# Patient Record
Sex: Female | Born: 1945
Health system: Southern US, Community
[De-identification: ages and names within clinical notes are randomized; demographics above are authoritative.]

## PROBLEM LIST (undated history)

## (undated) DIAGNOSIS — M1990 Unspecified osteoarthritis, unspecified site: Secondary | ICD-10-CM

## (undated) DIAGNOSIS — J45909 Unspecified asthma, uncomplicated: Secondary | ICD-10-CM

## (undated) DIAGNOSIS — K219 Gastro-esophageal reflux disease without esophagitis: Secondary | ICD-10-CM

## (undated) DIAGNOSIS — F32A Depression, unspecified: Secondary | ICD-10-CM

## (undated) DIAGNOSIS — I10 Essential (primary) hypertension: Secondary | ICD-10-CM

## (undated) DIAGNOSIS — E079 Disorder of thyroid, unspecified: Secondary | ICD-10-CM

## (undated) DIAGNOSIS — F329 Major depressive disorder, single episode, unspecified: Secondary | ICD-10-CM

## (undated) DIAGNOSIS — M797 Fibromyalgia: Secondary | ICD-10-CM

## (undated) DIAGNOSIS — E039 Hypothyroidism, unspecified: Secondary | ICD-10-CM

## (undated) DIAGNOSIS — J449 Chronic obstructive pulmonary disease, unspecified: Secondary | ICD-10-CM

## (undated) HISTORY — PX: NECK SURGERY: SHX720

## (undated) HISTORY — PX: ABDOMINAL HYSTERECTOMY: SHX81

## (undated) HISTORY — PX: CARPAL TUNNEL RELEASE: SHX101

## (undated) HISTORY — PX: KNEE ARTHROSCOPY: SUR90

## (undated) HISTORY — PX: CHOLECYSTECTOMY: SHX55

## (undated) HISTORY — PX: EYE SURGERY: SHX253

## (undated) HISTORY — PX: BACK SURGERY: SHX140

## (undated) HISTORY — PX: TUBAL LIGATION: SHX77

---

## 2000-05-20 ENCOUNTER — Encounter: Payer: Self-pay | Admitting: Neurological Surgery

## 2000-05-24 ENCOUNTER — Observation Stay (HOSPITAL_COMMUNITY): Admission: RE | Admit: 2000-05-24 | Discharge: 2000-05-25 | Payer: Self-pay | Admitting: Neurological Surgery

## 2000-05-24 ENCOUNTER — Encounter: Payer: Self-pay | Admitting: Neurological Surgery

## 2000-06-10 ENCOUNTER — Encounter: Payer: Self-pay | Admitting: Neurological Surgery

## 2000-06-10 ENCOUNTER — Encounter: Admission: RE | Admit: 2000-06-10 | Discharge: 2000-06-10 | Payer: Self-pay | Admitting: Neurological Surgery

## 2007-03-24 ENCOUNTER — Ambulatory Visit: Payer: Self-pay | Admitting: Cardiology

## 2008-11-09 ENCOUNTER — Emergency Department (HOSPITAL_COMMUNITY): Admission: EM | Admit: 2008-11-09 | Discharge: 2008-11-09 | Payer: Self-pay | Admitting: Emergency Medicine

## 2009-11-21 ENCOUNTER — Ambulatory Visit (HOSPITAL_COMMUNITY)
Admission: RE | Admit: 2009-11-21 | Discharge: 2009-11-21 | Payer: Self-pay | Source: Home / Self Care | Admitting: Neurological Surgery

## 2010-04-09 ENCOUNTER — Ambulatory Visit (HOSPITAL_COMMUNITY)
Admission: RE | Admit: 2010-04-09 | Discharge: 2010-04-09 | Payer: Self-pay | Source: Home / Self Care | Attending: Gastroenterology | Admitting: Gastroenterology

## 2010-07-29 NOTE — Assessment & Plan Note (Signed)
[facility]                          [facility] NOTE   NAME:Alexis Ortega, Alexis Ortega                       MRN:          [id number]  DATE:03/24/2007                            DOB:          August 15, 1945    HISTORY OF PRESENT ILLNESS:  The patient is Ortega pleasant 65 year old  female who came in with her mother yesterday in the clinic and was  complaining of chest pain, please see my brief dictated note regarding  his visit yesterday.  In essence, the patient states that over the last  2 weeks she has had increased shortness of breath which has been  particularly associated with coughing although it has been non-purulent  with Ortega dry hacking cough.  She stated that several weeks ago she did run  Ortega low grade fever and over the last 2 days had some chills.  She reports  left-sided chest pain associated with cough but yesterday had an episode  of substernal chest pain which was persistent for several hours, see my  note yesterday.  An EKG was obtained during this episode and there were  no electrocardiographic changes.  The patient does state that since  Christmas she has been feeling rather weak and has had decreased  exercise tolerance.  She did stop smoking around that time and is now  wearing Ortega Nicoderm patch.  She has no orthopnea or PND.  She has  otherwise no palpitations or syncope.   ALLERGIES:  NO KNOWN DRUG ALLERGIES.   MEDICATIONS:  1. Levothyroxine 100 mcg p.o. daily.  2. Citalopram 20 mg p.o. daily.  3. Exforge 5/160 mg p.o. daily.  4. Ranitidine 300 mg p.o. daily.  5. Aspirin 81 mg Ortega day.  6. Calcium.  7. Vitamin E.   PAST MEDICAL HISTORY:  1. Hypertension.  2. Arthritis.  3. Thyroid disease.  4. Anxiety.  5. Multiple surgeries including back surgery, right carpal tunnel      release,  BTL and partial hysterectomy as well as cholecystectomy.   FAMILY HISTORY:  Notable for her father died from myocardial infarction,  mother is alive  but has coronary artery disease and has diabetes  mellitus.  She has 2 brothers, both have coronary artery disease.   SOCIAL HISTORY:  The patient lives in [place].  She stopped smoking around  Christmas.  She is currently unemployed.   REVIEW OF SYSTEMS:  As per HPI.  No nausea, vomiting, no melena or  hematochezia, no dysuria or frequency.  No syncope, no neurological  symptoms.  Remainder of review of systems is positive as outlined above.   PHYSICAL EXAMINATION:  VITAL SIGNS:  Blood pressure is 103/64, heart  rate is 65 beats per minute.  NECK:  Normal carotid upstroke, no carotid bruits.  LUNGS:  Diminished breath sounds bilaterally.  HEART:  Regular rate and rhythm, normal S1-S2.  ABDOMEN:  Soft, nontender, no rebound or guarding.  Good bowel sounds.  EXTREMITIES:  No cyanosis, clubbing or edema.  NEURO:  Patient alert, oriented and grossly nonfocal.   Chest x-ray done yesterday demonstrated changes consistent with COPD but  no acute infiltrate.  Ortega 12 lead electrocardiogram done yesterday during  her pain episode showed no acute changes.   PROBLEM LIST:  1. Atypical chest pain, likely musculoskeletal  2. Chronic obstructive pulmonary disease, possible bronchitis.  3. Rule out ischemic heart disease (multiple cardiac risk factors).  4. History of hypothyroidism.  5. History of gastroesophageal reflux disease.  6. History of hypertension, stable.   PLAN:  1. The patient's chest pains is atypical.  She has 2 chest pain      syndromes.  She refers to Ortega left-sided chest pain which is clearly      worse with coughing but also had Ortega pain episode yesterday which was      persistent but nonexertional related.  Given her risk factor      profile we will proceed with an exercise stress test which can be      done in the next couple of days.  I have also given the patient      prescription for p.r.n. nitroglycerin.  2. The patient's left-sided chest pain appears to be musculoskeletal       and is likely related to Ortega recent episode of bronchitis.  The      patient does have COPD by chest x-ray and we will go ahead and      treat her with antibiotics including trimethoprim sulfamethoxazole      with an inhaler, Atrovent as well as Motrin p.r.n. for      musculoskeletal pain.  3. The patient can follow up with [place] in the next 3 months or earlier      if she has any questions or concerns.     Learta Codding, MD,FACC  Electronically Signed    GED/MedQ  DD: 03/24/2007  DT: 03/24/2007  Job #: [id number]   cc:   Doreen Beam, MD

## 2010-07-29 NOTE — Assessment & Plan Note (Signed)
[facility]                          [facility] NOTE   NAME:Alexis Ortega, Alexis Ortega                       MRN:          [id number]  DATE:03/23/2007                            DOB:          [birth year]    The patient is daughter of Mrs. [name] whom I saw in the office  today.  The patient states that, for the last several days, she has not  been feeling well.  She has been feeling weak.  Has had a cough and is  complaining of some left-sided chest pain, which appears to be worse  with deep inspiration, less so with exertion.  The patient is concerned  about this and has to tried to call Dr. Sherril Croon but could not secure an  appointment in the office.  She mentioned all this to me while I was  examining her mother.  We did obtain an electrocardiogram which was  entirely normal with a normal sinus rhythm and no EKG changes suggestive  of ischemia.  I gave the patient the option to see me in the office  tomorrow or if she has worsening problems tonight, to be seen in the  emergency room.  The patient has given preference to be seen at the  [facility] Emergency Room.  Given her symptomatology, however, we will  proceed with a chest x-ray today and then further evaluation in the  morning.  Again I have explained to the patient very carefully that she  if is uncomfortable and has worsening pain, she needs to be seen in  emergency room.     Learta Codding, MD,FACC  Electronically Signed    GED/MedQ  DD: 03/23/2007  DT: 03/23/2007  Job #: [id number]

## 2010-08-01 NOTE — Op Note (Signed)
. [facility]  Patient:    Alexis Ortega, Alexis Ortega                       MRN: [id number] Proc. Date: 05/24/00 Adm. Date:  [id number] Attending:  Jonne Ply                           Operative Report  PREOPERATIVE DIAGNOSIS:  C6-7 spondylosis and herniated nucleus pulposus with left cervical radiculopathy.  POSTOPERATIVE DIAGNOSIS:  C6-7 spondylosis and herniated nucleus pulposus with left cervical radiculopathy.  PROCEDURE:  Anterior cervical diskectomy and arthrodesis C6-7, structural allograft, Synthes fixation.  SURGEON:  Stefani Dama, M.D.  FIRST ASSISTANT:  Danae Orleans. Venetia Maxon, M.D.  ANESTHESIA:  General endotracheal.  INDICATIONS:  The patient is a 65 year old individual who has had significant cervical neck pain and left shoulder and arm pain.  She has a C7 radiculopathy clinically and has a spondylitic disk with a small subligamentous herniation at that level.  DESCRIPTION OF PROCEDURE:  The patient was brought to the operating room and placed on the table in supine position.  After a smooth induction of general endotracheal anesthesia, she was placed in five pounds of Holter traction. The neck was prepped with Duraprep and draped in a sterile fashion.  A transverse incision was made in the left side of the neck at the base and carried down through the platysma.  The plane between the sternocleidomastoid and the strap muscles was dissected bluntly until the prevertebral space was reached.  The first identifiable disk space was noted to be that of C6-7. This was localized positively on radiograph.  A diskectomy was then performed, opening the anterior longitudinal ligament, using some rongeurs to remove some ventral osteophytes.  The disk was noted to be severely desiccated, and its removal occurred easily.  A self-retaining disk spreader was placed in the wound, and then the posterior longitudinal ligament area was cleared.  On  the right side, there was noted to be a significant lateral osteophyte.  This was cleared with an Anspach drill and a 2.3 mm matchstick dissector.  The lateral recess was cleared completely on the right side, and then attention was turned to the left side, where there was a subligamentous disk herniation noted. This was resected, the ligament was opened, the lateral recess was cleared similarly, and a moderate-sized uncinate process spur was removed.  Once this was cleared, a 7 mm tricortical iliac crest graft was fashioned into the appropriate size and shape and then placed into the interspace.  The traction was removed, and the neck was placed in slight flexion.  An 18 mm standard Synthes plate was affixed with four locking 4 x 14 mm screws.  The area was irrigated copiously with antibiotic irrigating solution.  Localizing radiograph identified good position of the graft and the fixation, and then the platysma was closed with 3-0 Vicryl in interrupted fashion, and 3-0 Vicryl was used to close the subcuticular skin.  The patient tolerated the procedure well and was returned to the recovery room in stable condition. DD:  05/24/00 TD:  05/25/00 Job: [id number]

## 2011-03-18 DIAGNOSIS — M79609 Pain in unspecified limb: Secondary | ICD-10-CM | POA: Diagnosis not present

## 2011-03-18 DIAGNOSIS — IMO0002 Reserved for concepts with insufficient information to code with codable children: Secondary | ICD-10-CM | POA: Diagnosis not present

## 2011-04-27 DIAGNOSIS — S8000XA Contusion of unspecified knee, initial encounter: Secondary | ICD-10-CM | POA: Diagnosis not present

## 2011-04-27 DIAGNOSIS — M5430 Sciatica, unspecified side: Secondary | ICD-10-CM | POA: Diagnosis not present

## 2011-04-29 DIAGNOSIS — M5430 Sciatica, unspecified side: Secondary | ICD-10-CM | POA: Diagnosis not present

## 2011-05-08 DIAGNOSIS — IMO0002 Reserved for concepts with insufficient information to code with codable children: Secondary | ICD-10-CM | POA: Diagnosis not present

## 2011-07-08 DIAGNOSIS — I10 Essential (primary) hypertension: Secondary | ICD-10-CM | POA: Diagnosis not present

## 2011-07-08 DIAGNOSIS — R350 Frequency of micturition: Secondary | ICD-10-CM | POA: Diagnosis not present

## 2011-07-08 DIAGNOSIS — R82998 Other abnormal findings in urine: Secondary | ICD-10-CM | POA: Diagnosis not present

## 2011-07-08 DIAGNOSIS — L905 Scar conditions and fibrosis of skin: Secondary | ICD-10-CM | POA: Diagnosis not present

## 2011-07-08 DIAGNOSIS — D485 Neoplasm of uncertain behavior of skin: Secondary | ICD-10-CM | POA: Diagnosis not present

## 2011-07-08 DIAGNOSIS — L821 Other seborrheic keratosis: Secondary | ICD-10-CM | POA: Diagnosis not present

## 2011-07-08 DIAGNOSIS — E039 Hypothyroidism, unspecified: Secondary | ICD-10-CM | POA: Diagnosis not present

## 2011-07-08 DIAGNOSIS — D179 Benign lipomatous neoplasm, unspecified: Secondary | ICD-10-CM | POA: Diagnosis not present

## 2011-07-15 DIAGNOSIS — F17200 Nicotine dependence, unspecified, uncomplicated: Secondary | ICD-10-CM | POA: Diagnosis not present

## 2011-07-15 DIAGNOSIS — M899 Disorder of bone, unspecified: Secondary | ICD-10-CM | POA: Diagnosis not present

## 2011-07-15 DIAGNOSIS — F341 Dysthymic disorder: Secondary | ICD-10-CM | POA: Diagnosis not present

## 2011-07-15 DIAGNOSIS — Z0000 Encounter for general adult medical examination without abnormal findings: Secondary | ICD-10-CM | POA: Diagnosis not present

## 2011-07-15 DIAGNOSIS — I10 Essential (primary) hypertension: Secondary | ICD-10-CM | POA: Diagnosis not present

## 2011-09-14 DIAGNOSIS — E039 Hypothyroidism, unspecified: Secondary | ICD-10-CM | POA: Diagnosis not present

## 2011-10-07 DIAGNOSIS — M999 Biomechanical lesion, unspecified: Secondary | ICD-10-CM | POA: Diagnosis not present

## 2011-10-07 DIAGNOSIS — M5430 Sciatica, unspecified side: Secondary | ICD-10-CM | POA: Diagnosis not present

## 2011-10-07 DIAGNOSIS — S335XXA Sprain of ligaments of lumbar spine, initial encounter: Secondary | ICD-10-CM | POA: Diagnosis not present

## 2011-10-08 DIAGNOSIS — M999 Biomechanical lesion, unspecified: Secondary | ICD-10-CM | POA: Diagnosis not present

## 2011-10-08 DIAGNOSIS — S335XXA Sprain of ligaments of lumbar spine, initial encounter: Secondary | ICD-10-CM | POA: Diagnosis not present

## 2011-10-08 DIAGNOSIS — R42 Dizziness and giddiness: Secondary | ICD-10-CM | POA: Diagnosis not present

## 2011-10-08 DIAGNOSIS — M5430 Sciatica, unspecified side: Secondary | ICD-10-CM | POA: Diagnosis not present

## 2011-10-08 DIAGNOSIS — IMO0002 Reserved for concepts with insufficient information to code with codable children: Secondary | ICD-10-CM | POA: Diagnosis not present

## 2011-10-08 DIAGNOSIS — I10 Essential (primary) hypertension: Secondary | ICD-10-CM | POA: Diagnosis not present

## 2011-10-08 DIAGNOSIS — F341 Dysthymic disorder: Secondary | ICD-10-CM | POA: Diagnosis not present

## 2011-10-12 ENCOUNTER — Encounter (HOSPITAL_COMMUNITY): Payer: Self-pay | Admitting: *Deleted

## 2011-10-12 ENCOUNTER — Emergency Department (HOSPITAL_COMMUNITY): Payer: Medicare Other

## 2011-10-12 ENCOUNTER — Emergency Department (HOSPITAL_COMMUNITY)
Admission: EM | Admit: 2011-10-12 | Discharge: 2011-10-12 | Disposition: A | Discharge status: Home / Self Care | Payer: Medicare Other | Attending: Emergency Medicine | Admitting: Emergency Medicine

## 2011-10-12 DIAGNOSIS — M549 Dorsalgia, unspecified: Secondary | ICD-10-CM

## 2011-10-12 DIAGNOSIS — R51 Headache: Secondary | ICD-10-CM | POA: Diagnosis not present

## 2011-10-12 DIAGNOSIS — R42 Dizziness and giddiness: Secondary | ICD-10-CM | POA: Diagnosis not present

## 2011-10-12 DIAGNOSIS — R079 Chest pain, unspecified: Secondary | ICD-10-CM | POA: Diagnosis not present

## 2011-10-12 DIAGNOSIS — R0789 Other chest pain: Secondary | ICD-10-CM | POA: Diagnosis not present

## 2011-10-12 DIAGNOSIS — R030 Elevated blood-pressure reading, without diagnosis of hypertension: Secondary | ICD-10-CM | POA: Insufficient documentation

## 2011-10-12 DIAGNOSIS — M545 Low back pain: Secondary | ICD-10-CM | POA: Diagnosis not present

## 2011-10-12 HISTORY — DX: Disorder of thyroid, unspecified: E07.9

## 2011-10-12 HISTORY — DX: Essential (primary) hypertension: I10

## 2011-10-12 LAB — POCT I-STAT TROPONIN I: Troponin i, poc: 0 ng/mL (ref 0.00–0.08)

## 2011-10-12 LAB — CBC WITH DIFFERENTIAL/PLATELET
Basophils Absolute: 0.1 10*3/uL (ref 0.0–0.1)
Basophils Relative: 1 % (ref 0–1)
Eosinophils Absolute: 0.3 10*3/uL (ref 0.0–0.7)
Eosinophils Relative: 4 % (ref 0–5)
HCT: 38.4 % (ref 36.0–46.0)
Lymphocytes Relative: 36 % (ref 12–46)
MCH: 31.7 pg (ref 26.0–34.0)
MCHC: 34.6 g/dL (ref 30.0–36.0)
MCV: 91.4 fL (ref 78.0–100.0)
Monocytes Absolute: 0.7 10*3/uL (ref 0.1–1.0)
RDW: 12.9 % (ref 11.5–15.5)

## 2011-10-12 LAB — BASIC METABOLIC PANEL
CO2: 28 mEq/L (ref 19–32)
Calcium: 9.7 mg/dL (ref 8.4–10.5)
Creatinine, Ser: 0.71 mg/dL (ref 0.50–1.10)
GFR calc non Af Amer: 88 mL/min — ABNORMAL LOW (ref >90)

## 2011-10-12 MED ORDER — CLONIDINE HCL 0.1 MG PO TABS
0.1000 mg | ORAL_TABLET | Freq: Once | ORAL | Status: DC
  Filled 2011-10-12: qty 1

## 2011-10-12 MED ORDER — HYDROCODONE-ACETAMINOPHEN 5-325 MG PO TABS
1.0000 | ORAL_TABLET | Freq: Once | ORAL | Status: AC
  Administered 2011-10-12: 1 via ORAL
  Filled 2011-10-12: qty 1

## 2011-10-12 NOTE — ED Notes (Signed)
Pt reports her blood pressure has been high all week. Was seen by PCP & was told to doulble up on her bp medication. Pt reports chest pain on & off for the last 4 days. Denies any cp now.

## 2011-10-12 NOTE — ED Notes (Signed)
Pt reports blood pressure being high & chest pains on & off.

## 2011-10-12 NOTE — ED Provider Notes (Signed)
History     CSN: [id number]  Arrival date & time 10/12/11  0048   First MD Initiated Contact with Patient 10/12/11 0053      Chief Complaint  Patient presents with  . Hypertension  . Chest Pain    (Consider location/radiation/quality/duration/timing/severity/associated sxs/prior treatment) HPI Alexis Ortega is a 66 y.o. female who presents to the Emergency Department complaining of back pain, headache, chest discomfort and elevated blood pressure. She has had back pain to the lower back and a headache for a week. Blood pressure has been high all week. She has seen her PCP who doubled her amlodipine. She has had intermittent chest pain associated with the headache and back pain. She currently has no chest pain.  PCP Dr. Sherryll Burger Neurosurgeon Dr. Danielle Dess   Past Medical History  Diagnosis Date  . Hypertension   . Thyroid disease     Past Surgical History  Procedure Date  . Back surgery   . Abdominal hysterectomy   . Cholecystectomy   . Tubal ligation     History reviewed. No pertinent family history.  History  Substance Use Topics  . Smoking status: Current Everyday Smoker -- 1.0 packs/day    Types: Cigarettes  . Smokeless tobacco: Not on file  . Alcohol Use: No    OB History    Grav Para Term Preterm Abortions TAB SAB Ect Mult Living                  Review of Systems  Constitutional: Negative for fever.       10 Systems reviewed and are negative for acute change except as noted in the HPI.  HENT: Negative for congestion.   Eyes: Negative for discharge and redness.  Respiratory: Positive for chest tightness. Negative for cough and shortness of breath.   Cardiovascular: Negative for chest pain.  Gastrointestinal: Negative for vomiting and abdominal pain.  Musculoskeletal: Positive for back pain.  Skin: Negative for rash.  Neurological: Positive for dizziness and headaches. Negative for syncope and numbness.  Psychiatric/Behavioral:       No behavior change.      Allergies  Tramadol  Home Medications   Current Outpatient Rx  Name Route Sig Dispense Refill  . AMLODIPINE BESYLATE 10 MG PO TABS Oral Take 5 mg by mouth 2 (two) times daily.    . ASPIRIN 81 MG PO TABS Oral Take 81 mg by mouth daily.    Marland Kitchen CITALOPRAM HYDROBROMIDE 20 MG PO TABS Oral Take 20 mg by mouth daily.    Marland Kitchen LEVOTHYROXINE SODIUM 75 MCG PO TABS Oral Take 75 mcg by mouth daily.    Marland Kitchen LOSARTAN POTASSIUM 100 MG PO TABS Oral Take 100 mg by mouth daily.      BP 158/72  Pulse 65  Temp 97.7 F (36.5 C) (Oral)  Resp 18  Ht 5\' 4"  (1.626 m)  Wt 159 lb (72.122 kg)  BMI 27.29 kg/m2  SpO2 100%  Physical Exam  Nursing note and vitals reviewed. Constitutional: She is oriented to person, place, and time. She appears well-developed and well-nourished.       Awake, alert, nontoxic appearance, anxious  HENT:  Head: Normocephalic and atraumatic.  Eyes: Conjunctivae and EOM are normal. Pupils are equal, round, and reactive to light. Right eye exhibits no discharge. Left eye exhibits no discharge.  Neck: Normal range of motion. Neck supple.  Cardiovascular: Normal rate, normal heart sounds and intact distal pulses.   Pulmonary/Chest: Effort normal and breath sounds normal. She  exhibits no tenderness.  Abdominal: Soft. There is no tenderness. There is no rebound.  Genitourinary:       No cva tenderness to percussion  Musculoskeletal: She exhibits no tenderness.       Baseline ROM, no obvious new focal weakness.  Neurological: She is alert and oriented to person, place, and time.       Mental status and motor strength appears baseline for patient and situation.  Skin: Skin is warm and dry. No rash noted.  Psychiatric: She has a normal mood and affect.    ED Course  Procedures (including critical care time)  Results for orders placed during the hospital encounter of 10/12/11  CBC WITH DIFFERENTIAL      Component Value Range   WBC 7.3  4.0 - 10.5 K/uL   RBC 4.20  3.87 - 5.11 MIL/uL    Hemoglobin 13.3  12.0 - 15.0 g/dL   HCT 96.0  45.4 - 09.8 %   MCV 91.4  78.0 - 100.0 fL   MCH 31.7  26.0 - 34.0 pg   MCHC 34.6  30.0 - 36.0 g/dL   RDW 11.9  14.7 - 82.9 %   Platelets 262  150 - 400 K/uL   Neutrophils Relative 50  43 - 77 %   Neutro Abs 3.7  1.7 - 7.7 K/uL   Lymphocytes Relative 36  12 - 46 %   Lymphs Abs 2.7  0.7 - 4.0 K/uL   Monocytes Relative 9  3 - 12 %   Monocytes Absolute 0.7  0.1 - 1.0 K/uL   Eosinophils Relative 4  0 - 5 %   Eosinophils Absolute 0.3  0.0 - 0.7 K/uL   Basophils Relative 1  0 - 1 %   Basophils Absolute 0.1  0.0 - 0.1 K/uL  BASIC METABOLIC PANEL      Component Value Range   Sodium 134 (*) 135 - 145 mEq/L   Potassium 3.9  3.5 - 5.1 mEq/L   Chloride 99  96 - 112 mEq/L   CO2 28  19 - 32 mEq/L   Glucose, Bld 119 (*) 70 - 99 mg/dL   BUN 7  6 - 23 mg/dL   Creatinine, Ser 5.62  0.50 - 1.10 mg/dL   Calcium 9.7  8.4 - 13.0 mg/dL   GFR calc non Af Amer 88 (*) >90 mL/min   GFR calc Af Amer >90  >90 mL/min  POCT I-STAT TROPONIN I      Component Value Range   Troponin i, poc 0.00  0.00 - 0.08 ng/mL   Comment 3              Date: 10/12/2011  0101  Rate: 62  Rhythm: normal sinus rhythm  QRS Axis: normal  Intervals: normal  ST/T Wave abnormalities: normal  Conduction Disutrbances: none  Narrative Interpretation: unremarkable  Dg Chest Portable 1 View  10/12/2011  *RADIOLOGY REPORT*  Clinical Data: History of hypertension.  Elevated blood pressure this week.  Chest pain.  PORTABLE CHEST - 1 VIEW  Comparison: None  Findings: Heart size is accentuated by the portable technique.  No focal consolidations or pleural effusions.  No evidence for pulmonary edema.  Patient has had prior lower cervical fusion.  IMPRESSION: No evidence for acute cardiopulmonary abnormality.  Original Report Authenticated By: Patterson Hammersmith, M.D.    [phone] Orthostatics are negative. Blood pressure has improved since arrival without intervention. Headache is  improving.   MDM   Patient who presents  with c/o headache, back pain, elevated blood pressure. Labs are unremarkable. EKG and chest xray negative. Troponin negative. Patient has had resolution of headache. Dx testing d/w pt .  Questions answered.  Verb understanding, agreeable to d/c home with outpt f/u. Pt feels improved after observation and/or treatment in ED.Pt stable in ED with no significant deterioration in condition.The patient appears reasonably screened and/or stabilized for discharge and I doubt any other medical condition or other [facility] requiring further screening, evaluation, or treatment in the ED at this time prior to discharge.  MDM Reviewed: nursing note and vitals Interpretation: labs, ECG and x-ray           Nicoletta Dress. Colon Branch, MD 10/12/11 [phone]

## 2011-10-12 NOTE — ED Notes (Signed)
Pt alert & oriented x4, stable gait. Patient given discharge instructions, paperwork. Patient instructed to stop at the registration desk to finish any additional paperwork. Patient verbalized understanding. Pt left department w/ no further questions.  

## 2011-10-13 DIAGNOSIS — F341 Dysthymic disorder: Secondary | ICD-10-CM | POA: Diagnosis not present

## 2011-10-13 DIAGNOSIS — IMO0002 Reserved for concepts with insufficient information to code with codable children: Secondary | ICD-10-CM | POA: Diagnosis not present

## 2011-10-13 DIAGNOSIS — I10 Essential (primary) hypertension: Secondary | ICD-10-CM | POA: Diagnosis not present

## 2011-10-15 DIAGNOSIS — M5430 Sciatica, unspecified side: Secondary | ICD-10-CM | POA: Diagnosis not present

## 2011-10-15 DIAGNOSIS — S335XXA Sprain of ligaments of lumbar spine, initial encounter: Secondary | ICD-10-CM | POA: Diagnosis not present

## 2011-10-15 DIAGNOSIS — M999 Biomechanical lesion, unspecified: Secondary | ICD-10-CM | POA: Diagnosis not present

## 2011-10-16 DIAGNOSIS — M999 Biomechanical lesion, unspecified: Secondary | ICD-10-CM | POA: Diagnosis not present

## 2011-10-16 DIAGNOSIS — S335XXA Sprain of ligaments of lumbar spine, initial encounter: Secondary | ICD-10-CM | POA: Diagnosis not present

## 2011-10-16 DIAGNOSIS — M5430 Sciatica, unspecified side: Secondary | ICD-10-CM | POA: Diagnosis not present

## 2011-10-19 DIAGNOSIS — M5430 Sciatica, unspecified side: Secondary | ICD-10-CM | POA: Diagnosis not present

## 2011-10-19 DIAGNOSIS — M999 Biomechanical lesion, unspecified: Secondary | ICD-10-CM | POA: Diagnosis not present

## 2011-10-19 DIAGNOSIS — S335XXA Sprain of ligaments of lumbar spine, initial encounter: Secondary | ICD-10-CM | POA: Diagnosis not present

## 2011-10-21 DIAGNOSIS — S335XXA Sprain of ligaments of lumbar spine, initial encounter: Secondary | ICD-10-CM | POA: Diagnosis not present

## 2011-10-21 DIAGNOSIS — M5430 Sciatica, unspecified side: Secondary | ICD-10-CM | POA: Diagnosis not present

## 2011-10-21 DIAGNOSIS — M999 Biomechanical lesion, unspecified: Secondary | ICD-10-CM | POA: Diagnosis not present

## 2011-10-23 DIAGNOSIS — M999 Biomechanical lesion, unspecified: Secondary | ICD-10-CM | POA: Diagnosis not present

## 2011-10-23 DIAGNOSIS — S335XXA Sprain of ligaments of lumbar spine, initial encounter: Secondary | ICD-10-CM | POA: Diagnosis not present

## 2011-10-23 DIAGNOSIS — M5430 Sciatica, unspecified side: Secondary | ICD-10-CM | POA: Diagnosis not present

## 2011-10-26 DIAGNOSIS — S335XXA Sprain of ligaments of lumbar spine, initial encounter: Secondary | ICD-10-CM | POA: Diagnosis not present

## 2011-10-26 DIAGNOSIS — M5430 Sciatica, unspecified side: Secondary | ICD-10-CM | POA: Diagnosis not present

## 2011-10-26 DIAGNOSIS — M999 Biomechanical lesion, unspecified: Secondary | ICD-10-CM | POA: Diagnosis not present

## 2011-10-28 DIAGNOSIS — M999 Biomechanical lesion, unspecified: Secondary | ICD-10-CM | POA: Diagnosis not present

## 2011-10-28 DIAGNOSIS — S335XXA Sprain of ligaments of lumbar spine, initial encounter: Secondary | ICD-10-CM | POA: Diagnosis not present

## 2011-10-28 DIAGNOSIS — M5430 Sciatica, unspecified side: Secondary | ICD-10-CM | POA: Diagnosis not present

## 2011-11-04 DIAGNOSIS — M545 Low back pain, unspecified: Secondary | ICD-10-CM | POA: Diagnosis not present

## 2011-11-04 DIAGNOSIS — M25559 Pain in unspecified hip: Secondary | ICD-10-CM | POA: Diagnosis not present

## 2011-11-11 DIAGNOSIS — M999 Biomechanical lesion, unspecified: Secondary | ICD-10-CM | POA: Diagnosis not present

## 2011-11-11 DIAGNOSIS — S335XXA Sprain of ligaments of lumbar spine, initial encounter: Secondary | ICD-10-CM | POA: Diagnosis not present

## 2011-11-11 DIAGNOSIS — M5430 Sciatica, unspecified side: Secondary | ICD-10-CM | POA: Diagnosis not present

## 2011-12-17 DIAGNOSIS — M549 Dorsalgia, unspecified: Secondary | ICD-10-CM | POA: Diagnosis not present

## 2011-12-17 DIAGNOSIS — IMO0001 Reserved for inherently not codable concepts without codable children: Secondary | ICD-10-CM | POA: Diagnosis not present

## 2011-12-24 DIAGNOSIS — D3500 Benign neoplasm of unspecified adrenal gland: Secondary | ICD-10-CM | POA: Diagnosis not present

## 2011-12-24 DIAGNOSIS — M5126 Other intervertebral disc displacement, lumbar region: Secondary | ICD-10-CM | POA: Diagnosis not present

## 2011-12-24 DIAGNOSIS — M5380 Other specified dorsopathies, site unspecified: Secondary | ICD-10-CM | POA: Diagnosis not present

## 2011-12-24 DIAGNOSIS — R29898 Other symptoms and signs involving the musculoskeletal system: Secondary | ICD-10-CM | POA: Diagnosis not present

## 2011-12-24 DIAGNOSIS — M4804 Spinal stenosis, thoracic region: Secondary | ICD-10-CM | POA: Diagnosis not present

## 2011-12-24 DIAGNOSIS — M48061 Spinal stenosis, lumbar region without neurogenic claudication: Secondary | ICD-10-CM | POA: Diagnosis not present

## 2011-12-29 ENCOUNTER — Other Ambulatory Visit: Payer: Self-pay | Admitting: Orthopedic Surgery

## 2011-12-29 DIAGNOSIS — M25562 Pain in left knee: Secondary | ICD-10-CM

## 2011-12-29 DIAGNOSIS — M25569 Pain in unspecified knee: Secondary | ICD-10-CM | POA: Diagnosis not present

## 2011-12-30 ENCOUNTER — Ambulatory Visit
Admission: RE | Admit: 2011-12-30 | Discharge: 2011-12-30 | Disposition: A | Discharge status: Home / Self Care | Payer: No Typology Code available for payment source | Source: Ambulatory Visit | Attending: Orthopedic Surgery | Admitting: Orthopedic Surgery

## 2011-12-30 DIAGNOSIS — M1710 Unilateral primary osteoarthritis, unspecified knee: Secondary | ICD-10-CM | POA: Diagnosis not present

## 2011-12-30 DIAGNOSIS — M25562 Pain in left knee: Secondary | ICD-10-CM

## 2011-12-30 DIAGNOSIS — Z23 Encounter for immunization: Secondary | ICD-10-CM | POA: Diagnosis not present

## 2011-12-31 DIAGNOSIS — M5430 Sciatica, unspecified side: Secondary | ICD-10-CM | POA: Diagnosis not present

## 2011-12-31 DIAGNOSIS — M47817 Spondylosis without myelopathy or radiculopathy, lumbosacral region: Secondary | ICD-10-CM | POA: Diagnosis not present

## 2011-12-31 DIAGNOSIS — M999 Biomechanical lesion, unspecified: Secondary | ICD-10-CM | POA: Diagnosis not present

## 2012-01-05 DIAGNOSIS — M25569 Pain in unspecified knee: Secondary | ICD-10-CM | POA: Diagnosis not present

## 2012-01-11 DIAGNOSIS — M2240 Chondromalacia patellae, unspecified knee: Secondary | ICD-10-CM | POA: Diagnosis not present

## 2012-01-11 DIAGNOSIS — M238X9 Other internal derangements of unspecified knee: Secondary | ICD-10-CM | POA: Diagnosis not present

## 2012-01-11 DIAGNOSIS — M9420 Chondromalacia, unspecified site: Secondary | ICD-10-CM | POA: Diagnosis not present

## 2012-01-11 DIAGNOSIS — M23302 Other meniscus derangements, unspecified lateral meniscus, unspecified knee: Secondary | ICD-10-CM | POA: Diagnosis not present

## 2012-01-11 DIAGNOSIS — S83289A Other tear of lateral meniscus, current injury, unspecified knee, initial encounter: Secondary | ICD-10-CM | POA: Diagnosis not present

## 2012-01-11 DIAGNOSIS — M2340 Loose body in knee, unspecified knee: Secondary | ICD-10-CM | POA: Diagnosis not present

## 2012-01-20 DIAGNOSIS — R262 Difficulty in walking, not elsewhere classified: Secondary | ICD-10-CM | POA: Diagnosis not present

## 2012-01-20 DIAGNOSIS — M25569 Pain in unspecified knee: Secondary | ICD-10-CM | POA: Diagnosis not present

## 2012-01-20 DIAGNOSIS — M6281 Muscle weakness (generalized): Secondary | ICD-10-CM | POA: Diagnosis not present

## 2012-01-20 DIAGNOSIS — IMO0001 Reserved for inherently not codable concepts without codable children: Secondary | ICD-10-CM | POA: Diagnosis not present

## 2012-01-22 DIAGNOSIS — R262 Difficulty in walking, not elsewhere classified: Secondary | ICD-10-CM | POA: Diagnosis not present

## 2012-01-22 DIAGNOSIS — IMO0001 Reserved for inherently not codable concepts without codable children: Secondary | ICD-10-CM | POA: Diagnosis not present

## 2012-01-22 DIAGNOSIS — M6281 Muscle weakness (generalized): Secondary | ICD-10-CM | POA: Diagnosis not present

## 2012-01-22 DIAGNOSIS — M25569 Pain in unspecified knee: Secondary | ICD-10-CM | POA: Diagnosis not present

## 2012-01-25 DIAGNOSIS — M25569 Pain in unspecified knee: Secondary | ICD-10-CM | POA: Diagnosis not present

## 2012-01-25 DIAGNOSIS — R262 Difficulty in walking, not elsewhere classified: Secondary | ICD-10-CM | POA: Diagnosis not present

## 2012-01-25 DIAGNOSIS — IMO0001 Reserved for inherently not codable concepts without codable children: Secondary | ICD-10-CM | POA: Diagnosis not present

## 2012-01-25 DIAGNOSIS — M6281 Muscle weakness (generalized): Secondary | ICD-10-CM | POA: Diagnosis not present

## 2012-01-27 DIAGNOSIS — IMO0001 Reserved for inherently not codable concepts without codable children: Secondary | ICD-10-CM | POA: Diagnosis not present

## 2012-01-27 DIAGNOSIS — R262 Difficulty in walking, not elsewhere classified: Secondary | ICD-10-CM | POA: Diagnosis not present

## 2012-01-27 DIAGNOSIS — M6281 Muscle weakness (generalized): Secondary | ICD-10-CM | POA: Diagnosis not present

## 2012-01-27 DIAGNOSIS — M25569 Pain in unspecified knee: Secondary | ICD-10-CM | POA: Diagnosis not present

## 2012-01-29 DIAGNOSIS — R262 Difficulty in walking, not elsewhere classified: Secondary | ICD-10-CM | POA: Diagnosis not present

## 2012-01-29 DIAGNOSIS — IMO0001 Reserved for inherently not codable concepts without codable children: Secondary | ICD-10-CM | POA: Diagnosis not present

## 2012-01-29 DIAGNOSIS — M6281 Muscle weakness (generalized): Secondary | ICD-10-CM | POA: Diagnosis not present

## 2012-01-29 DIAGNOSIS — M25569 Pain in unspecified knee: Secondary | ICD-10-CM | POA: Diagnosis not present

## 2012-02-01 DIAGNOSIS — R262 Difficulty in walking, not elsewhere classified: Secondary | ICD-10-CM | POA: Diagnosis not present

## 2012-02-01 DIAGNOSIS — IMO0001 Reserved for inherently not codable concepts without codable children: Secondary | ICD-10-CM | POA: Diagnosis not present

## 2012-02-01 DIAGNOSIS — M25569 Pain in unspecified knee: Secondary | ICD-10-CM | POA: Diagnosis not present

## 2012-02-01 DIAGNOSIS — M6281 Muscle weakness (generalized): Secondary | ICD-10-CM | POA: Diagnosis not present

## 2012-02-03 DIAGNOSIS — R262 Difficulty in walking, not elsewhere classified: Secondary | ICD-10-CM | POA: Diagnosis not present

## 2012-02-03 DIAGNOSIS — M6281 Muscle weakness (generalized): Secondary | ICD-10-CM | POA: Diagnosis not present

## 2012-02-03 DIAGNOSIS — IMO0001 Reserved for inherently not codable concepts without codable children: Secondary | ICD-10-CM | POA: Diagnosis not present

## 2012-02-03 DIAGNOSIS — M25569 Pain in unspecified knee: Secondary | ICD-10-CM | POA: Diagnosis not present

## 2012-02-05 DIAGNOSIS — M25569 Pain in unspecified knee: Secondary | ICD-10-CM | POA: Diagnosis not present

## 2012-02-05 DIAGNOSIS — R262 Difficulty in walking, not elsewhere classified: Secondary | ICD-10-CM | POA: Diagnosis not present

## 2012-02-05 DIAGNOSIS — M6281 Muscle weakness (generalized): Secondary | ICD-10-CM | POA: Diagnosis not present

## 2012-02-05 DIAGNOSIS — IMO0001 Reserved for inherently not codable concepts without codable children: Secondary | ICD-10-CM | POA: Diagnosis not present

## 2012-02-08 DIAGNOSIS — R262 Difficulty in walking, not elsewhere classified: Secondary | ICD-10-CM | POA: Diagnosis not present

## 2012-02-08 DIAGNOSIS — IMO0001 Reserved for inherently not codable concepts without codable children: Secondary | ICD-10-CM | POA: Diagnosis not present

## 2012-02-08 DIAGNOSIS — M25569 Pain in unspecified knee: Secondary | ICD-10-CM | POA: Diagnosis not present

## 2012-02-08 DIAGNOSIS — M6281 Muscle weakness (generalized): Secondary | ICD-10-CM | POA: Diagnosis not present

## 2012-02-10 DIAGNOSIS — IMO0001 Reserved for inherently not codable concepts without codable children: Secondary | ICD-10-CM | POA: Diagnosis not present

## 2012-02-10 DIAGNOSIS — M25569 Pain in unspecified knee: Secondary | ICD-10-CM | POA: Diagnosis not present

## 2012-02-10 DIAGNOSIS — R262 Difficulty in walking, not elsewhere classified: Secondary | ICD-10-CM | POA: Diagnosis not present

## 2012-02-10 DIAGNOSIS — M6281 Muscle weakness (generalized): Secondary | ICD-10-CM | POA: Diagnosis not present

## 2012-02-15 DIAGNOSIS — R262 Difficulty in walking, not elsewhere classified: Secondary | ICD-10-CM | POA: Diagnosis not present

## 2012-02-15 DIAGNOSIS — M545 Low back pain: Secondary | ICD-10-CM | POA: Diagnosis not present

## 2012-02-15 DIAGNOSIS — M6281 Muscle weakness (generalized): Secondary | ICD-10-CM | POA: Diagnosis not present

## 2012-02-15 DIAGNOSIS — M25569 Pain in unspecified knee: Secondary | ICD-10-CM | POA: Diagnosis not present

## 2012-02-15 DIAGNOSIS — IMO0001 Reserved for inherently not codable concepts without codable children: Secondary | ICD-10-CM | POA: Diagnosis not present

## 2012-02-16 DIAGNOSIS — M549 Dorsalgia, unspecified: Secondary | ICD-10-CM | POA: Diagnosis not present

## 2012-02-16 DIAGNOSIS — E039 Hypothyroidism, unspecified: Secondary | ICD-10-CM | POA: Diagnosis not present

## 2012-02-17 DIAGNOSIS — M545 Low back pain: Secondary | ICD-10-CM | POA: Diagnosis not present

## 2012-02-17 DIAGNOSIS — IMO0001 Reserved for inherently not codable concepts without codable children: Secondary | ICD-10-CM | POA: Diagnosis not present

## 2012-02-17 DIAGNOSIS — M25569 Pain in unspecified knee: Secondary | ICD-10-CM | POA: Diagnosis not present

## 2012-02-17 DIAGNOSIS — R262 Difficulty in walking, not elsewhere classified: Secondary | ICD-10-CM | POA: Diagnosis not present

## 2012-02-17 DIAGNOSIS — M6281 Muscle weakness (generalized): Secondary | ICD-10-CM | POA: Diagnosis not present

## 2012-02-19 DIAGNOSIS — M6281 Muscle weakness (generalized): Secondary | ICD-10-CM | POA: Diagnosis not present

## 2012-02-19 DIAGNOSIS — R262 Difficulty in walking, not elsewhere classified: Secondary | ICD-10-CM | POA: Diagnosis not present

## 2012-02-19 DIAGNOSIS — IMO0001 Reserved for inherently not codable concepts without codable children: Secondary | ICD-10-CM | POA: Diagnosis not present

## 2012-02-19 DIAGNOSIS — M25569 Pain in unspecified knee: Secondary | ICD-10-CM | POA: Diagnosis not present

## 2012-02-19 DIAGNOSIS — M545 Low back pain: Secondary | ICD-10-CM | POA: Diagnosis not present

## 2012-02-22 DIAGNOSIS — M6281 Muscle weakness (generalized): Secondary | ICD-10-CM | POA: Diagnosis not present

## 2012-02-22 DIAGNOSIS — M545 Low back pain: Secondary | ICD-10-CM | POA: Diagnosis not present

## 2012-02-22 DIAGNOSIS — R262 Difficulty in walking, not elsewhere classified: Secondary | ICD-10-CM | POA: Diagnosis not present

## 2012-02-22 DIAGNOSIS — M25569 Pain in unspecified knee: Secondary | ICD-10-CM | POA: Diagnosis not present

## 2012-02-22 DIAGNOSIS — IMO0001 Reserved for inherently not codable concepts without codable children: Secondary | ICD-10-CM | POA: Diagnosis not present

## 2012-02-24 DIAGNOSIS — IMO0001 Reserved for inherently not codable concepts without codable children: Secondary | ICD-10-CM | POA: Diagnosis not present

## 2012-02-24 DIAGNOSIS — M25569 Pain in unspecified knee: Secondary | ICD-10-CM | POA: Diagnosis not present

## 2012-02-24 DIAGNOSIS — R262 Difficulty in walking, not elsewhere classified: Secondary | ICD-10-CM | POA: Diagnosis not present

## 2012-02-24 DIAGNOSIS — M6281 Muscle weakness (generalized): Secondary | ICD-10-CM | POA: Diagnosis not present

## 2012-02-24 DIAGNOSIS — M545 Low back pain: Secondary | ICD-10-CM | POA: Diagnosis not present

## 2012-02-26 DIAGNOSIS — M6281 Muscle weakness (generalized): Secondary | ICD-10-CM | POA: Diagnosis not present

## 2012-02-26 DIAGNOSIS — M545 Low back pain: Secondary | ICD-10-CM | POA: Diagnosis not present

## 2012-02-26 DIAGNOSIS — M25569 Pain in unspecified knee: Secondary | ICD-10-CM | POA: Diagnosis not present

## 2012-02-26 DIAGNOSIS — IMO0001 Reserved for inherently not codable concepts without codable children: Secondary | ICD-10-CM | POA: Diagnosis not present

## 2012-02-26 DIAGNOSIS — R262 Difficulty in walking, not elsewhere classified: Secondary | ICD-10-CM | POA: Diagnosis not present

## 2012-02-29 DIAGNOSIS — M25569 Pain in unspecified knee: Secondary | ICD-10-CM | POA: Diagnosis not present

## 2012-02-29 DIAGNOSIS — M6281 Muscle weakness (generalized): Secondary | ICD-10-CM | POA: Diagnosis not present

## 2012-02-29 DIAGNOSIS — R262 Difficulty in walking, not elsewhere classified: Secondary | ICD-10-CM | POA: Diagnosis not present

## 2012-02-29 DIAGNOSIS — M545 Low back pain: Secondary | ICD-10-CM | POA: Diagnosis not present

## 2012-02-29 DIAGNOSIS — IMO0001 Reserved for inherently not codable concepts without codable children: Secondary | ICD-10-CM | POA: Diagnosis not present

## 2012-03-02 DIAGNOSIS — IMO0001 Reserved for inherently not codable concepts without codable children: Secondary | ICD-10-CM | POA: Diagnosis not present

## 2012-03-02 DIAGNOSIS — M545 Low back pain: Secondary | ICD-10-CM | POA: Diagnosis not present

## 2012-03-02 DIAGNOSIS — M25569 Pain in unspecified knee: Secondary | ICD-10-CM | POA: Diagnosis not present

## 2012-03-02 DIAGNOSIS — M6281 Muscle weakness (generalized): Secondary | ICD-10-CM | POA: Diagnosis not present

## 2012-03-02 DIAGNOSIS — R262 Difficulty in walking, not elsewhere classified: Secondary | ICD-10-CM | POA: Diagnosis not present

## 2012-03-04 DIAGNOSIS — M25569 Pain in unspecified knee: Secondary | ICD-10-CM | POA: Diagnosis not present

## 2012-03-04 DIAGNOSIS — M545 Low back pain: Secondary | ICD-10-CM | POA: Diagnosis not present

## 2012-03-04 DIAGNOSIS — M6281 Muscle weakness (generalized): Secondary | ICD-10-CM | POA: Diagnosis not present

## 2012-03-04 DIAGNOSIS — R262 Difficulty in walking, not elsewhere classified: Secondary | ICD-10-CM | POA: Diagnosis not present

## 2012-03-04 DIAGNOSIS — IMO0001 Reserved for inherently not codable concepts without codable children: Secondary | ICD-10-CM | POA: Diagnosis not present

## 2012-04-14 DIAGNOSIS — M25559 Pain in unspecified hip: Secondary | ICD-10-CM | POA: Diagnosis not present

## 2012-04-18 DIAGNOSIS — Z13220 Encounter for screening for lipoid disorders: Secondary | ICD-10-CM | POA: Diagnosis not present

## 2012-04-18 DIAGNOSIS — S43429A Sprain of unspecified rotator cuff capsule, initial encounter: Secondary | ICD-10-CM | POA: Diagnosis not present

## 2012-04-18 DIAGNOSIS — E782 Mixed hyperlipidemia: Secondary | ICD-10-CM | POA: Diagnosis not present

## 2012-04-18 DIAGNOSIS — Z0000 Encounter for general adult medical examination without abnormal findings: Secondary | ICD-10-CM | POA: Diagnosis not present

## 2012-05-09 DIAGNOSIS — J40 Bronchitis, not specified as acute or chronic: Secondary | ICD-10-CM | POA: Diagnosis not present

## 2012-05-17 DIAGNOSIS — M2240 Chondromalacia patellae, unspecified knee: Secondary | ICD-10-CM | POA: Diagnosis not present

## 2012-05-19 ENCOUNTER — Encounter (HOSPITAL_COMMUNITY): Payer: Self-pay | Admitting: Pharmacy Technician

## 2012-05-19 DIAGNOSIS — J209 Acute bronchitis, unspecified: Secondary | ICD-10-CM | POA: Diagnosis not present

## 2012-05-27 ENCOUNTER — Encounter (HOSPITAL_COMMUNITY)
Admission: RE | Admit: 2012-05-27 | Discharge: 2012-05-27 | Disposition: A | Discharge status: Home / Self Care | Payer: Medicare Other | Source: Ambulatory Visit | Attending: Orthopedic Surgery | Admitting: Orthopedic Surgery

## 2012-05-27 ENCOUNTER — Encounter (HOSPITAL_COMMUNITY): Payer: Self-pay

## 2012-05-27 ENCOUNTER — Encounter (HOSPITAL_COMMUNITY): Admission: RE | Admit: 2012-05-27 | Payer: Medicare Other | Source: Ambulatory Visit

## 2012-05-27 DIAGNOSIS — Z471 Aftercare following joint replacement surgery: Secondary | ICD-10-CM | POA: Diagnosis not present

## 2012-05-27 DIAGNOSIS — Z8261 Family history of arthritis: Secondary | ICD-10-CM | POA: Diagnosis not present

## 2012-05-27 DIAGNOSIS — K219 Gastro-esophageal reflux disease without esophagitis: Secondary | ICD-10-CM | POA: Diagnosis not present

## 2012-05-27 DIAGNOSIS — Z96659 Presence of unspecified artificial knee joint: Secondary | ICD-10-CM | POA: Diagnosis not present

## 2012-05-27 DIAGNOSIS — Z8249 Family history of ischemic heart disease and other diseases of the circulatory system: Secondary | ICD-10-CM | POA: Diagnosis not present

## 2012-05-27 DIAGNOSIS — Z7901 Long term (current) use of anticoagulants: Secondary | ICD-10-CM | POA: Diagnosis not present

## 2012-05-27 DIAGNOSIS — Z79899 Other long term (current) drug therapy: Secondary | ICD-10-CM | POA: Diagnosis not present

## 2012-05-27 DIAGNOSIS — E039 Hypothyroidism, unspecified: Secondary | ICD-10-CM | POA: Diagnosis present

## 2012-05-27 DIAGNOSIS — I10 Essential (primary) hypertension: Secondary | ICD-10-CM | POA: Diagnosis not present

## 2012-05-27 DIAGNOSIS — Z833 Family history of diabetes mellitus: Secondary | ICD-10-CM | POA: Diagnosis not present

## 2012-05-27 DIAGNOSIS — M1710 Unilateral primary osteoarthritis, unspecified knee: Secondary | ICD-10-CM | POA: Diagnosis not present

## 2012-05-27 DIAGNOSIS — J45909 Unspecified asthma, uncomplicated: Secondary | ICD-10-CM | POA: Diagnosis present

## 2012-05-27 DIAGNOSIS — Z01812 Encounter for preprocedural laboratory examination: Secondary | ICD-10-CM | POA: Diagnosis not present

## 2012-05-27 DIAGNOSIS — D62 Acute posthemorrhagic anemia: Secondary | ICD-10-CM | POA: Diagnosis not present

## 2012-05-27 HISTORY — DX: Fibromyalgia: M79.7

## 2012-05-27 HISTORY — DX: Hypothyroidism, unspecified: E03.9

## 2012-05-27 HISTORY — DX: Depression, unspecified: F32.A

## 2012-05-27 HISTORY — DX: Major depressive disorder, single episode, unspecified: F32.9

## 2012-05-27 HISTORY — DX: Gastro-esophageal reflux disease without esophagitis: K21.9

## 2012-05-27 HISTORY — DX: Unspecified osteoarthritis, unspecified site: M19.90

## 2012-05-27 LAB — COMPREHENSIVE METABOLIC PANEL
ALT: 12 U/L (ref 0–35)
AST: 17 U/L (ref 0–37)
Albumin: 4.2 g/dL (ref 3.5–5.2)
Alkaline Phosphatase: 81 U/L (ref 39–117)
BUN: 13 mg/dL (ref 6–23)
Potassium: 4 mEq/L (ref 3.5–5.1)
Sodium: 136 mEq/L (ref 135–145)
Total Protein: 7.3 g/dL (ref 6.0–8.3)

## 2012-05-27 LAB — CBC
MCHC: 34.3 g/dL (ref 30.0–36.0)
Platelets: 301 10*3/uL (ref 150–400)
RDW: 12.7 % (ref 11.5–15.5)
WBC: 9.3 10*3/uL (ref 4.0–10.5)

## 2012-05-27 LAB — URINALYSIS, ROUTINE W REFLEX MICROSCOPIC
Bilirubin Urine: NEGATIVE
Hgb urine dipstick: NEGATIVE
Protein, ur: NEGATIVE mg/dL
Urobilinogen, UA: 0.2 mg/dL (ref 0.0–1.0)

## 2012-05-27 LAB — PROTIME-INR: Prothrombin Time: 13.4 seconds (ref 11.6–15.2)

## 2012-05-27 LAB — SURGICAL PCR SCREEN: MRSA, PCR: POSITIVE — AB

## 2012-05-27 LAB — APTT: aPTT: 32 seconds (ref 24–37)

## 2012-05-27 NOTE — Pre-Procedure Instructions (Signed)
Alexis Ortega  05/27/2012   Your procedure is scheduled on:  06-01-2012  Report to [facility] at 8:00 AM.  Call this number if you have problems the morning of surgery: [phone]   Remember:   Do not eat food or drink liquids after midnight.   Take these medicines the morning of surgery with A SIP OF WATER: Amlodipine(Norvas),citalopram(Celexa),levothyroxine(Synthroid)           Do not wear jewelry, make-up or nail polish.  Do not wear lotions, powders, or perfumes. You may wear deodorant.  Do not shave 48 hours prior to surgery.   Do not bring valuables to the hospital.  Contacts, dentures or bridgework may not be worn into surgery.  Leave suitcase in the car. After surgery it may be brought to your room.  For patients admitted to the hospital, checkout time is 11:00 AM the day of discharge.   Patients discharged the day of surgery will not be allowed to drive home.    Special Instructions: Shower using CHG 2 nights before surgery and the night before surgery.  If you shower the day of surgery use CHG.  Use special wash - you have one bottle of CHG for all showers.  You should use approximately 1/3 of the bottle for each shower.   Please read over the following fact sheets that you were given: Pain Booklet, Coughing and Deep Breathing, Blood Transfusion Information and Surgical Site Infection Prevention

## 2012-05-31 MED ORDER — CEFAZOLIN SODIUM-DEXTROSE 2-3 GM-% IV SOLR
2.0000 g | INTRAVENOUS | Status: AC
  Administered 2012-06-01: 2 g via INTRAVENOUS
  Filled 2012-05-31: qty 50

## 2012-05-31 NOTE — Progress Notes (Signed)
SPOKE WITH PATIENT AND INSTRUCTED HER TO ARRIVE AT 700 AM ON 06/01/12.

## 2012-05-31 NOTE — Progress Notes (Signed)
Message left for pt to arrive at hospital at 0700.  Instructed pt to call back when this message received and to abide by the current instructions for medicines and eating/drinking.

## 2012-05-31 NOTE — H&P (Signed)
  [facility] [address]   [address] [place], [place] [address] [phone] A Division of [facility]  Alexis Ortega, M.D.   Robert A. Thurston Hole, M.D.   Burnell Blanks, M.D.   Eulas Post, M.D.   Lunette Stands, M.D Buford Dresser, M.D.  Charlsie Quest, M.D.   Estell Harpin, M.D.   Melina Fiddler, M.D. Genene Churn. Barry Dienes, PA-C            Kirstin A. Shepperson, PA-C Josh Henry, PA-C [place], [place]   RE: Alexis, Ortega   [id number]      DOB: 1945-11-10 PROGRESS NOTE: 05-17-12 Chief complaint: Left knee pain. History of present illness: 67 year old white female with history of end stage degenerative joint disease left knee and chronic pain. Symptoms are unchanged and she's ready to schedule total knee replacement. She states a few days ago she finished an antibiotic for a lower respiratory infection. She continues to have a productive cough. She has a documented history of asthma and is a smoker. She's used inhalers in the past. Says she got an Albuterol inhaler from a family member. No fever or chills. Current medications: Citalopram, Synthroid, Losartan. No known drug allergies. Past medical/surgical history: hypertension, depression, cholecystectomy, partial hysterectomy tubal ligation, lumbar spine compression fracture, asthma, hypothyroid. Family history: positive diabetes hypertension heart disease arthritis. Social history: she's married and retired. Admits smoking denies alcohol use. Review of systems: she has productive cough. Denies fever chills light headedness dizziness shortness of breath cardiac GI GU issues.  EXAMINATION: Height 5'4" weight 162 pounds. Temp 97.7. Blood pressure 151/88. Pulse 64. Respirations 18. Alert and oriented x3 in no acute distress. Head is normal cephalic atraumatic. Lungs have scattered rhonchi and wheezes bilaterally. Heart regular rate and rhythm no murmurs. Abdomen round  non-distended. NBS x4. Soft non-tender. Left knee decreased range of motion positive crepitus joint line tenderness positive effusion ligaments stable. Calf non-tender neurovascularly intact. Skin warm and dry.   X-RAYS: Previous films left knee show end stage degenerative joint disease with periarticular spurs.   IMPRESSION: Left knee end stage degenerative joint disease. Question COPD with recent treatment for bronchitis. Still having wheezing.  DISPOSITION: Advised that she needs to be cleared by her primary care physician due to her pulmonary issues. Once she is cleared we will proceed with left total knee replacement. Discussed risks benefits and possible complications and rehab/recovery time. She may need short rehab placement post-op.  All questions answered.  Alexis Ortega, M.D.  Electronically verified by Alexis Ortega, M.D. DFM(JMO):kh D 05-19-12 T 05-20-12

## 2012-06-01 ENCOUNTER — Inpatient Hospital Stay (HOSPITAL_COMMUNITY): Payer: Medicare Other

## 2012-06-01 ENCOUNTER — Inpatient Hospital Stay (HOSPITAL_COMMUNITY): Payer: Medicare Other | Admitting: Anesthesiology

## 2012-06-01 ENCOUNTER — Encounter (HOSPITAL_COMMUNITY): Payer: Self-pay | Admitting: Anesthesiology

## 2012-06-01 ENCOUNTER — Encounter (HOSPITAL_COMMUNITY)
Admission: RE | Disposition: A | Discharge status: Home Health | Payer: Self-pay | Source: Ambulatory Visit | Attending: Orthopedic Surgery

## 2012-06-01 ENCOUNTER — Inpatient Hospital Stay (HOSPITAL_COMMUNITY)
Admission: RE | Admit: 2012-06-01 | Discharge: 2012-06-04 | DRG: 470 | Disposition: A | Discharge status: Home Health | Payer: Medicare Other | Source: Ambulatory Visit | Attending: Orthopedic Surgery | Admitting: Orthopedic Surgery

## 2012-06-01 DIAGNOSIS — Z8261 Family history of arthritis: Secondary | ICD-10-CM

## 2012-06-01 DIAGNOSIS — E039 Hypothyroidism, unspecified: Secondary | ICD-10-CM | POA: Diagnosis present

## 2012-06-01 DIAGNOSIS — Z01812 Encounter for preprocedural laboratory examination: Secondary | ICD-10-CM

## 2012-06-01 DIAGNOSIS — K219 Gastro-esophageal reflux disease without esophagitis: Secondary | ICD-10-CM | POA: Diagnosis present

## 2012-06-01 DIAGNOSIS — Z79899 Other long term (current) drug therapy: Secondary | ICD-10-CM

## 2012-06-01 DIAGNOSIS — Z8249 Family history of ischemic heart disease and other diseases of the circulatory system: Secondary | ICD-10-CM

## 2012-06-01 DIAGNOSIS — G8929 Other chronic pain: Secondary | ICD-10-CM | POA: Diagnosis present

## 2012-06-01 DIAGNOSIS — D62 Acute posthemorrhagic anemia: Secondary | ICD-10-CM | POA: Diagnosis not present

## 2012-06-01 DIAGNOSIS — F17200 Nicotine dependence, unspecified, uncomplicated: Secondary | ICD-10-CM | POA: Diagnosis present

## 2012-06-01 DIAGNOSIS — I10 Essential (primary) hypertension: Secondary | ICD-10-CM | POA: Diagnosis present

## 2012-06-01 DIAGNOSIS — F3289 Other specified depressive episodes: Secondary | ICD-10-CM | POA: Diagnosis present

## 2012-06-01 DIAGNOSIS — M1710 Unilateral primary osteoarthritis, unspecified knee: Principal | ICD-10-CM | POA: Diagnosis present

## 2012-06-01 DIAGNOSIS — F329 Major depressive disorder, single episode, unspecified: Secondary | ICD-10-CM | POA: Diagnosis present

## 2012-06-01 DIAGNOSIS — J45909 Unspecified asthma, uncomplicated: Secondary | ICD-10-CM | POA: Diagnosis present

## 2012-06-01 DIAGNOSIS — IMO0001 Reserved for inherently not codable concepts without codable children: Secondary | ICD-10-CM | POA: Diagnosis present

## 2012-06-01 DIAGNOSIS — Z96652 Presence of left artificial knee joint: Secondary | ICD-10-CM

## 2012-06-01 DIAGNOSIS — Z833 Family history of diabetes mellitus: Secondary | ICD-10-CM

## 2012-06-01 DIAGNOSIS — Z7901 Long term (current) use of anticoagulants: Secondary | ICD-10-CM

## 2012-06-01 HISTORY — PX: TOTAL KNEE ARTHROPLASTY: SHX125

## 2012-06-01 LAB — CREATININE, SERUM
GFR calc Af Amer: >90 mL/min (ref >90)
GFR calc non Af Amer: 88 mL/min — ABNORMAL LOW (ref >90)

## 2012-06-01 LAB — CBC
HCT: 32 % — ABNORMAL LOW (ref 36.0–46.0)
MCV: 92 fL (ref 78.0–100.0)
Platelets: 256 10*3/uL (ref 150–400)
RBC: 3.48 MIL/uL — ABNORMAL LOW (ref 3.87–5.11)
WBC: 19.1 10*3/uL — ABNORMAL HIGH (ref 4.0–10.5)

## 2012-06-01 SURGERY — ARTHROPLASTY, KNEE, TOTAL
Anesthesia: General | Site: Knee | Laterality: Left | Wound class: Clean

## 2012-06-01 MED ORDER — AMLODIPINE BESYLATE 5 MG PO TABS
5.0000 mg | ORAL_TABLET | Freq: Every day | ORAL | Status: DC
  Filled 2012-06-01 (×4): qty 1

## 2012-06-01 MED ORDER — HYDROCODONE-ACETAMINOPHEN 7.5-325 MG PO TABS
1.0000 | ORAL_TABLET | ORAL | Status: DC | PRN
  Administered 2012-06-01 – 2012-06-02 (×2): 2 via ORAL
  Filled 2012-06-01 (×2): qty 1

## 2012-06-01 MED ORDER — ACETAMINOPHEN 650 MG RE SUPP: 650.0000 mg | Freq: Four times a day (QID) | RECTAL | Status: DC | PRN

## 2012-06-01 MED ORDER — LIDOCAINE HCL (CARDIAC) 20 MG/ML IV SOLN
INTRAVENOUS | Status: DC | PRN
  Administered 2012-06-01: 80 mg via INTRAVENOUS

## 2012-06-01 MED ORDER — POTASSIUM CHLORIDE IN NACL 20-0.9 MEQ/L-% IV SOLN
INTRAVENOUS | Status: DC
  Administered 2012-06-02 – 2012-06-03 (×2): via INTRAVENOUS
  Filled 2012-06-01 (×8): qty 1000

## 2012-06-01 MED ORDER — COUMADIN BOOK
Freq: Once | Status: AC
  Administered 2012-06-01: 18:00:00
  Filled 2012-06-01: qty 1

## 2012-06-01 MED ORDER — ONDANSETRON HCL 4 MG/2ML IJ SOLN: 4.0000 mg | Freq: Four times a day (QID) | INTRAMUSCULAR | Status: DC | PRN

## 2012-06-01 MED ORDER — GLYCOPYRROLATE 0.2 MG/ML IJ SOLN
INTRAMUSCULAR | Status: DC | PRN
  Administered 2012-06-01: 0.2 mg via INTRAVENOUS
  Administered 2012-06-01: 0.4 mg via INTRAVENOUS

## 2012-06-01 MED ORDER — WARFARIN VIDEO: Freq: Once | Status: DC

## 2012-06-01 MED ORDER — FENTANYL CITRATE 0.05 MG/ML IJ SOLN
INTRAMUSCULAR | Status: AC
  Filled 2012-06-01: qty 2

## 2012-06-01 MED ORDER — OXYCODONE HCL 5 MG PO TABS: 5.0000 mg | ORAL_TABLET | Freq: Once | ORAL | Status: DC | PRN

## 2012-06-01 MED ORDER — DEXAMETHASONE SODIUM PHOSPHATE 4 MG/ML IJ SOLN
INTRAMUSCULAR | Status: DC | PRN
  Administered 2012-06-01: 4 mg via INTRAVENOUS

## 2012-06-01 MED ORDER — LACTATED RINGERS IV SOLN
INTRAVENOUS | Status: DC
  Administered 2012-06-01: 09:00:00 via INTRAVENOUS

## 2012-06-01 MED ORDER — ACETAMINOPHEN 325 MG PO TABS: 650.0000 mg | ORAL_TABLET | Freq: Four times a day (QID) | ORAL | Status: DC | PRN

## 2012-06-01 MED ORDER — HYDROMORPHONE HCL PF 1 MG/ML IJ SOLN: 0.2500 mg | INTRAMUSCULAR | Status: DC | PRN

## 2012-06-01 MED ORDER — METOCLOPRAMIDE HCL 5 MG/ML IJ SOLN: 5.0000 mg | Freq: Three times a day (TID) | INTRAMUSCULAR | Status: DC | PRN

## 2012-06-01 MED ORDER — HYDROMORPHONE HCL PF 1 MG/ML IJ SOLN
1.0000 mg | INTRAMUSCULAR | Status: DC | PRN
  Administered 2012-06-01 – 2012-06-02 (×4): 1 mg via INTRAVENOUS
  Filled 2012-06-01 (×7): qty 1

## 2012-06-01 MED ORDER — PHENOL 1.4 % MT LIQD: 1.0000 | OROMUCOSAL | Status: DC | PRN

## 2012-06-01 MED ORDER — BUPIVACAINE HCL (PF) 0.25 % IJ SOLN
INTRAMUSCULAR | Status: AC
  Filled 2012-06-01: qty 30

## 2012-06-01 MED ORDER — ENOXAPARIN SODIUM 30 MG/0.3ML ~~LOC~~ SOLN
30.0000 mg | Freq: Two times a day (BID) | SUBCUTANEOUS | Status: DC
  Administered 2012-06-02 – 2012-06-04 (×5): 30 mg via SUBCUTANEOUS
  Filled 2012-06-01 (×7): qty 0.3

## 2012-06-01 MED ORDER — HYDROMORPHONE HCL PF 1 MG/ML IJ SOLN
INTRAMUSCULAR | Status: DC | PRN
  Administered 2012-06-01: 0.5 mg via INTRAVENOUS

## 2012-06-01 MED ORDER — MENTHOL 3 MG MT LOZG: 1.0000 | LOZENGE | OROMUCOSAL | Status: DC | PRN

## 2012-06-01 MED ORDER — OXYCODONE HCL 5 MG/5ML PO SOLN: 5.0000 mg | Freq: Once | ORAL | Status: DC | PRN

## 2012-06-01 MED ORDER — NEOSTIGMINE METHYLSULFATE 1 MG/ML IJ SOLN
INTRAMUSCULAR | Status: DC | PRN
  Administered 2012-06-01: 3 mg via INTRAVENOUS

## 2012-06-01 MED ORDER — SENNOSIDES-DOCUSATE SODIUM 8.6-50 MG PO TABS: 1.0000 | ORAL_TABLET | Freq: Every evening | ORAL | Status: DC | PRN

## 2012-06-01 MED ORDER — CEFAZOLIN SODIUM 1-5 GM-% IV SOLN
1.0000 g | Freq: Three times a day (TID) | INTRAVENOUS | Status: AC
  Administered 2012-06-01: 1 g via INTRAVENOUS
  Filled 2012-06-01 (×2): qty 50

## 2012-06-01 MED ORDER — BUPIVACAINE LIPOSOME 1.3 % IJ SUSP
20.0000 mL | INTRAMUSCULAR | Status: AC
  Administered 2012-06-01: 20 mL
  Filled 2012-06-01: qty 20

## 2012-06-01 MED ORDER — METHOCARBAMOL 100 MG/ML IJ SOLN
500.0000 mg | Freq: Four times a day (QID) | INTRAVENOUS | Status: DC | PRN
  Filled 2012-06-01: qty 5

## 2012-06-01 MED ORDER — METOCLOPRAMIDE HCL 5 MG PO TABS
5.0000 mg | ORAL_TABLET | Freq: Three times a day (TID) | ORAL | Status: DC | PRN
  Filled 2012-06-01: qty 2

## 2012-06-01 MED ORDER — CITALOPRAM HYDROBROMIDE 20 MG PO TABS
20.0000 mg | ORAL_TABLET | Freq: Every day | ORAL | Status: DC
  Administered 2012-06-02 – 2012-06-04 (×3): 20 mg via ORAL
  Filled 2012-06-01 (×4): qty 1

## 2012-06-01 MED ORDER — WARFARIN - PHARMACIST DOSING INPATIENT: Freq: Every day | Status: DC

## 2012-06-01 MED ORDER — METHOCARBAMOL 500 MG PO TABS
500.0000 mg | ORAL_TABLET | Freq: Four times a day (QID) | ORAL | Status: DC | PRN
  Administered 2012-06-01 – 2012-06-02 (×4): 500 mg via ORAL
  Filled 2012-06-01 (×3): qty 1

## 2012-06-01 MED ORDER — SODIUM CHLORIDE 0.9 % IJ SOLN
INTRAMUSCULAR | Status: AC
  Filled 2012-06-01: qty 12

## 2012-06-01 MED ORDER — BUPIVACAINE HCL (PF) 0.25 % IJ SOLN
INTRAMUSCULAR | Status: DC | PRN
  Administered 2012-06-01: 10 mL

## 2012-06-01 MED ORDER — ROCURONIUM BROMIDE 100 MG/10ML IV SOLN
INTRAVENOUS | Status: DC | PRN
  Administered 2012-06-01: 50 mg via INTRAVENOUS

## 2012-06-01 MED ORDER — BISACODYL 10 MG RE SUPP
10.0000 mg | Freq: Every day | RECTAL | Status: DC | PRN
  Filled 2012-06-01: qty 1

## 2012-06-01 MED ORDER — FENTANYL CITRATE 0.05 MG/ML IJ SOLN
25.0000 ug | INTRAMUSCULAR | Status: AC | PRN
  Administered 2012-06-01 (×5): 25 ug via INTRAVENOUS

## 2012-06-01 MED ORDER — FENTANYL CITRATE 0.05 MG/ML IJ SOLN
INTRAMUSCULAR | Status: AC
  Administered 2012-06-01: 25 ug via INTRAVENOUS
  Filled 2012-06-01: qty 2

## 2012-06-01 MED ORDER — PROPOFOL 10 MG/ML IV BOLUS
INTRAVENOUS | Status: DC | PRN
  Administered 2012-06-01: 170 mg via INTRAVENOUS

## 2012-06-01 MED ORDER — ARTIFICIAL TEARS OP OINT
TOPICAL_OINTMENT | OPHTHALMIC | Status: DC | PRN
  Administered 2012-06-01: 1 via OPHTHALMIC

## 2012-06-01 MED ORDER — DOCUSATE SODIUM 100 MG PO CAPS
100.0000 mg | ORAL_CAPSULE | Freq: Two times a day (BID) | ORAL | Status: DC
  Administered 2012-06-01 – 2012-06-04 (×6): 100 mg via ORAL
  Filled 2012-06-01 (×9): qty 1

## 2012-06-01 MED ORDER — SODIUM CHLORIDE 0.9 % IJ SOLN
INTRAMUSCULAR | Status: DC | PRN
  Administered 2012-06-01: 40 mL

## 2012-06-01 MED ORDER — LEVOTHYROXINE SODIUM 75 MCG PO TABS
75.0000 ug | ORAL_TABLET | Freq: Every day | ORAL | Status: DC
  Administered 2012-06-02 – 2012-06-04 (×3): 75 ug via ORAL
  Filled 2012-06-01 (×4): qty 1

## 2012-06-01 MED ORDER — ONDANSETRON HCL 4 MG PO TABS: 4.0000 mg | ORAL_TABLET | Freq: Four times a day (QID) | ORAL | Status: DC | PRN

## 2012-06-01 MED ORDER — SODIUM CHLORIDE 0.9 % IR SOLN
Status: DC | PRN
  Administered 2012-06-01: 1000 mL

## 2012-06-01 MED ORDER — WARFARIN SODIUM 5 MG PO TABS
5.0000 mg | ORAL_TABLET | Freq: Once | ORAL | Status: AC
  Administered 2012-06-01: 5 mg via ORAL
  Filled 2012-06-01: qty 1

## 2012-06-01 MED ORDER — LACTATED RINGERS IV SOLN
INTRAVENOUS | Status: DC | PRN
  Administered 2012-06-01 (×2): via INTRAVENOUS

## 2012-06-01 MED ORDER — MIDAZOLAM HCL 5 MG/5ML IJ SOLN
INTRAMUSCULAR | Status: DC | PRN
  Administered 2012-06-01 (×2): 1 mg via INTRAVENOUS

## 2012-06-01 MED ORDER — ONDANSETRON HCL 4 MG/2ML IJ SOLN
INTRAMUSCULAR | Status: DC | PRN
  Administered 2012-06-01: 4 mg via INTRAVENOUS

## 2012-06-01 MED ORDER — HYDROCODONE-ACETAMINOPHEN 5-325 MG PO TABS
ORAL_TABLET | ORAL | Status: AC
  Filled 2012-06-01: qty 3

## 2012-06-01 MED ORDER — FENTANYL CITRATE 0.05 MG/ML IJ SOLN
INTRAMUSCULAR | Status: DC | PRN
  Administered 2012-06-01 (×5): 50 ug via INTRAVENOUS

## 2012-06-01 SURGICAL SUPPLY — 58 items
BANDAGE ESMARK 6X9 LF (GAUZE/BANDAGES/DRESSINGS) ×1 IMPLANT
BLADE SAG 18X100X1.27 (BLADE) ×4 IMPLANT
BNDG CMPR 9X6 STRL LF SNTH (GAUZE/BANDAGES/DRESSINGS) ×1
BNDG ESMARK 6X9 LF (GAUZE/BANDAGES/DRESSINGS) ×2
BOOTCOVER CLEANROOM LRG (PROTECTIVE WEAR) ×4 IMPLANT
BOWL SMART MIX CTS (DISPOSABLE) ×2 IMPLANT
CEMENT BONE SIMPLEX SPEEDSET (Cement) ×4 IMPLANT
CLOTH BEACON ORANGE TIMEOUT ST (SAFETY) ×2 IMPLANT
COVER SURGICAL LIGHT HANDLE (MISCELLANEOUS) ×2 IMPLANT
CUFF TOURNIQUET SINGLE 34IN LL (TOURNIQUET CUFF) ×2 IMPLANT
DRAPE EXTREMITY T 121X128X90 (DRAPE) ×2 IMPLANT
DRAPE PROXIMA HALF (DRAPES) ×2 IMPLANT
DRAPE U-SHAPE 47X51 STRL (DRAPES) ×2 IMPLANT
DRSG PAD ABDOMINAL 8X10 ST (GAUZE/BANDAGES/DRESSINGS) ×2 IMPLANT
DURAPREP 26ML APPLICATOR (WOUND CARE) ×3 IMPLANT
ELECT CAUTERY BLADE 6.4 (BLADE) ×2 IMPLANT
ELECT REM PT RETURN 9FT ADLT (ELECTROSURGICAL) ×2
ELECTRODE REM PT RTRN 9FT ADLT (ELECTROSURGICAL) ×1 IMPLANT
EVACUATOR 1/8 PVC DRAIN (DRAIN) ×2 IMPLANT
FACESHIELD LNG OPTICON STERILE (SAFETY) ×2 IMPLANT
GAUZE XEROFORM 5X9 LF (GAUZE/BANDAGES/DRESSINGS) ×2 IMPLANT
GLOVE BIOGEL PI IND STRL 8 (GLOVE) ×1 IMPLANT
GLOVE BIOGEL PI INDICATOR 8 (GLOVE) ×1
GLOVE ORTHO TXT STRL SZ7.5 (GLOVE) ×2 IMPLANT
GOWN PREVENTION PLUS XLARGE (GOWN DISPOSABLE) ×5 IMPLANT
GOWN STRL NON-REIN LRG LVL3 (GOWN DISPOSABLE) ×2 IMPLANT
GOWN STRL REIN 2XL XLG LVL4 (GOWN DISPOSABLE) ×2 IMPLANT
HANDPIECE INTERPULSE COAX TIP (DISPOSABLE) ×2
IMMOBILIZER KNEE 22 UNIV (SOFTGOODS) ×2 IMPLANT
IMMOBILIZER KNEE 24 THIGH 36 (MISCELLANEOUS) IMPLANT
IMMOBILIZER KNEE 24 UNIV (MISCELLANEOUS)
KIT BASIN OR (CUSTOM PROCEDURE TRAY) ×2 IMPLANT
KIT ROOM TURNOVER OR (KITS) ×2 IMPLANT
MANIFOLD NEPTUNE II (INSTRUMENTS) ×2 IMPLANT
NDL 18GX1X1/2 (RX/OR ONLY) (NEEDLE) ×1 IMPLANT
NDL HYPO 25GX1X1/2 BEV (NEEDLE) ×1 IMPLANT
NEEDLE 18GX1X1/2 (RX/OR ONLY) (NEEDLE) ×2 IMPLANT
NEEDLE HYPO 25GX1X1/2 BEV (NEEDLE) ×2 IMPLANT
NS IRRIG 1000ML POUR BTL (IV SOLUTION) ×2 IMPLANT
PACK TOTAL JOINT (CUSTOM PROCEDURE TRAY) ×2 IMPLANT
PAD ARMBOARD 7.5X6 YLW CONV (MISCELLANEOUS) ×4 IMPLANT
PAD CAST 4YDX4 CTTN HI CHSV (CAST SUPPLIES) ×1 IMPLANT
PADDING CAST COTTON 4X4 STRL (CAST SUPPLIES) ×2
PADDING CAST COTTON 6X4 STRL (CAST SUPPLIES) ×2 IMPLANT
RUBBERBAND STERILE (MISCELLANEOUS) ×2 IMPLANT
SET HNDPC FAN SPRY TIP SCT (DISPOSABLE) ×1 IMPLANT
SPONGE GAUZE 4X4 12PLY (GAUZE/BANDAGES/DRESSINGS) ×2 IMPLANT
STAPLER VISISTAT 35W (STAPLE) ×2 IMPLANT
SUCTION FRAZIER TIP 10 FR DISP (SUCTIONS) ×2 IMPLANT
SUT VIC AB 1 CTX 36 (SUTURE) ×4
SUT VIC AB 1 CTX36XBRD ANBCTR (SUTURE) ×2 IMPLANT
SUT VIC AB 2-0 CT1 27 (SUTURE) ×4
SUT VIC AB 2-0 CT1 TAPERPNT 27 (SUTURE) ×2 IMPLANT
SYR CONTROL 10ML LL (SYRINGE) ×2 IMPLANT
TOWEL OR 17X24 6PK STRL BLUE (TOWEL DISPOSABLE) ×2 IMPLANT
TOWEL OR 17X26 10 PK STRL BLUE (TOWEL DISPOSABLE) ×2 IMPLANT
TRAY FOLEY CATH 14FR (SET/KITS/TRAYS/PACK) ×2 IMPLANT
WATER STERILE IRR 1000ML POUR (IV SOLUTION) ×4 IMPLANT

## 2012-06-01 NOTE — Preoperative (Signed)
Beta Blockers   Reason not to administer Beta Blockers:Not Applicable 

## 2012-06-01 NOTE — Op Note (Signed)
NAMEMACKENSEY, BOLTE                ACCOUNT NO.:  [id number]  MEDICAL RECORD NO.:  [id number]  LOCATION:  5N23C                        FACILITY:  [facility]  PHYSICIAN:  Loreta Ave, M.D. DATE OF BIRTH:  09/21/1945  DATE OF PROCEDURE:  06/01/2012 DATE OF DISCHARGE:                              OPERATIVE REPORT   PREOPERATIVE DIAGNOSIS:  Left knee end-stage degenerative arthritis, mild varus alignment with marked changes patellofemoral joint.  POSTOPERATIVE DIAGNOSIS:  Left knee end-stage degenerative arthritis, mild varus alignment with marked changes patellofemoral joint.  PROCEDURE:  Modified minimally invasive left total knee replacement, Stryker triathlon prosthesis.  Soft tissue balancing.  Cemented pegged posterior stabilized #3 femoral component.  Cemented #3 tibial component 9-mm polyethylene insert.  Cemented resurfacing 35-mm pegged medial offset patellar component.  SURGEON:  Loreta Ave, MD  ASSISTANT:  Genene Churn. Barry Dienes, [place], present throughout the entire case and necessary for timely completion of procedure.  ANESTHESIA:  General.  BLOOD LOSS:  Minimal.  SPECIMENS:  None.  CULTURES:  None.  COMPLICATION:  None.  DRESSINGS:  Soft compressive knee immobilizer.  DRAINS:  Hemovac x1.  TOURNIQUET TIME:  45 minutes.  PROCEDURE:  The patient was brought to the operating room, placed on the operating table in supine position.  After adequate anesthesia had been obtained, tourniquet applied.  Prepped and draped in usual sterile fashion.  Exsanguinated with elevation of Esmarch.  Tourniquet inflated to 350 mmHg.  Knee examined.  Fairly good extension and flexion.  Varus alignment correctable.  Incision above the patella down to the tibial tubercle.  Hemostasis with cautery.  Medial arthrotomy, vastus splitting, preserving quad tendon.  Knee exposed.  Grade 4 changes throughout the patellofemoral joint.  Some focal grade 4 changes medially.  Medial capsule  released.  Remnants of menisci, cruciate ligaments removed.  Distal femur exposed.  Intramedullary guide placed. A 10-mm resection distal femur 5 degrees of valgus.  Using epicondylar axis, the femur was sized, cut, and fitted for posterior stabilized pegged #3 component.  Proximal tibial resection extramedullary guide a 3- degree posterior slope cut.  Sized to #3 component.  Debris cleared throughout the knee.  Nicely balanced in flexion and extension.  Patella exposed.  Posterior 10 mm removed.  Drilled, sized, and fitted for a 35 mm component.  Trials put in place, #3 on the femur, #3 on the tibia, 9 mm insert, and 35 patella.  With this construct, with a little bit of a medial capsule release, I was very pleased with the balancing by mechanical axis.  Nicely stable in flexion and extension.  Good patellar tracking.  Tibia was marked for rotation and hand reamed.  All trials removed.  Copious irrigation with a pulse irrigating device.  Cement prepared, placed on all components, firmly seated.  Polyethylene attached to tibia, knee reduced.  Patella held with a clamp.  After the cement was hardened, the knee was irrigated again.  Soft tissues injected with Exparel.  Arthrotomy closed with #1 Vicryl.  Skin and subcutaneous tissue with Vicryl and staples.  Sterile compressive dressing applied.  Prior to closure Hemovac had been placed and brought out through a separate stab wound.  Tourniquet deflated  and removed. Knee immobilizer applied.  Anesthesia reversed.  Brought to the recovery room.  Tolerated surgery well.  No complications.     Loreta Ave, M.D.     DFM/MEDQ  D:  06/01/2012  T:  06/01/2012  Job:  [id number]

## 2012-06-01 NOTE — Brief Op Note (Signed)
06/01/2012  12:14 PM  PATIENT:  [name]  67 y.o. female  PRE-OPERATIVE DIAGNOSIS:  DJD LEFT KNEE  POST-OPERATIVE DIAGNOSIS:  DJD LEFT KNEE  PROCEDURE:  Procedure(s): LEFT TOTAL KNEE ARTHROPLASTY (Left)  SURGEON:  Surgeon(s) and Role:    * Loreta Ave, MD - Primary  PHYSICIAN ASSISTANT: Zonia Kief M     ANESTHESIA:   general  EBL:  Total I/O In: 1300 [I.V.:1300] Out: 175 [Urine:150; Blood:25]  BLOOD ADMINISTERED:none  DRAINS: hemovac  LOCAL MEDICATIONS USED:   Exparel/injectable ns 20:40  SPECIMEN:  No Specimen  DISPOSITION OF SPECIMEN:  N/A  COUNTS:  YES  TOURNIQUET:   Total Tourniquet Time Documented: Thigh (Left) - 76 minutes Total: Thigh (Left) - 76 minutes     PATIENT DISPOSITION:  PACU - hemodynamically stable.

## 2012-06-01 NOTE — Interval H&P Note (Signed)
History and Physical Interval Note:  06/01/2012 8:10 AM  Alexis Ortega  has presented today for surgery, with the diagnosis of DJD LEFT KNEE  The various methods of treatment have been discussed with the patient and family. After consideration of risks, benefits and other options for treatment, the patient has consented to  Procedure(s): TOTAL KNEE ARTHROPLASTY (Left) as a surgical intervention .  The patient's history has been reviewed, patient examined, no change in status, stable for surgery.  I have reviewed the patient's chart and labs.  Questions were answered to the patient's satisfaction.     [name] F

## 2012-06-01 NOTE — Progress Notes (Signed)
Utilization review completed. Binnie Droessler, RN, BSN. 

## 2012-06-01 NOTE — Anesthesia Preprocedure Evaluation (Signed)
Anesthesia Evaluation  Patient identified by MRN, date of birth, ID band Patient awake    Reviewed: Allergy & Precautions, H&P , NPO status , Patient's Chart, lab work & pertinent test results  Airway Mallampati: II  Neck ROM: full    Dental   Pulmonary Current Smoker,          Cardiovascular hypertension,     Neuro/Psych  Neuromuscular disease    GI/Hepatic GERD-  ,  Endo/Other  Hypothyroidism   Renal/GU      Musculoskeletal  (+) Arthritis -, Fibromyalgia -  Abdominal   Peds  Hematology   Anesthesia Other Findings   Reproductive/Obstetrics                           Anesthesia Physical Anesthesia Plan  ASA: II  Anesthesia Plan: General   Post-op Pain Management:    Induction: Intravenous  Airway Management Planned: LMA  Additional Equipment:   Intra-op Plan:   Post-operative Plan:   Informed Consent: I have reviewed the patients History and Physical, chart, labs and discussed the procedure including the risks, benefits and alternatives for the proposed anesthesia with the patient or authorized representative who has indicated his/her understanding and acceptance.     Plan Discussed with: CRNA and Surgeon  Anesthesia Plan Comments:         Anesthesia Quick Evaluation

## 2012-06-01 NOTE — Progress Notes (Signed)
Orthopedic Tech Progress Note Patient Details:  Alexis Ortega [birth year] [id number]  CPM Left Knee CPM Left Knee: On Left Knee Flexion (Degrees): 60 Left Knee Extension (Degrees): 0 Additional Comments: TRAPEZE BAR   Shawnie Pons 06/01/2012, 2:07 PM

## 2012-06-01 NOTE — Progress Notes (Signed)
ANTICOAGULATION CONSULT NOTE - Initial Consult  Pharmacy Consult for coumadin Indication: VTE prophylaxis  Allergies  Allergen Reactions  . Tramadol Itching    Patient Measurements:  Wt: 74.3 kg  Vital Signs: Temp: 97.2 F (36.2 C) (03/19 1200) Temp src: Oral (03/19 0748) BP: 128/67 mmHg (03/19 1415) Pulse Rate: 65 (03/19 1415)  Labs: No results found for this basename: HGB, HCT, PLT, APTT, LABPROT, INR, HEPARINUNFRC, CREATININE, CKTOTAL, CKMB, TROPONINI,  in the last 72 hours  The CrCl is unknown because both a height and weight (above a minimum accepted value) are required for this calculation.   Medical History: Past Medical History  Diagnosis Date  . Hypertension   . Thyroid disease   . Hypothyroidism   . Depression   . GERD (gastroesophageal reflux disease)   . Arthritis   . Fibromyalgia     Medications:  Prescriptions prior to admission  Medication Sig Dispense Refill  . ALPRAZolam (XANAX) 0.5 MG tablet Take 0.5 mg by mouth at bedtime as needed for sleep.      Marland Kitchen amLODipine (NORVASC) 10 MG tablet Take 5 mg by mouth daily.       . Ascorbic Acid (VITAMIN C PO) Take 1 tablet by mouth daily.      Marland Kitchen aspirin 81 MG tablet Take 81 mg by mouth daily.      Marland Kitchen CALCIUM PO Take 1 tablet by mouth daily.      . citalopram (CELEXA) 20 MG tablet Take 20 mg by mouth daily.      Marland Kitchen levothyroxine (SYNTHROID, LEVOTHROID) 75 MCG tablet Take 75 mcg by mouth daily.      Marland Kitchen losartan (COZAAR) 100 MG tablet Take 100 mg by mouth daily.      . Multiple Vitamin (MULTIVITAMIN WITH MINERALS) TABS Take 1 tablet by mouth daily.        Assessment: 67 yo F s/p L TKA. To begin Coumadin for DVT prophylaxis.  INR noted to be 10.3 3/14.  Baseline CBC wnl, noted.  Goal of Therapy:  INR 2-3 Monitor platelets by anticoagulation protocol: Yes   Plan:  - Coumadin 5 mg po x1 tonight - Daily PT/INR - Initiate coumadin education with book/video  Jill Side L. Illene Bolus, PharmD, BCPS Clinical  Pharmacist Pager: [phone] Pharmacy: [phone] 06/01/2012 2:46 PM

## 2012-06-01 NOTE — Evaluation (Signed)
Physical Therapy Evaluation Patient Details Name: CARLISLE TORGESON MRN: [id number] DOB: 1946/02/27 Today's Date: 06/01/2012 Time: 2440-1027 PT Time Calculation (min): 34 min  PT Assessment / Plan / Recommendation Clinical Impression  Pt is a 67 y/o female s/p LTKA.  Pt did well with initial PT Eval.  Pt able to tolerate sitting on EOB x 2 minutes standing x 1 minute and ambulating 5 feet with no c/o dizziness.  Acute PT to follow pt to progress mobility for safe d/c to home.      PT Assessment  Patient needs continued PT services    Follow Up Recommendations  Home health PT    Does the patient have the potential to tolerate intense rehabilitation      Barriers to Discharge None NONE    Equipment Recommendations  None recommended by PT    Recommendations for Other Services     Frequency 7X/week    Precautions / Restrictions Precautions Precautions: Fall Restrictions Weight Bearing Restrictions: Yes LLE Weight Bearing: Weight bearing as tolerated   Pertinent Vitals/Pain 8/10 pain in knee.  Pt medicated prior to session.  RN notified      Mobility  Bed Mobility Bed Mobility: Supine to Sit;Sitting - Scoot to Edge of Bed Supine to Sit: 4: Min assist;HOB elevated Sitting - Scoot to [place] of Bed: 4: Min assist Details for Bed Mobility Assistance: assist for LLE Transfers Transfers: Sit to Stand;Stand to [employer] Transfers Sit to Stand: 4: Min assist;With upper extremity assist;From bed Stand to Sit: 4: Min assist;To chair/3-in-1;With upper extremity assist Stand Pivot Transfers: 4: Min assist Details for Transfer Assistance: Verbal cues for hand placement assist to steady pt.  Ambulation/Gait Ambulation/Gait Assistance: 5: Supervision Ambulation Distance (Feet): 5 Feet Assistive device: Rolling walker Ambulation/Gait Assistance Details: VCs for gait sequencing, use of RW and WBAT on LLE.   Gait Pattern: Step-to pattern Stairs: No    Exercises Total Joint  Exercises Ankle Circles/Pumps: 10 reps;AROM   PT Diagnosis: Difficulty walking;Generalized weakness;Acute pain  PT Problem List: Decreased strength;Decreased range of motion;Decreased activity tolerance;Decreased mobility;Pain;Decreased knowledge of precautions;Decreased knowledge of use of DME PT Treatment Interventions: Gait training;Stair training;Therapeutic activities;Therapeutic exercise;Patient/family education;Functional mobility training;DME instruction   PT Goals Acute Rehab PT Goals PT Goal Formulation: With patient Time For Goal Achievement: 06/08/12 Potential to Achieve Goals: Good Pt will go Supine/Side to Sit: with modified independence PT Goal: Supine/Side to Sit - Progress: Goal set today Pt will go Sit to Supine/Side: with modified independence PT Goal: Sit to Supine/Side - Progress: Goal set today Pt will Transfer Bed to Chair/Chair to Bed: with modified independence PT Transfer Goal: Bed to Chair/Chair to Bed - Progress: Goal set today Pt will Ambulate: >150 feet;with supervision;with rolling walker PT Goal: Ambulate - Progress: Goal set today Pt will Perform Home Exercise Program: with supervision, verbal cues required/provided PT Goal: Perform Home Exercise Program - Progress: Goal set today  Visit Information  Last PT Received On: 06/01/12 Assistance Needed: +1    Subjective Data  Subjective: agree to PT eval Patient Stated Goal: Walk without pain.    Prior Functioning  Home Living Lives With: Family Available Help at Discharge: Family Type of Home: House Home Access: Level entry Home Layout: One level Bathroom Shower/Tub: Walk-in shower;Door Bathroom Toilet: Standard Bathroom Accessibility: Yes Home Adaptive Equipment: Hand-held shower hose;Grab bars in shower;Shower chair with back;Walker - rolling;Walker - four wheeled Prior Function Level of Independence: Independent Able to Take Stairs?: Yes Driving: Yes Vocation:  Retired Special educational needs teacher  Communication: No difficulties Dominant Hand: Right    Cognition  Cognition Overall Cognitive Status: Appears within functional limits for tasks assessed/performed Arousal/Alertness: Awake/alert Orientation Level: Appears intact for tasks assessed Behavior During Session: [facility] for tasks performed    Extremity/Trunk Assessment Right Upper Extremity Assessment RUE ROM/Strength/Tone: Within functional levels Left Upper Extremity Assessment LUE ROM/Strength/Tone: Within functional levels Right Lower Extremity Assessment RLE ROM/Strength/Tone: Deficits RLE ROM/Strength/Tone Deficits: 3-/5 strength in hip flexors and knee extensors.  Left Lower Extremity Assessment LLE ROM/Strength/Tone: Unable to fully assess;Due to pain   Balance Balance Balance Assessed: Yes Static Sitting Balance Static Sitting - Balance Support: Feet supported Static Sitting - Level of Assistance: 5: Stand by assistance;6: Modified independent (Device/Increase time) Static Sitting - Comment/# of Minutes: 2 minutes on EOB with no LOB.  No c/o dizziness or nausea.   Static Standing Balance Static Standing - Balance Support: Bilateral upper extremity supported Static Standing - Level of Assistance: 5: Stand by assistance Static Standing - Comment/# of Minutes: Pt stood 1 minute at EOB with no dizziness or c/o nausea.    End of Session PT - End of Session Equipment Utilized During Treatment: Gait belt;Right knee immobilizer Activity Tolerance: Patient tolerated treatment well Patient left: in chair;with call bell/phone within reach;with family/visitor present Nurse Communication: Mobility status;Weight bearing status CPM Left Knee CPM Left Knee: Off Additional Comments: CPM off at 1800  GP     Marriah Sanderlin 06/01/2012, 7:01 PM Johnae Friley L. Hobart Marte DPT [phone]

## 2012-06-01 NOTE — Transfer of Care (Signed)
Immediate Anesthesia Transfer of Care Note  Patient: Alexis Ortega  Procedure(s) Performed: Procedure(s): LEFT TOTAL KNEE ARTHROPLASTY (Left)  Patient Location: PACU  Anesthesia Type:General  Level of Consciousness: awake, alert  and oriented  Airway & Oxygen Therapy: Patient Spontanous Breathing and Patient connected to face mask oxygen  Post-op Assessment: Report given to PACU RN  Post vital signs: Reviewed and stable  Complications: No apparent anesthesia complications

## 2012-06-01 NOTE — Anesthesia Postprocedure Evaluation (Signed)
Anesthesia Post Note  Patient: Alexis Ortega  Procedure(s) Performed: Procedure(s) (LRB): LEFT TOTAL KNEE ARTHROPLASTY (Left)  Anesthesia type: General  Patient location: PACU  Post pain: Pain level controlled and Adequate analgesia  Post assessment: Post-op Vital signs reviewed, Patient's Cardiovascular Status Stable, Respiratory Function Stable, Patent Airway and Pain level controlled  Last Vitals:  Filed Vitals:   06/01/12 1300  BP: 129/55  Pulse: 70  Temp:   Resp: 15    Post vital signs: Reviewed and stable  Level of consciousness: awake, alert  and oriented  Complications: No apparent anesthesia complications

## 2012-06-02 ENCOUNTER — Encounter (HOSPITAL_COMMUNITY): Payer: Self-pay | Admitting: Orthopedic Surgery

## 2012-06-02 LAB — PROTIME-INR
INR: 1.16 (ref 0.00–1.49)
Prothrombin Time: 14.6 seconds (ref 11.6–15.2)

## 2012-06-02 LAB — BASIC METABOLIC PANEL
CO2: 26 mEq/L (ref 19–32)
Calcium: 8.6 mg/dL (ref 8.4–10.5)
Creatinine, Ser: 0.83 mg/dL (ref 0.50–1.10)
GFR calc Af Amer: 83 mL/min — ABNORMAL LOW (ref >90)
GFR calc non Af Amer: 72 mL/min — ABNORMAL LOW (ref >90)
Sodium: 128 mEq/L — ABNORMAL LOW (ref 135–145)

## 2012-06-02 LAB — CBC
HCT: 23.5 % — ABNORMAL LOW (ref 36.0–46.0)
Hemoglobin: 8.2 g/dL — ABNORMAL LOW (ref 12.0–15.0)
MCH: 31.8 pg (ref 26.0–34.0)
MCHC: 34.9 g/dL (ref 30.0–36.0)
MCV: 91.1 fL (ref 78.0–100.0)
Platelets: 210 10*3/uL (ref 150–400)
RBC: 2.58 MIL/uL — ABNORMAL LOW (ref 3.87–5.11)
RDW: 12.6 % (ref 11.5–15.5)
WBC: 12.8 10*3/uL — ABNORMAL HIGH (ref 4.0–10.5)

## 2012-06-02 LAB — HEMOGLOBIN AND HEMATOCRIT, BLOOD: HCT: 23.7 % — ABNORMAL LOW (ref 36.0–46.0)

## 2012-06-02 LAB — PREPARE RBC (CROSSMATCH)

## 2012-06-02 MED ORDER — WARFARIN SODIUM 5 MG PO TABS
5.0000 mg | ORAL_TABLET | Freq: Once | ORAL | Status: AC
  Administered 2012-06-02: 5 mg via ORAL
  Filled 2012-06-02: qty 1

## 2012-06-02 MED ORDER — ALPRAZOLAM 0.5 MG PO TABS
0.5000 mg | ORAL_TABLET | Freq: Every day | ORAL | Status: DC
  Administered 2012-06-02 – 2012-06-03 (×3): 0.5 mg via ORAL
  Filled 2012-06-02 (×3): qty 1

## 2012-06-02 MED ORDER — FUROSEMIDE 10 MG/ML IJ SOLN
20.0000 mg | Freq: Once | INTRAMUSCULAR | Status: AC
  Administered 2012-06-02: 20 mg via INTRAVENOUS
  Filled 2012-06-02: qty 2

## 2012-06-02 MED ORDER — METHOCARBAMOL 100 MG/ML IJ SOLN
500.0000 mg | Freq: Four times a day (QID) | INTRAVENOUS | Status: DC | PRN
  Filled 2012-06-02: qty 5

## 2012-06-02 MED ORDER — METHOCARBAMOL 750 MG PO TABS: 750.0000 mg | ORAL_TABLET | Freq: Four times a day (QID) | ORAL | Status: DC | PRN

## 2012-06-02 MED ORDER — OXYCODONE-ACETAMINOPHEN 5-325 MG PO TABS
1.0000 | ORAL_TABLET | ORAL | Status: DC | PRN
  Administered 2012-06-02 – 2012-06-04 (×13): 2 via ORAL
  Filled 2012-06-02 (×14): qty 2

## 2012-06-02 MED ORDER — METHOCARBAMOL 750 MG PO TABS
750.0000 mg | ORAL_TABLET | Freq: Four times a day (QID) | ORAL | Status: DC | PRN
  Administered 2012-06-02 – 2012-06-04 (×6): 750 mg via ORAL
  Filled 2012-06-02 (×6): qty 1

## 2012-06-02 NOTE — Progress Notes (Signed)
1530 Pt unable to tolerate bl going at 100cchr states it burns site rate decreased to 90 cc hr tol  well

## 2012-06-02 NOTE — Progress Notes (Signed)
Patient ID: Alexis Ortega, female   DOB: 1945/07/23, 67 y.o.   MRN: [id number]   Spoke with Nettie Elm RN by phone conversation this afternoon.  Mrs Lewellyn currently up with PT and feeling weak and tired.  preop Hgb 13.6, this morning 8.2.  Rechecked at noon and is 8.3.  RN advised patient that we will transfuse for symptomatic ABLA.   2 units prbc's ordered.

## 2012-06-02 NOTE — Progress Notes (Signed)
ANTICOAGULATION CONSULT NOTE - Follow Up Consult  Pharmacy Consult:  Coumadin Indication:  VTE prophylaxis  Allergies  Allergen Reactions  . Tramadol Itching    Patient Measurements: Height: 5' 4.17" (163 cm) Weight: 163 lb 12.8 oz (74.3 kg) IBW/kg (Calculated) : 55.1  Vital Signs: Temp: 97.8 F (36.6 C) (03/20 0604) Temp src: Oral (03/20 0604) BP: 122/60 mmHg (03/20 0612) Pulse Rate: 69 (03/20 0604)  Labs:  Recent Labs  06/01/12 1526 06/02/12 0610 06/02/12 1214  HGB 11.0* 8.2* 8.3*  HCT 32.0* 23.5* 23.7*  PLT 256 210  --   LABPROT  --  14.6  --   INR  --  1.16  --   CREATININE 0.71 0.83  --     Estimated Creatinine Clearance: 66.1 ml/min (by C-G formula based on Cr of 0.83).       Assessment: 32 YOF s/p left TKA to continue Lovenox and Coumadin for VTE prophylaxis.  INR sub-therapeutic as expected; no bleeding reported.   Goal of Therapy:  INR 2-3 Monitor platelets by anticoagulation protocol: Yes    Plan:  - Coumadin 5mg  PO today - Continue Lovenox until INR therapeutic - Daily PT / INR - F/U resume home when possible: losartan, ASA     Ronne Savoia D. Laney Potash, PharmD, BCPS Pager:  [phone] 06/02/2012, 1:41 PM

## 2012-06-02 NOTE — Progress Notes (Signed)
Physical Therapy Treatment Patient Details Name: Alexis Ortega MRN: [id number] DOB: 09-Dec-1945 Today's Date: 06/02/2012 Time: 4098-1191 PT Time Calculation (min): 54 min  PT Assessment / Plan / Recommendation Comments on Treatment Session  Patient s/p L TKA with increased pain and did not sleep well last night. However, patient did very well with therapy this morning and is very motivated to progress. Will attempt hall ambulation and increased therex this afternoon.     Follow Up Recommendations  Home health PT     Does the patient have the potential to tolerate intense rehabilitation     Barriers to Discharge        Equipment Recommendations  None recommended by PT    Recommendations for Other Services    Frequency 7X/week   Plan Discharge plan remains appropriate;Frequency remains appropriate    Precautions / Restrictions Precautions Precautions: Fall Required Braces or Orthoses: Knee Immobilizer - Left Knee Immobilizer - Left: On except when in CPM Restrictions Weight Bearing Restrictions: Yes LLE Weight Bearing: Weight bearing as tolerated   Pertinent Vitals/Pain 4/10 L knee pain. Premedicated    Mobility  Bed Mobility Supine to Sit: 4: Min assist;HOB elevated Sitting - Scoot to Edge of Bed: 4: Min guard Details for Bed Mobility Assistance: assist for LLE, cues for positioning Transfers Sit to Stand: 4: Min assist;With upper extremity assist;From bed;From chair/3-in-1 Stand to Sit: 4: Min assist;To chair/3-in-1;With upper extremity assist Details for Transfer Assistance: Verbal cues for hand placement assist to steady pt. Patient stood x3 this session Ambulation/Gait Ambulation/Gait Assistance: 4: Min guard Ambulation Distance (Feet): 30 Feet Assistive device: Rolling walker Ambulation/Gait Assistance Details: VCs for sequency and RW management. Good stedy balance throughout Gait Pattern: Step-to pattern;Antalgic Gait velocity: decreased    Exercises Total  Joint Exercises Ankle Circles/Pumps: AROM;Both;10 reps Quad Sets: AAROM;Left;10 reps Heel Slides: AAROM;Left;10 reps Hip ABduction/ADduction: AAROM;Left;10 reps   PT Diagnosis:    PT Problem List:   PT Treatment Interventions:     PT Goals Acute Rehab PT Goals PT Goal: Supine/Side to Sit - Progress: Progressing toward goal PT Transfer Goal: Bed to Chair/Chair to Bed - Progress: Progressing toward goal PT Goal: Ambulate - Progress: Progressing toward goal PT Goal: Perform Home Exercise Program - Progress: Progressing toward goal  Visit Information  Last PT Received On: 06/02/12 Assistance Needed: +1    Subjective Data      Cognition  Cognition Overall Cognitive Status: Appears within functional limits for tasks assessed/performed Arousal/Alertness: Awake/alert Orientation Level: Appears intact for tasks assessed Behavior During Session: [facility] for tasks performed    Balance     End of Session PT - End of Session Equipment Utilized During Treatment: Gait belt;Right knee immobilizer Activity Tolerance: Patient tolerated treatment well Patient left: in chair;with call bell/phone within reach;with family/visitor present Nurse Communication: Mobility status;Weight bearing status   GP     Fredrich Birks 06/02/2012, 11:46 AM  06/02/2012 Fredrich Birks PTA [phone] pager [phone] office

## 2012-06-02 NOTE — Progress Notes (Signed)
Physical Therapy Treatment Patient Details Name: Alexis Ortega MRN: [id number] DOB: [birth year] Today's Date: 06/02/2012 Time: 4098-1191 PT Time Calculation (min): 48 min  PT Assessment / Plan / Recommendation Comments on Treatment Session  Patient s/p L TKA with increased pain. Upon entering room patient was sitting on toilet without KI.  Educated that KI needs to be on if patient is up and walking. Husband present throughout session and education on mobility as well.     Follow Up Recommendations  Home health PT     Does the patient have the potential to tolerate intense rehabilitation     Barriers to Discharge        Equipment Recommendations  None recommended by PT    Recommendations for Other Services    Frequency 7X/week   Plan Discharge plan remains appropriate;Frequency remains appropriate    Precautions / Restrictions Precautions Precautions: Fall Required Braces or Orthoses: Knee Immobilizer - Left Knee Immobilizer - Left: On except when in CPM Restrictions LLE Weight Bearing: Weight bearing as tolerated   Pertinent Vitals/Pain Patient with knee pain after session. Placed patient personal ice pack on after returned to bed    Mobility  Bed Mobility Bed Mobility: Sit to Supine Supine to Sit: 4: Min assist;HOB elevated Sitting - Scoot to Edge of Bed: 4: Min guard Sit to Supine: 4: Min assist;HOB flat Details for Bed Mobility Assistance: assist for LLE, cues for positioning Transfers Sit to Stand: 4: Min assist;With upper extremity assist;From bed;From chair/3-in-1 Stand to Sit: 4: Min assist;To chair/3-in-1;With upper extremity assist;To bed Details for Transfer Assistance: Verbal cues for hand placement assist to steady patient with initial stand Ambulation/Gait Ambulation/Gait Assistance: 4: Min guard Ambulation Distance (Feet): 75 Feet Assistive device: Rolling walker Ambulation/Gait Assistance Details: Cues for posture and to relax shoulders Gait Pattern:  Step-to pattern;Antalgic Gait velocity: decreased    Exercises Total Joint Exercises Ankle Circles/Pumps: AROM;Both;10 reps Quad Sets: AAROM;Left;10 reps Heel Slides: AAROM;Left;10 reps Hip ABduction/ADduction: AAROM;Left;10 reps Straight Leg Raises: AAROM;Left;10 reps   PT Diagnosis:    PT Problem List:   PT Treatment Interventions:     PT Goals Acute Rehab PT Goals PT Goal: Supine/Side to Sit - Progress: Progressing toward goal PT Goal: Sit to Supine/Side - Progress: Progressing toward goal PT Transfer Goal: Bed to Chair/Chair to Bed - Progress: Progressing toward goal PT Goal: Ambulate - Progress: Progressing toward goal PT Goal: Perform Home Exercise Program - Progress: Progressing toward goal  Visit Information  Last PT Received On: 06/02/12 Assistance Needed: +1    Subjective Data      Cognition  Cognition Overall Cognitive Status: Appears within functional limits for tasks assessed/performed Arousal/Alertness: Awake/alert Orientation Level: Appears intact for tasks assessed Behavior During Session: [facility] for tasks performed    Balance     End of Session PT - End of Session Equipment Utilized During Treatment: Gait belt;Right knee immobilizer Activity Tolerance: Patient tolerated treatment well Patient left: with call bell/phone within reach;with family/visitor present;in bed Nurse Communication: Mobility status;Weight bearing status CPM Left Knee Additional Comments: Pt in bone foam when not in cpm   GP     Alexis Ortega, Alexis Ortega 06/02/2012, 2:27 PM 06/02/2012 Alexis Ortega PTA [phone] pager [phone] office

## 2012-06-02 NOTE — Progress Notes (Signed)
Occupational Therapy  Order received, reviewed chart/spoke with pt.  No acute OT needs identified.  Will sign off.   Randel Hargens, OTR/L [phone] 

## 2012-06-02 NOTE — Progress Notes (Signed)
Subjective: Patient c/o knee pain.  Somewhat better this AM.     Objective: Vital signs in last 24 hours: Temp:  [97.2 F (36.2 C)-98 F (36.7 C)] 97.8 F (36.6 C) (03/20 0604) Pulse Rate:  [60-100] 69 (03/20 0604) Resp:  [11-25] 16 (03/19 2050) BP: (103-157)/(34-87) 122/60 mmHg (03/20 0612) SpO2:  [95 %-100 %] 98 % (03/20 0604)  Intake/Output from previous day: 03/19 0701 - 03/20 0700 In: 1300 [I.V.:1300] Out: 775 [Urine:150; Drains:600; Blood:25] Intake/Output this shift: Total I/O In: -  Out: 1400 [Urine:1400]   Recent Labs  06/01/12 1526 06/02/12 0610  HGB 11.0* 8.2*    Recent Labs  06/01/12 1526 06/02/12 0610  WBC 19.1* 12.8*  RBC 3.48* 2.58*  HCT 32.0* 23.5*  PLT 256 210    Recent Labs  06/01/12 1526 06/02/12 0610  NA  --  128*  K  --  4.7  CL  --  96  CO2  --  26  BUN  --  11  CREATININE 0.71 0.83  GLUCOSE  --  132*  CALCIUM  --  8.6    Recent Labs  06/02/12 0610  INR 1.16    Exam:  Dressing c/d/i.  Calf nt, nvi.    Assessment/Plan: ABLA.  Will recheck H/H.  Will possibly need transfusion prbc's.    D/c dilaudid.  Switched norco to percocet last night.   Anticipate d/c home fri or sat if progresses well, but we did discuss possibility of short rehab placement.     Alexis Ortega M 06/02/2012, 10:26 AM

## 2012-06-03 ENCOUNTER — Encounter (HOSPITAL_COMMUNITY): Payer: Self-pay | Admitting: General Practice

## 2012-06-03 LAB — CBC
HCT: 27 % — ABNORMAL LOW (ref 36.0–46.0)
MCH: 31.9 pg (ref 26.0–34.0)
MCV: 88.8 fL (ref 78.0–100.0)
Platelets: 160 10*3/uL (ref 150–400)
RBC: 3.04 MIL/uL — ABNORMAL LOW (ref 3.87–5.11)
RDW: 13.2 % (ref 11.5–15.5)

## 2012-06-03 LAB — TYPE AND SCREEN
Antibody Screen: NEGATIVE
Unit division: 0

## 2012-06-03 LAB — BASIC METABOLIC PANEL
BUN: 10 mg/dL (ref 6–23)
CO2: 26 mEq/L (ref 19–32)
Calcium: 8.4 mg/dL (ref 8.4–10.5)
Chloride: 100 mEq/L (ref 96–112)
Creatinine, Ser: 0.72 mg/dL (ref 0.50–1.10)

## 2012-06-03 LAB — HEMOGLOBIN AND HEMATOCRIT, BLOOD: Hemoglobin: 10.1 g/dL — ABNORMAL LOW (ref 12.0–15.0)

## 2012-06-03 MED ORDER — METHOCARBAMOL 750 MG PO TABS: 750.0000 mg | ORAL_TABLET | Freq: Four times a day (QID) | ORAL | Status: DC | PRN

## 2012-06-03 MED ORDER — BISACODYL 10 MG RE SUPP
10.0000 mg | Freq: Once | RECTAL | Status: AC
  Administered 2012-06-03: 10 mg via RECTAL

## 2012-06-03 MED ORDER — WARFARIN SODIUM 5 MG PO TABS
5.0000 mg | ORAL_TABLET | Freq: Once | ORAL | Status: AC
  Administered 2012-06-03: 5 mg via ORAL
  Filled 2012-06-03: qty 1

## 2012-06-03 MED ORDER — WARFARIN SODIUM 5 MG PO TABS: 5.0000 mg | ORAL_TABLET | Freq: Every day | ORAL | Status: DC

## 2012-06-03 MED ORDER — ENOXAPARIN SODIUM 30 MG/0.3ML ~~LOC~~ SOLN: 30.0000 mg | Freq: Two times a day (BID) | SUBCUTANEOUS | Status: DC

## 2012-06-03 MED ORDER — OXYCODONE-ACETAMINOPHEN 5-325 MG PO TABS: 1.0000 | ORAL_TABLET | ORAL | Status: DC | PRN

## 2012-06-03 NOTE — Progress Notes (Signed)
[facility]  Patient Status: New  [facility] is providing the following services: RN and PT - note plan for D/C on Saturday.  Is on the schedule for RN and PT home visits on Sunday.  If patient discharges after hours, please call [phone].   Jodene Nam 06/03/2012, 3:35 PM

## 2012-06-03 NOTE — Progress Notes (Addendum)
ANTICOAGULATION CONSULT NOTE - Follow Up Consult  Pharmacy Consult:  Coumadin Indication:  VTE prophylaxis  Allergies  Allergen Reactions  . Tramadol Itching    Patient Measurements: Height: 5' 4.17" (163 cm) Weight: 163 lb 12.8 oz (74.3 kg) IBW/kg (Calculated) : 55.1  Vital Signs: Temp: 98.8 F (37.1 C) (03/21 1306) Temp src: Oral (03/21 2595) BP: 132/54 mmHg (03/21 1306) Pulse Rate: 78 (03/21 1306)  Labs:  Recent Labs  06/01/12 1526 06/02/12 0610 06/02/12 1214 06/03/12 0038 06/03/12 0641  HGB 11.0* 8.2* 8.3* 10.1* 9.7*  HCT 32.0* 23.5* 23.7* 28.5* 27.0*  PLT 256 210  --   --  160  LABPROT  --  14.6  --   --  16.9*  INR  --  1.16  --   --  1.41  CREATININE 0.71 0.83  --   --  0.72    Estimated Creatinine Clearance: 68.6 ml/min (by C-G formula based on Cr of 0.72).    Assessment: 66 YOF POD # 2 s/p left TKA ,on Lovenox and Coumadin for VTE prophylaxis.  INR 1.41 today , up from 1.16 yesterday; no bleeding reported. Hgb 9.7 today, after transfused 2 units PRBCs yesterday. No bleeding noted.  Ortho notes possible discharge home today or in AM.     Goal of Therapy:  INR 2-3 Monitor platelets by anticoagulation protocol: Yes    Plan:  - Ortho notes possible discharge home today on  Coumadin 5mg  PO daily and Lovenox 30 mg SQ q12h  until INR therapeutic. Outpatient Coumadin dosing/monitoring per home health pharmacist for VTE prophylaxis.  - F/U resume home when possible: losartan (not on inpatient but MD resumed on discharge orders).    Noah Delaine, RPh Clinical Pharmacist Pager: [phone] 06/03/2012, 1:24 PM  Addendum:  RN notes discharge plan for tomorrow. I will order coumadin dose 5mg  po x 1 tonight.  INR in AM.   Noah Delaine, RPh Clinical Pharmacist Pager: [phone] 06/03/2012 15:53

## 2012-06-03 NOTE — Care Management Note (Signed)
CARE MANAGEMENT NOTE 06/03/2012  Patient:  Alexis Ortega, Alexis Ortega   Account Number:  [id number]  Date Initiated:  06/03/2012  Documentation initiated by:  Vance Peper  Subjective/Objective Assessment:   67 yr old female s/p left total knee arthroplasty     Action/Plan:   Patient preoperatively setup with [facility], no changes. Has rolling walker, 3in1 and CPM.   Anticipated DC Date:  06/03/2012   Anticipated DC Plan:  HOME W HOME HEALTH SERVICES      DC Planning Services  CM consult      [facility] Choice  HOME HEALTH   Choice offered to / List presented to:  C-1 Patient        HH arranged  HH-2 PT  HH-1 RN      [facility] agency  [facility]   Status of service:  Completed, signed off Medicare Important Message given?   (If response is "NO", the following Medicare IM given date fields will be blank) Date Medicare IM given:   Date Additional Medicare IM given:    Discharge Disposition:  HOME W HOME HEALTH SERVICES  Per UR Regulation:    If discussed at Long Length of Stay Meetings, dates discussed:    Comments:

## 2012-06-03 NOTE — Progress Notes (Signed)
Physical Therapy Treatment Patient Details Name: Alexis Ortega MRN: [id number] DOB: April 29, 1945 Today's Date: 06/03/2012 Time: 1478-2956 PT Time Calculation (min): 31 min  PT Assessment / Plan / Recommendation Comments on Treatment Session  Patient s/p L TKA. Patient recieved two units of PRBCs yesterday and states today that she feels better. Able to ambulate a little further this morning. Patient having increased diffuculty with sit to stands but is trying to get up as much as possible throughout the day with staff.     Follow Up Recommendations  Home health PT     Does the patient have the potential to tolerate intense rehabilitation     Barriers to Discharge        Equipment Recommendations  None recommended by PT    Recommendations for Other Services    Frequency 7X/week   Plan Discharge plan remains appropriate;Frequency remains appropriate    Precautions / Restrictions Precautions Precautions: Fall Required Braces or Orthoses: Knee Immobilizer - Left Knee Immobilizer - Left: On except when in CPM   Pertinent Vitals/Pain     Mobility  Bed Mobility Supine to Sit: 4: Min assist;HOB elevated Sitting - Scoot to Edge of Bed: 4: Min guard Details for Bed Mobility Assistance: assist for LLE, cues for positioning Transfers Sit to Stand: 4: Min assist;With upper extremity assist;From bed;From chair/3-in-1 Stand to Sit: To chair/3-in-1;With upper extremity assist;To bed;4: Min guard Details for Transfer Assistance: Verbal cues for hand placement assist to steady patient with initial stand Ambulation/Gait Ambulation/Gait Assistance: 4: Min guard Ambulation Distance (Feet): 100 Feet Assistive device: Rolling walker Ambulation/Gait Assistance Details: Cues for posture and to keep RW on the ground Gait Pattern: Step-to pattern;Antalgic    Exercises Total Joint Exercises Quad Sets: AAROM;Left;10 reps Heel Slides: AAROM;Left;10 reps Hip ABduction/ADduction: AAROM;Left;10  reps Straight Leg Raises: AAROM;Left;10 reps   PT Diagnosis:    PT Problem List:   PT Treatment Interventions:     PT Goals Acute Rehab PT Goals PT Goal: Supine/Side to Sit - Progress: Progressing toward goal PT Transfer Goal: Bed to Chair/Chair to Bed - Progress: Progressing toward goal PT Goal: Ambulate - Progress: Progressing toward goal PT Goal: Perform Home Exercise Program - Progress: Progressing toward goal  Visit Information  Last PT Received On: 06/03/12 Assistance Needed: +1    Subjective Data      Cognition  Cognition Overall Cognitive Status: Appears within functional limits for tasks assessed/performed Arousal/Alertness: Awake/alert Orientation Level: Appears intact for tasks assessed Behavior During Session: [facility] for tasks performed    Balance     End of Session PT - End of Session Equipment Utilized During Treatment: Gait belt;Right knee immobilizer Activity Tolerance: Patient tolerated treatment well Patient left: with call bell/phone within reach;with family/visitor present;in chair Nurse Communication: Mobility status   GP     Fredrich Birks 06/03/2012, 9:38 AM 06/03/2012 Fredrich Birks PTA [phone] pager [phone] office

## 2012-06-03 NOTE — Progress Notes (Signed)
Subjective: Doing ok.  Better pain control today.  No bm yet.  +flatus.  No abd pain.     Objective: Vital signs in last 24 hours: Temp:  [97.4 F (36.3 C)-99.2 F (37.3 C)] 98.7 F (37.1 C) (03/21 0638) Pulse Rate:  [64-75] 73 (03/21 0638) Resp:  [16-20] 18 (03/21 1610) BP: (103-124)/(29-64) 117/53 mmHg (03/21 1023) SpO2:  [93 %-100 %] 95 % (03/21 9604)  Intake/Output from previous day: 03/20 0701 - 03/21 0700 In: 1641.2 [I.V.:1357.2] Out: 1475 [Urine:1400; Drains:75] Intake/Output this shift: Total I/O In: 360 [P.O.:360] Out: -    Recent Labs  06/01/12 1526 06/02/12 0610 06/02/12 1214 06/03/12 0038 06/03/12 0641  HGB 11.0* 8.2* 8.3* 10.1* 9.7*    Recent Labs  06/02/12 0610  06/03/12 0038 06/03/12 0641  WBC 12.8*  --   --  9.3  RBC 2.58*  --   --  3.04*  HCT 23.5*  < > 28.5* 27.0*  PLT 210  --   --  160  < > = values in this interval not displayed.  Recent Labs  06/02/12 0610 06/03/12 0641  NA 128* 133*  K 4.7 4.2  CL 96 100  CO2 26 26  BUN 11 10  CREATININE 0.83 0.72  GLUCOSE 132* 101*  CALCIUM 8.6 8.4    Recent Labs  06/02/12 0610 06/03/12 0641  INR 1.16 1.41    Exam:  Wound looks good. Staples intact.  No drainage or signs   Drain removed.  Calf nt, nvi.    Assessment/Plan: Give dulcolax supp now.  Continue PT.  Possible d/c home this afternoon or Saturday.     Mega Kinkade M 06/03/2012, 1:06 PM

## 2012-06-03 NOTE — Progress Notes (Signed)
Physical Therapy Treatment Patient Details Name: Alexis Ortega MRN: [id number] DOB: 07-19-1945 Today's Date: 06/03/2012 Time: 6295-2841 PT Time Calculation (min): 47 min  PT Assessment / Plan / Recommendation Comments on Treatment Session  Patient s/p L TKA. Patient progressing well with all mobility this afternoon. Anticipate discharge in AM.     Follow Up Recommendations  Home health PT     Does the patient have the potential to tolerate intense rehabilitation     Barriers to Discharge        Equipment Recommendations  None recommended by PT    Recommendations for Other Services    Frequency 7X/week   Plan Discharge plan remains appropriate;Frequency remains appropriate    Precautions / Restrictions Precautions Precautions: Fall Required Braces or Orthoses: Knee Immobilizer - Left Knee Immobilizer - Left: On except when in CPM Restrictions Weight Bearing Restrictions: Yes LLE Weight Bearing: Weight bearing as tolerated   Pertinent Vitals/Pain     Mobility  Bed Mobility Supine to Sit: 5: Supervision Sitting - Scoot to Edge of Bed: 5: Supervision Sit to Supine: 4: Min assist Details for Bed Mobility Assistance: assist for LLE back into bed Transfers Sit to Stand: 4: Min guard;With upper extremity assist;From bed;From chair/3-in-1 Stand to Sit: To chair/3-in-1;To bed;4: Min guard;With upper extremity assist Details for Transfer Assistance: No cues required. MinGuard for sfaety Ambulation/Gait Ambulation/Gait Assistance: 4: Min guard Ambulation Distance (Feet): 110 Feet Assistive device: Rolling walker Gait Pattern: Step-to pattern Gait velocity: increasing    Exercises Total Joint Exercises Quad Sets: AAROM;Left;10 reps Heel Slides: AAROM;Left;10 reps Hip ABduction/ADduction: AAROM;Left;10 reps Straight Leg Raises: AAROM;Left;10 reps   PT Diagnosis:    PT Problem List:   PT Treatment Interventions:     PT Goals Acute Rehab PT Goals PT Goal: Supine/Side  to Sit - Progress: Progressing toward goal PT Goal: Sit to Supine/Side - Progress: Progressing toward goal PT Transfer Goal: Bed to Chair/Chair to Bed - Progress: Progressing toward goal PT Goal: Ambulate - Progress: Progressing toward goal PT Goal: Perform Home Exercise Program - Progress: Progressing toward goal  Visit Information  Last PT Received On: 06/03/12 Assistance Needed: +1    Subjective Data      Cognition  Cognition Overall Cognitive Status: Appears within functional limits for tasks assessed/performed Arousal/Alertness: Awake/alert Orientation Level: Appears intact for tasks assessed Behavior During Session: [facility] for tasks performed    Balance     End of Session PT - End of Session Equipment Utilized During Treatment: Gait belt;Right knee immobilizer Activity Tolerance: Patient tolerated treatment well Patient left: with call bell/phone within reach;with family/visitor present;in bed CPM Left Knee CPM Left Knee: On Additional Comments:  (pt in bone foam when up to chair)   GP     Robinette, Adline Potter 06/03/2012, 2:10 PM 06/03/2012 Fredrich Birks PTA [phone] pager [phone] office

## 2012-06-04 LAB — CBC
HCT: 26.7 % — ABNORMAL LOW (ref 36.0–46.0)
Hemoglobin: 9.5 g/dL — ABNORMAL LOW (ref 12.0–15.0)
MCH: 32.2 pg (ref 26.0–34.0)
MCHC: 35.6 g/dL (ref 30.0–36.0)
MCV: 90.5 fL (ref 78.0–100.0)

## 2012-06-04 LAB — BASIC METABOLIC PANEL
BUN: 7 mg/dL (ref 6–23)
Calcium: 8.5 mg/dL (ref 8.4–10.5)
Creatinine, Ser: 0.69 mg/dL (ref 0.50–1.10)
GFR calc non Af Amer: 89 mL/min — ABNORMAL LOW (ref >90)
Glucose, Bld: 90 mg/dL (ref 70–99)

## 2012-06-04 NOTE — Progress Notes (Signed)
Patient reports 7 out of 0-10 pain level prior to PRN Perocet 2 tables PO q4hrs is due.  Patient stated she did not want to have an anxiety attack d/t pain, even with scheduled Xanax 0.5mg  QHS given.  Pain medication administered earlier d/t patient concerns.  Patient is now ambulating in hall, CPM to be administered for 3hrs after ambulation.  Ice and immobilizer applied also.  Will continue to monitor for status changes.

## 2012-06-04 NOTE — Discharge Summary (Signed)
PATIENT ID: Alexis Ortega        MRN:  [id number]          DOB/AGE: 1946/02/17 / 67 y.o.    DISCHARGE SUMMARY  ADMISSION DATE:    06/01/2012 DISCHARGE DATE:   06/04/2012   ADMISSION DIAGNOSIS: DJD LEFT KNEE    DISCHARGE DIAGNOSIS:  DJD LEFT KNEE    ADDITIONAL DIAGNOSIS: Active Problems:   * No active hospital problems. *  Past Medical History  Diagnosis Date  . Hypertension   . Thyroid disease   . Hypothyroidism   . Depression   . GERD (gastroesophageal reflux disease)   . Arthritis   . Fibromyalgia     PROCEDURE: Procedure(s): LEFT TOTAL KNEE ARTHROPLASTY Lefton 06/01/2012  CONSULTS: PT/OT      HISTORY:  See H&P in chart  HOSPITAL COURSE:  Alexis Ortega is a 67 y.o. admitted on 06/01/2012 and found to have a diagnosis of DJD LEFT KNEE.  After appropriate laboratory studies were obtained  they were taken to the operating room on 06/01/2012 and underwent  Procedure(s): LEFT TOTAL KNEE ARTHROPLASTY Left.   They were given perioperative antibiotics:  Anti-infectives   Start     Dose/Rate Route Frequency Ordered Stop   06/01/12 1445  ceFAZolin (ANCEF) IVPB 1 g/50 mL premix     1 g 100 mL/hr over 30 Minutes Intravenous 3 times per day 06/01/12 1443 06/02/12 0559   06/01/12 0600  ceFAZolin (ANCEF) IVPB 2 g/50 mL premix     2 g 100 mL/hr over 30 Minutes Intravenous On call to O.R. 05/31/12 1435 06/01/12 1005    .  Tolerated the procedure well.  Placed with a foley intraoperatively.    POD #1, allowed out of bed to a chair.  PT for ambulation and exercise program.  Foley D/C'd in morning.  IV saline locked.  O2 discontionued.  POD #2, continued PT and ambulation.   Hemovac pulled. .  The remainder of the hospital course was dedicated to ambulation and strengthening.   The patient was discharged on 3 Days Post-Op in  Stable condition.  Blood products given: 2 units PRBCs  DIAGNOSTIC STUDIES: Recent vital signs: Patient Vitals for the past 24 hrs:  BP Temp Pulse  Resp SpO2  06/04/12 0608 114/47 mmHg 98.1 F (36.7 C) 67 20 94 %  06/04/12 0400 - - - 16 99 %  06/04/12 0000 - - - 16 99 %  06/03/12 2130 125/56 mmHg 98.5 F (36.9 C) 74 18 99 %  06/03/12 2000 - - - 18 99 %  06/03/12 1306 132/54 mmHg 98.8 F (37.1 C) 78 18 99 %  06/03/12 1023 117/53 mmHg - - - -       Recent laboratory studies:  Recent Labs  06/01/12 1526 06/02/12 0610 06/02/12 1214 06/03/12 0038 06/03/12 0641 06/04/12 0545  WBC 19.1* 12.8*  --   --  9.3 8.0  HGB 11.0* 8.2* 8.3* 10.1* 9.7* 9.5*  HCT 32.0* 23.5* 23.7* 28.5* 27.0* 26.7*  PLT 256 210  --   --  160 169    Recent Labs  06/01/12 1526 06/02/12 0610 06/03/12 0641 06/04/12 0545  NA  --  128* 133* 136  K  --  4.7 4.2 4.0  CL  --  96 100 100  CO2  --  26 26 29   BUN  --  11 10 7   CREATININE 0.71 0.83 0.72 0.69  GLUCOSE  --  132* 101* 90  CALCIUM  --  8.6 8.4 8.5   Lab Results  Component Value Date   INR 1.51* 06/04/2012   INR 1.41 06/03/2012   INR 1.16 06/02/2012     Recent Radiographic Studies :  Dg Knee Left Port  06/01/2012  *RADIOLOGY REPORT*  Clinical Data: Knee replacement.  PORTABLE LEFT KNEE - 1-2 VIEW  Comparison: MRI 12/30/2011.  Findings: The patient has a new total knee arthroplasty.  The device is located.  There is no fracture.  Gas in the soft tissues, surgical drain and staples are noted.  IMPRESSION: Left total knee replacement without evidence of complication.   Original Report Authenticated By: Holley Dexter, M.D.     DISCHARGE INSTRUCTIONS: Discharge Orders   Future Orders Complete By Expires     CPM  As directed     Comments:      Continuous passive motion machine (CPM):      Use the CPM from 0 to 70 degrees for 6-8 hours per day.      You may increase by 10 degrees per day as tolerated.  You may break it up into 2 or 3 sessions per day.      Use CPM for 3-4 weeks or until you are told to stop.    Call MD / Call 911  As directed     Comments:      If you experience chest pain  or shortness of breath, CALL 911 and be transported to the hospital emergency room.  If you develope a fever above 101 F, pus (white drainage) or increased drainage or redness at the wound, or calf pain, call your surgeon's office.    Change dressing  As directed     Comments:      Change dressing on left knee daily with sterile 4 x 4 inch gauze dressing and apply TED hose.    Constipation Prevention  As directed     Comments:      Drink plenty of fluids.  Prune juice may be helpful.  You may use a stool softener, such as Colace (over the counter) 100 mg twice a day.  Use MiraLax (over the counter) for constipation as needed.    Diet - low sodium heart healthy  As directed     Discharge instructions  As directed     Comments:      Ok to shower, but no tub soaking. Can remove entire dressing and you do not have to wrap anything around knee.  Do not apply any creams or ointments to incision.  Continue physical therapy protocol.    Do not put a pillow under the knee. Place it under the heel.  As directed     Driving restrictions  As directed     Comments:      No driving until further notice.    Increase activity slowly as tolerated  As directed     Lifting restrictions  As directed     Comments:      No lifting until further notice.    TED hose  As directed     Comments:      Use stockings (TED hose) for 3-4 weeks on both leg(s).  You may remove them at night for sleeping.       DISCHARGE MEDICATIONS:     Medication List    STOP taking these medications       aspirin 81 MG tablet     multivitamin with minerals Tabs     VITAMIN C PO  TAKE these medications       ALPRAZolam 0.5 MG tablet  Commonly known as:  XANAX  Take 0.5 mg by mouth at bedtime as needed for sleep.     amLODipine 10 MG tablet  Commonly known as:  NORVASC  Take 5 mg by mouth daily.     CALCIUM PO  Take 1 tablet by mouth daily.     citalopram 20 MG tablet  Commonly known as:  CELEXA  Take 20 mg by  mouth daily.     enoxaparin 30 MG/0.3ML injection  Commonly known as:  LOVENOX  Inject 0.3 mLs (30 mg total) into the skin every 12 (twelve) hours.     levothyroxine 75 MCG tablet  Commonly known as:  SYNTHROID, LEVOTHROID  Take 75 mcg by mouth daily.     losartan 100 MG tablet  Commonly known as:  COZAAR  Take 100 mg by mouth daily.     methocarbamol 750 MG tablet  Commonly known as:  ROBAXIN  Take 1 tablet (750 mg total) by mouth every 6 (six) hours as needed (spasms).     oxyCODONE-acetaminophen 5-325 MG per tablet  Commonly known as:  PERCOCET/ROXICET  Take 1-2 tablets by mouth every 4 (four) hours as needed for pain.     warfarin 5 MG tablet  Commonly known as:  COUMADIN  Take 1 tablet (5 mg total) by mouth daily.        FOLLOW UP VISIT:       Follow-up Information   Schedule an appointment as soon as possible for a visit with Loreta Ave, MD. (need return office visit 2 weeks postop. )    Contact information:   [address] [place] [address] [phone]       DISPOSITION:   Home  CONDITION:  Stable   Margart Sickles 06/04/2012, 9:34 AM

## 2012-06-04 NOTE — Progress Notes (Signed)
Subjective: 3 Days Post-Op Procedure(s) (LRB): LEFT TOTAL KNEE ARTHROPLASTY (Left) Patient reports pain as mild and moderate.    Objective: Vital signs in last 24 hours: Temp:  [98.1 F (36.7 C)-98.8 F (37.1 C)] 98.1 F (36.7 C) (03/22 0608) Pulse Rate:  [67-78] 67 (03/22 0608) Resp:  [16-20] 20 (03/22 1610) BP: (114-132)/(47-56) 114/47 mmHg (03/22 0608) SpO2:  [94 %-99 %] 94 % (03/22 9604)  Intake/Output from previous day: 03/21 0701 - 03/22 0700 In: 840 [P.O.:840] Out: -  Intake/Output this shift:     Recent Labs  06/02/12 0610 06/02/12 1214 06/03/12 0038 06/03/12 0641 06/04/12 0545  HGB 8.2* 8.3* 10.1* 9.7* 9.5*    Recent Labs  06/03/12 0641 06/04/12 0545  WBC 9.3 8.0  RBC 3.04* 2.95*  HCT 27.0* 26.7*  PLT 160 169    Recent Labs  06/03/12 0641 06/04/12 0545  NA 133* 136  K 4.2 4.0  CL 100 100  CO2 26 29  BUN 10 7  CREATININE 0.72 0.69  GLUCOSE 101* 90  CALCIUM 8.4 8.5    Recent Labs  06/03/12 0641 06/04/12 0545  INR 1.41 1.51*    Neurovascular intact Sensation intact distally Intact pulses distally Dorsiflexion/Plantar flexion intact Incision: dressing C/D/I and scant drainage No cellulitis present Compartment soft Dressing changed  Assessment/Plan: 3 Days Post-Op Procedure(s) (LRB): LEFT TOTAL KNEE ARTHROPLASTY (Left) Up with therapy Discharge home with home health  Margart Sickles 06/04/2012, 9:31 AM

## 2012-06-04 NOTE — Progress Notes (Signed)
ANTICOAGULATION CONSULT NOTE - Follow Up Consult  Pharmacy Consult:  Coumadin Indication:  VTE prophylaxis  Allergies  Allergen Reactions  . Tramadol Itching    Patient Measurements: Height: 5' 4.17" (163 cm) Weight: 163 lb 12.8 oz (74.3 kg) IBW/kg (Calculated) : 55.1  Vital Signs: Temp: 98.1 F (36.7 C) (03/22 0608) BP: 124/45 mmHg (03/22 1017) Pulse Rate: 67 (03/22 0608)  Labs:  Recent Labs  06/02/12 0610  06/03/12 0038 06/03/12 0641 06/04/12 0545  HGB 8.2*  < > 10.1* 9.7* 9.5*  HCT 23.5*  < > 28.5* 27.0* 26.7*  PLT 210  --   --  160 169  LABPROT 14.6  --   --  16.9* 17.8*  INR 1.16  --   --  1.41 1.51*  CREATININE 0.83  --   --  0.72 0.69  < > = values in this interval not displayed.  Estimated Creatinine Clearance: 68.6 ml/min (by C-G formula based on Cr of 0.69).       Assessment: 79 YOF s/p left TKA, POD#3, on Lovenox and Coumadin for VTE prophylaxis.  INR remains sub-therapeutic but is trending up.  Patient's renal function is stable.  No bleeding reported.  Noted discharge summary completed.   Goal of Therapy:  INR 2-3 Monitor platelets by anticoagulation protocol: Yes    Plan:  - Repeat Coumadin 5mg  PO today if still here - Continue Lovenox until INR therapeutic - Daily PT / INR - Ortho notes discharge home today on Coumadin 5mg  PO daily and Lovenox 30 mg SQ q12h until INR therapeutic.  Outpatient Coumadin dosing/monitoring per home health pharmacist     Keionna Kinnaird D. Laney Potash, PharmD, BCPS Pager:  [phone] 06/04/2012, 11:08 AM

## 2012-06-04 NOTE — Progress Notes (Signed)
Physical Therapy Treatment Patient Details Name: Alexis Ortega MRN: [id number] DOB: [birth year] Today's Date: 06/04/2012 Time: 4098-1191 PT Time Calculation (min): 29 min  PT Assessment / Plan / Recommendation Comments on Treatment Session  Pt reports she was in CPM for 6 hrs last night.  C/o significant discomfort today but was able to ambulate ~130'.      Follow Up Recommendations  Home health PT     Does the patient have the potential to tolerate intense rehabilitation     Barriers to Discharge        Equipment Recommendations  None recommended by PT    Recommendations for Other Services    Frequency 7X/week   Plan Discharge plan remains appropriate;Frequency remains appropriate    Precautions / Restrictions Precautions Precautions: Fall Restrictions Weight Bearing Restrictions: Yes LLE Weight Bearing: Weight bearing as tolerated   Pertinent Vitals/Pain C/o Lt knee pain 7/10 premedicated.   Questioned if pain more significant then previous PT sessions but pt not giving clear answer "I don't really know".      Mobility  Bed Mobility Bed Mobility: Not assessed Transfers Transfers: Sit to Stand;Stand to Sit Sit to Stand: 4: Min guard;With upper extremity assist;With armrests;From chair/3-in-1;From toilet Stand to Sit: 4: Min guard;With upper extremity assist;With armrests;To chair/3-in-1;To toilet Details for Transfer Assistance: Guarding for safety.  Pt slow to achieve standing due to pain.   Ambulation/Gait Ambulation/Gait Assistance: 4: Min guard Ambulation Distance (Feet): 130 Feet Assistive device: Rolling walker Ambulation/Gait Assistance Details: Cues for sequencing, tall posture, & increased lateral weightshiting to Lt side.   Gait Pattern: Step-to pattern;Lateral trunk lean to right;Decreased weight shift to left;Decreased step length - right;Trunk flexed      PT Goals Acute Rehab PT Goals PT Goal Formulation: With patient Time For Goal Achievement:  06/08/12 Potential to Achieve Goals: Good Pt will go Supine/Side to Sit: with modified independence Pt will go Sit to Supine/Side: with modified independence Pt will Transfer Bed to Chair/Chair to Bed: with modified independence Pt will Ambulate: >150 feet;with supervision;with rolling walker PT Goal: Ambulate - Progress: Progressing toward goal Pt will Perform Home Exercise Program: with supervision, verbal cues required/provided  Visit Information  Last PT Received On: 06/04/12 Assistance Needed: +1    Subjective Data      Cognition  Cognition Overall Cognitive Status: Appears within functional limits for tasks assessed/performed Arousal/Alertness: Awake/alert Orientation Level: Appears intact for tasks assessed Behavior During Session: [employer] for tasks performed    Balance     End of Session PT - End of Session Equipment Utilized During Treatment: Gait belt;Left knee immobilizer Activity Tolerance: Patient tolerated treatment well Patient left: in chair;with call bell/phone within reach Nurse Communication: Mobility status CPM Left Knee CPM Left Knee: On     Verdell Face, [place] [phone] 06/04/2012

## 2012-06-05 DIAGNOSIS — K219 Gastro-esophageal reflux disease without esophagitis: Secondary | ICD-10-CM | POA: Diagnosis not present

## 2012-06-05 DIAGNOSIS — I10 Essential (primary) hypertension: Secondary | ICD-10-CM | POA: Diagnosis not present

## 2012-06-05 DIAGNOSIS — Z96659 Presence of unspecified artificial knee joint: Secondary | ICD-10-CM | POA: Diagnosis not present

## 2012-06-05 DIAGNOSIS — Z471 Aftercare following joint replacement surgery: Secondary | ICD-10-CM | POA: Diagnosis not present

## 2012-06-05 DIAGNOSIS — J45909 Unspecified asthma, uncomplicated: Secondary | ICD-10-CM | POA: Diagnosis not present

## 2012-06-05 DIAGNOSIS — M159 Polyosteoarthritis, unspecified: Secondary | ICD-10-CM | POA: Diagnosis not present

## 2012-06-05 DIAGNOSIS — IMO0001 Reserved for inherently not codable concepts without codable children: Secondary | ICD-10-CM | POA: Diagnosis not present

## 2012-06-05 DIAGNOSIS — Z7901 Long term (current) use of anticoagulants: Secondary | ICD-10-CM | POA: Diagnosis not present

## 2012-06-05 DIAGNOSIS — Z5181 Encounter for therapeutic drug level monitoring: Secondary | ICD-10-CM | POA: Diagnosis not present

## 2012-06-05 DIAGNOSIS — F329 Major depressive disorder, single episode, unspecified: Secondary | ICD-10-CM | POA: Diagnosis not present

## 2012-06-06 DIAGNOSIS — IMO0001 Reserved for inherently not codable concepts without codable children: Secondary | ICD-10-CM | POA: Diagnosis not present

## 2012-06-06 DIAGNOSIS — Z471 Aftercare following joint replacement surgery: Secondary | ICD-10-CM | POA: Diagnosis not present

## 2012-06-06 DIAGNOSIS — I10 Essential (primary) hypertension: Secondary | ICD-10-CM | POA: Diagnosis not present

## 2012-06-06 DIAGNOSIS — K219 Gastro-esophageal reflux disease without esophagitis: Secondary | ICD-10-CM | POA: Diagnosis not present

## 2012-06-06 DIAGNOSIS — M159 Polyosteoarthritis, unspecified: Secondary | ICD-10-CM | POA: Diagnosis not present

## 2012-06-06 DIAGNOSIS — J45909 Unspecified asthma, uncomplicated: Secondary | ICD-10-CM | POA: Diagnosis not present

## 2012-06-07 DIAGNOSIS — Z471 Aftercare following joint replacement surgery: Secondary | ICD-10-CM | POA: Diagnosis not present

## 2012-06-07 DIAGNOSIS — K219 Gastro-esophageal reflux disease without esophagitis: Secondary | ICD-10-CM | POA: Diagnosis not present

## 2012-06-07 DIAGNOSIS — I10 Essential (primary) hypertension: Secondary | ICD-10-CM | POA: Diagnosis not present

## 2012-06-07 DIAGNOSIS — M159 Polyosteoarthritis, unspecified: Secondary | ICD-10-CM | POA: Diagnosis not present

## 2012-06-07 DIAGNOSIS — J45909 Unspecified asthma, uncomplicated: Secondary | ICD-10-CM | POA: Diagnosis not present

## 2012-06-07 DIAGNOSIS — IMO0001 Reserved for inherently not codable concepts without codable children: Secondary | ICD-10-CM | POA: Diagnosis not present

## 2012-06-08 DIAGNOSIS — Z471 Aftercare following joint replacement surgery: Secondary | ICD-10-CM | POA: Diagnosis not present

## 2012-06-08 DIAGNOSIS — J45909 Unspecified asthma, uncomplicated: Secondary | ICD-10-CM | POA: Diagnosis not present

## 2012-06-08 DIAGNOSIS — M159 Polyosteoarthritis, unspecified: Secondary | ICD-10-CM | POA: Diagnosis not present

## 2012-06-08 DIAGNOSIS — I10 Essential (primary) hypertension: Secondary | ICD-10-CM | POA: Diagnosis not present

## 2012-06-08 DIAGNOSIS — K219 Gastro-esophageal reflux disease without esophagitis: Secondary | ICD-10-CM | POA: Diagnosis not present

## 2012-06-08 DIAGNOSIS — IMO0001 Reserved for inherently not codable concepts without codable children: Secondary | ICD-10-CM | POA: Diagnosis not present

## 2012-06-09 DIAGNOSIS — Z471 Aftercare following joint replacement surgery: Secondary | ICD-10-CM | POA: Diagnosis not present

## 2012-06-09 DIAGNOSIS — K219 Gastro-esophageal reflux disease without esophagitis: Secondary | ICD-10-CM | POA: Diagnosis not present

## 2012-06-09 DIAGNOSIS — IMO0001 Reserved for inherently not codable concepts without codable children: Secondary | ICD-10-CM | POA: Diagnosis not present

## 2012-06-09 DIAGNOSIS — I10 Essential (primary) hypertension: Secondary | ICD-10-CM | POA: Diagnosis not present

## 2012-06-09 DIAGNOSIS — M159 Polyosteoarthritis, unspecified: Secondary | ICD-10-CM | POA: Diagnosis not present

## 2012-06-09 DIAGNOSIS — J45909 Unspecified asthma, uncomplicated: Secondary | ICD-10-CM | POA: Diagnosis not present

## 2012-06-10 DIAGNOSIS — Z471 Aftercare following joint replacement surgery: Secondary | ICD-10-CM | POA: Diagnosis not present

## 2012-06-10 DIAGNOSIS — M159 Polyosteoarthritis, unspecified: Secondary | ICD-10-CM | POA: Diagnosis not present

## 2012-06-10 DIAGNOSIS — I10 Essential (primary) hypertension: Secondary | ICD-10-CM | POA: Diagnosis not present

## 2012-06-10 DIAGNOSIS — J45909 Unspecified asthma, uncomplicated: Secondary | ICD-10-CM | POA: Diagnosis not present

## 2012-06-10 DIAGNOSIS — IMO0001 Reserved for inherently not codable concepts without codable children: Secondary | ICD-10-CM | POA: Diagnosis not present

## 2012-06-10 DIAGNOSIS — K219 Gastro-esophageal reflux disease without esophagitis: Secondary | ICD-10-CM | POA: Diagnosis not present

## 2012-06-13 DIAGNOSIS — M159 Polyosteoarthritis, unspecified: Secondary | ICD-10-CM | POA: Diagnosis not present

## 2012-06-13 DIAGNOSIS — I10 Essential (primary) hypertension: Secondary | ICD-10-CM | POA: Diagnosis not present

## 2012-06-13 DIAGNOSIS — J45909 Unspecified asthma, uncomplicated: Secondary | ICD-10-CM | POA: Diagnosis not present

## 2012-06-13 DIAGNOSIS — K219 Gastro-esophageal reflux disease without esophagitis: Secondary | ICD-10-CM | POA: Diagnosis not present

## 2012-06-13 DIAGNOSIS — IMO0001 Reserved for inherently not codable concepts without codable children: Secondary | ICD-10-CM | POA: Diagnosis not present

## 2012-06-13 DIAGNOSIS — Z471 Aftercare following joint replacement surgery: Secondary | ICD-10-CM | POA: Diagnosis not present

## 2012-06-14 DIAGNOSIS — IMO0001 Reserved for inherently not codable concepts without codable children: Secondary | ICD-10-CM | POA: Diagnosis not present

## 2012-06-14 DIAGNOSIS — I10 Essential (primary) hypertension: Secondary | ICD-10-CM | POA: Diagnosis not present

## 2012-06-14 DIAGNOSIS — K219 Gastro-esophageal reflux disease without esophagitis: Secondary | ICD-10-CM | POA: Diagnosis not present

## 2012-06-14 DIAGNOSIS — J45909 Unspecified asthma, uncomplicated: Secondary | ICD-10-CM | POA: Diagnosis not present

## 2012-06-14 DIAGNOSIS — Z471 Aftercare following joint replacement surgery: Secondary | ICD-10-CM | POA: Diagnosis not present

## 2012-06-14 DIAGNOSIS — M159 Polyosteoarthritis, unspecified: Secondary | ICD-10-CM | POA: Diagnosis not present

## 2012-06-15 DIAGNOSIS — Z471 Aftercare following joint replacement surgery: Secondary | ICD-10-CM | POA: Diagnosis not present

## 2012-06-15 DIAGNOSIS — K219 Gastro-esophageal reflux disease without esophagitis: Secondary | ICD-10-CM | POA: Diagnosis not present

## 2012-06-15 DIAGNOSIS — M159 Polyosteoarthritis, unspecified: Secondary | ICD-10-CM | POA: Diagnosis not present

## 2012-06-15 DIAGNOSIS — I10 Essential (primary) hypertension: Secondary | ICD-10-CM | POA: Diagnosis not present

## 2012-06-15 DIAGNOSIS — J45909 Unspecified asthma, uncomplicated: Secondary | ICD-10-CM | POA: Diagnosis not present

## 2012-06-15 DIAGNOSIS — IMO0001 Reserved for inherently not codable concepts without codable children: Secondary | ICD-10-CM | POA: Diagnosis not present

## 2012-06-16 DIAGNOSIS — I10 Essential (primary) hypertension: Secondary | ICD-10-CM | POA: Diagnosis not present

## 2012-06-16 DIAGNOSIS — M159 Polyosteoarthritis, unspecified: Secondary | ICD-10-CM | POA: Diagnosis not present

## 2012-06-16 DIAGNOSIS — J45909 Unspecified asthma, uncomplicated: Secondary | ICD-10-CM | POA: Diagnosis not present

## 2012-06-16 DIAGNOSIS — Z471 Aftercare following joint replacement surgery: Secondary | ICD-10-CM | POA: Diagnosis not present

## 2012-06-16 DIAGNOSIS — IMO0001 Reserved for inherently not codable concepts without codable children: Secondary | ICD-10-CM | POA: Diagnosis not present

## 2012-06-16 DIAGNOSIS — K219 Gastro-esophageal reflux disease without esophagitis: Secondary | ICD-10-CM | POA: Diagnosis not present

## 2012-06-20 DIAGNOSIS — J45909 Unspecified asthma, uncomplicated: Secondary | ICD-10-CM | POA: Diagnosis not present

## 2012-06-20 DIAGNOSIS — IMO0001 Reserved for inherently not codable concepts without codable children: Secondary | ICD-10-CM | POA: Diagnosis not present

## 2012-06-20 DIAGNOSIS — K219 Gastro-esophageal reflux disease without esophagitis: Secondary | ICD-10-CM | POA: Diagnosis not present

## 2012-06-20 DIAGNOSIS — M159 Polyosteoarthritis, unspecified: Secondary | ICD-10-CM | POA: Diagnosis not present

## 2012-06-20 DIAGNOSIS — I10 Essential (primary) hypertension: Secondary | ICD-10-CM | POA: Diagnosis not present

## 2012-06-20 DIAGNOSIS — Z471 Aftercare following joint replacement surgery: Secondary | ICD-10-CM | POA: Diagnosis not present

## 2012-06-22 DIAGNOSIS — M159 Polyosteoarthritis, unspecified: Secondary | ICD-10-CM | POA: Diagnosis not present

## 2012-06-22 DIAGNOSIS — IMO0001 Reserved for inherently not codable concepts without codable children: Secondary | ICD-10-CM | POA: Diagnosis not present

## 2012-06-22 DIAGNOSIS — Z471 Aftercare following joint replacement surgery: Secondary | ICD-10-CM | POA: Diagnosis not present

## 2012-06-22 DIAGNOSIS — K219 Gastro-esophageal reflux disease without esophagitis: Secondary | ICD-10-CM | POA: Diagnosis not present

## 2012-06-22 DIAGNOSIS — I10 Essential (primary) hypertension: Secondary | ICD-10-CM | POA: Diagnosis not present

## 2012-06-22 DIAGNOSIS — J45909 Unspecified asthma, uncomplicated: Secondary | ICD-10-CM | POA: Diagnosis not present

## 2012-06-23 DIAGNOSIS — I10 Essential (primary) hypertension: Secondary | ICD-10-CM | POA: Diagnosis not present

## 2012-06-23 DIAGNOSIS — J45909 Unspecified asthma, uncomplicated: Secondary | ICD-10-CM | POA: Diagnosis not present

## 2012-06-23 DIAGNOSIS — IMO0001 Reserved for inherently not codable concepts without codable children: Secondary | ICD-10-CM | POA: Diagnosis not present

## 2012-06-23 DIAGNOSIS — K219 Gastro-esophageal reflux disease without esophagitis: Secondary | ICD-10-CM | POA: Diagnosis not present

## 2012-06-23 DIAGNOSIS — M159 Polyosteoarthritis, unspecified: Secondary | ICD-10-CM | POA: Diagnosis not present

## 2012-06-23 DIAGNOSIS — Z471 Aftercare following joint replacement surgery: Secondary | ICD-10-CM | POA: Diagnosis not present

## 2012-06-24 DIAGNOSIS — R5381 Other malaise: Secondary | ICD-10-CM | POA: Diagnosis not present

## 2012-06-24 DIAGNOSIS — K219 Gastro-esophageal reflux disease without esophagitis: Secondary | ICD-10-CM | POA: Diagnosis not present

## 2012-06-24 DIAGNOSIS — I10 Essential (primary) hypertension: Secondary | ICD-10-CM | POA: Diagnosis not present

## 2012-06-24 DIAGNOSIS — R5383 Other fatigue: Secondary | ICD-10-CM | POA: Diagnosis not present

## 2012-06-24 DIAGNOSIS — IMO0001 Reserved for inherently not codable concepts without codable children: Secondary | ICD-10-CM | POA: Diagnosis not present

## 2012-06-24 DIAGNOSIS — J45909 Unspecified asthma, uncomplicated: Secondary | ICD-10-CM | POA: Diagnosis not present

## 2012-06-24 DIAGNOSIS — M159 Polyosteoarthritis, unspecified: Secondary | ICD-10-CM | POA: Diagnosis not present

## 2012-06-24 DIAGNOSIS — Z471 Aftercare following joint replacement surgery: Secondary | ICD-10-CM | POA: Diagnosis not present

## 2012-06-30 ENCOUNTER — Encounter (INDEPENDENT_AMBULATORY_CARE_PROVIDER_SITE_OTHER): Payer: Medicare Other

## 2012-06-30 DIAGNOSIS — M79609 Pain in unspecified limb: Secondary | ICD-10-CM | POA: Diagnosis not present

## 2012-06-30 DIAGNOSIS — M6281 Muscle weakness (generalized): Secondary | ICD-10-CM | POA: Diagnosis not present

## 2012-06-30 DIAGNOSIS — R609 Edema, unspecified: Secondary | ICD-10-CM

## 2012-06-30 DIAGNOSIS — M25879 Other specified joint disorders, unspecified ankle and foot: Secondary | ICD-10-CM | POA: Diagnosis not present

## 2012-06-30 DIAGNOSIS — IMO0001 Reserved for inherently not codable concepts without codable children: Secondary | ICD-10-CM | POA: Diagnosis not present

## 2012-06-30 DIAGNOSIS — M25569 Pain in unspecified knee: Secondary | ICD-10-CM | POA: Diagnosis not present

## 2012-06-30 DIAGNOSIS — M7989 Other specified soft tissue disorders: Secondary | ICD-10-CM | POA: Diagnosis not present

## 2012-07-04 DIAGNOSIS — IMO0001 Reserved for inherently not codable concepts without codable children: Secondary | ICD-10-CM | POA: Diagnosis not present

## 2012-07-04 DIAGNOSIS — M6281 Muscle weakness (generalized): Secondary | ICD-10-CM | POA: Diagnosis not present

## 2012-07-04 DIAGNOSIS — M25569 Pain in unspecified knee: Secondary | ICD-10-CM | POA: Diagnosis not present

## 2012-07-04 DIAGNOSIS — M25879 Other specified joint disorders, unspecified ankle and foot: Secondary | ICD-10-CM | POA: Diagnosis not present

## 2012-07-06 DIAGNOSIS — M6281 Muscle weakness (generalized): Secondary | ICD-10-CM | POA: Diagnosis not present

## 2012-07-06 DIAGNOSIS — IMO0001 Reserved for inherently not codable concepts without codable children: Secondary | ICD-10-CM | POA: Diagnosis not present

## 2012-07-06 DIAGNOSIS — M25879 Other specified joint disorders, unspecified ankle and foot: Secondary | ICD-10-CM | POA: Diagnosis not present

## 2012-07-06 DIAGNOSIS — M25569 Pain in unspecified knee: Secondary | ICD-10-CM | POA: Diagnosis not present

## 2012-07-08 DIAGNOSIS — Z96659 Presence of unspecified artificial knee joint: Secondary | ICD-10-CM | POA: Diagnosis not present

## 2012-07-08 DIAGNOSIS — Z471 Aftercare following joint replacement surgery: Secondary | ICD-10-CM | POA: Diagnosis not present

## 2012-07-11 ENCOUNTER — Inpatient Hospital Stay (HOSPITAL_COMMUNITY)
Admission: EM | Admit: 2012-07-11 | Discharge: 2012-07-14 | DRG: 153 | Disposition: A | Discharge status: Home / Self Care | Payer: Medicare Other | Attending: Family Medicine | Admitting: Family Medicine

## 2012-07-11 ENCOUNTER — Emergency Department (HOSPITAL_COMMUNITY): Payer: Medicare Other

## 2012-07-11 ENCOUNTER — Encounter (HOSPITAL_COMMUNITY): Payer: Self-pay | Admitting: Emergency Medicine

## 2012-07-11 DIAGNOSIS — D649 Anemia, unspecified: Secondary | ICD-10-CM | POA: Diagnosis not present

## 2012-07-11 DIAGNOSIS — R9431 Abnormal electrocardiogram [ECG] [EKG]: Secondary | ICD-10-CM | POA: Diagnosis not present

## 2012-07-11 DIAGNOSIS — J209 Acute bronchitis, unspecified: Secondary | ICD-10-CM | POA: Diagnosis not present

## 2012-07-11 DIAGNOSIS — J441 Chronic obstructive pulmonary disease with (acute) exacerbation: Secondary | ICD-10-CM | POA: Diagnosis present

## 2012-07-11 DIAGNOSIS — E871 Hypo-osmolality and hyponatremia: Secondary | ICD-10-CM | POA: Diagnosis present

## 2012-07-11 DIAGNOSIS — K219 Gastro-esophageal reflux disease without esophagitis: Secondary | ICD-10-CM | POA: Diagnosis present

## 2012-07-11 DIAGNOSIS — J45909 Unspecified asthma, uncomplicated: Secondary | ICD-10-CM | POA: Diagnosis present

## 2012-07-11 DIAGNOSIS — R55 Syncope and collapse: Secondary | ICD-10-CM | POA: Diagnosis present

## 2012-07-11 DIAGNOSIS — Z96659 Presence of unspecified artificial knee joint: Secondary | ICD-10-CM

## 2012-07-11 DIAGNOSIS — D72819 Decreased white blood cell count, unspecified: Secondary | ICD-10-CM | POA: Diagnosis present

## 2012-07-11 DIAGNOSIS — F329 Major depressive disorder, single episode, unspecified: Secondary | ICD-10-CM | POA: Diagnosis present

## 2012-07-11 DIAGNOSIS — J45901 Unspecified asthma with (acute) exacerbation: Secondary | ICD-10-CM | POA: Diagnosis present

## 2012-07-11 DIAGNOSIS — E039 Hypothyroidism, unspecified: Secondary | ICD-10-CM | POA: Diagnosis not present

## 2012-07-11 DIAGNOSIS — Z888 Allergy status to other drugs, medicaments and biological substances status: Secondary | ICD-10-CM

## 2012-07-11 DIAGNOSIS — IMO0001 Reserved for inherently not codable concepts without codable children: Secondary | ICD-10-CM | POA: Diagnosis present

## 2012-07-11 DIAGNOSIS — J111 Influenza due to unidentified influenza virus with other respiratory manifestations: Principal | ICD-10-CM | POA: Diagnosis present

## 2012-07-11 DIAGNOSIS — J438 Other emphysema: Secondary | ICD-10-CM | POA: Diagnosis not present

## 2012-07-11 DIAGNOSIS — R0602 Shortness of breath: Secondary | ICD-10-CM | POA: Diagnosis not present

## 2012-07-11 DIAGNOSIS — Z79899 Other long term (current) drug therapy: Secondary | ICD-10-CM | POA: Diagnosis not present

## 2012-07-11 DIAGNOSIS — J42 Unspecified chronic bronchitis: Secondary | ICD-10-CM | POA: Diagnosis not present

## 2012-07-11 DIAGNOSIS — R059 Cough, unspecified: Secondary | ICD-10-CM | POA: Diagnosis present

## 2012-07-11 DIAGNOSIS — I10 Essential (primary) hypertension: Secondary | ICD-10-CM | POA: Diagnosis not present

## 2012-07-11 DIAGNOSIS — R05 Cough: Secondary | ICD-10-CM | POA: Diagnosis not present

## 2012-07-11 DIAGNOSIS — M129 Arthropathy, unspecified: Secondary | ICD-10-CM | POA: Diagnosis present

## 2012-07-11 DIAGNOSIS — Z720 Tobacco use: Secondary | ICD-10-CM | POA: Diagnosis present

## 2012-07-11 DIAGNOSIS — F3289 Other specified depressive episodes: Secondary | ICD-10-CM | POA: Diagnosis present

## 2012-07-11 HISTORY — DX: Unspecified asthma, uncomplicated: J45.909

## 2012-07-11 LAB — CBC WITH DIFFERENTIAL/PLATELET
Basophils Relative: 0 % (ref 0–1)
Eosinophils Absolute: 0 10*3/uL (ref 0.0–0.7)
Hemoglobin: 11 g/dL — ABNORMAL LOW (ref 12.0–15.0)
MCH: 32 pg (ref 26.0–34.0)
MCHC: 35.9 g/dL (ref 30.0–36.0)
Monocytes Relative: 18 % — ABNORMAL HIGH (ref 3–12)
Neutrophils Relative %: 64 % (ref 43–77)

## 2012-07-11 LAB — COMPREHENSIVE METABOLIC PANEL
Albumin: 3.6 g/dL (ref 3.5–5.2)
Alkaline Phosphatase: 75 U/L (ref 39–117)
BUN: 13 mg/dL (ref 6–23)
Creatinine, Ser: 0.79 mg/dL (ref 0.50–1.10)
GFR calc Af Amer: >90 mL/min (ref >90)
GFR calc non Af Amer: 85 mL/min — ABNORMAL LOW (ref >90)
Potassium: 4.3 mEq/L (ref 3.5–5.1)
Total Protein: 6.5 g/dL (ref 6.0–8.3)

## 2012-07-11 LAB — URINALYSIS, ROUTINE W REFLEX MICROSCOPIC
Bilirubin Urine: NEGATIVE
Hgb urine dipstick: NEGATIVE
Ketones, ur: NEGATIVE mg/dL
Nitrite: NEGATIVE
Protein, ur: NEGATIVE mg/dL
Urobilinogen, UA: 0.2 mg/dL (ref 0.0–1.0)

## 2012-07-11 LAB — POCT I-STAT TROPONIN I: Troponin i, poc: 0.01 ng/mL (ref 0.00–0.08)

## 2012-07-11 MED ORDER — OXYCODONE-ACETAMINOPHEN 5-325 MG PO TABS
1.0000 | ORAL_TABLET | Freq: Once | ORAL | Status: AC
  Administered 2012-07-11: 1 via ORAL
  Filled 2012-07-11: qty 1

## 2012-07-11 MED ORDER — BIOTENE DRY MOUTH MT LIQD
15.0000 mL | Freq: Two times a day (BID) | OROMUCOSAL | Status: DC
  Administered 2012-07-11 – 2012-07-14 (×6): 15 mL via OROMUCOSAL

## 2012-07-11 MED ORDER — ALBUTEROL (5 MG/ML) CONTINUOUS INHALATION SOLN
10.0000 mg/h | INHALATION_SOLUTION | Freq: Once | RESPIRATORY_TRACT | Status: AC
  Administered 2012-07-11: 10 mg/h via RESPIRATORY_TRACT
  Filled 2012-07-11: qty 20

## 2012-07-11 MED ORDER — DEXTROSE 5 % IV SOLN
500.0000 mg | INTRAVENOUS | Status: DC
  Administered 2012-07-11: 500 mg via INTRAVENOUS
  Filled 2012-07-11 (×2): qty 500

## 2012-07-11 MED ORDER — SODIUM CHLORIDE 0.9 % IV SOLN
INTRAVENOUS | Status: AC
  Administered 2012-07-11: via INTRAVENOUS

## 2012-07-11 MED ORDER — ACETAMINOPHEN 650 MG RE SUPP
650.0000 mg | Freq: Four times a day (QID) | RECTAL | Status: DC | PRN
  Filled 2012-07-11: qty 1

## 2012-07-11 MED ORDER — BENZONATATE 100 MG PO CAPS
100.0000 mg | ORAL_CAPSULE | Freq: Three times a day (TID) | ORAL | Status: DC | PRN
  Administered 2012-07-12 – 2012-07-13 (×3): 100 mg via ORAL
  Filled 2012-07-11 (×4): qty 1

## 2012-07-11 MED ORDER — LEVOTHYROXINE SODIUM 88 MCG PO TABS
88.0000 ug | ORAL_TABLET | Freq: Every day | ORAL | Status: DC
  Administered 2012-07-12 – 2012-07-14 (×3): 88 ug via ORAL
  Filled 2012-07-11 (×4): qty 1

## 2012-07-11 MED ORDER — CITALOPRAM HYDROBROMIDE 20 MG PO TABS
20.0000 mg | ORAL_TABLET | Freq: Every day | ORAL | Status: DC
  Administered 2012-07-12 – 2012-07-14 (×3): 20 mg via ORAL
  Filled 2012-07-11 (×3): qty 1

## 2012-07-11 MED ORDER — ONDANSETRON HCL 4 MG PO TABS: 4.0000 mg | ORAL_TABLET | Freq: Four times a day (QID) | ORAL | Status: DC | PRN

## 2012-07-11 MED ORDER — ENOXAPARIN SODIUM 40 MG/0.4ML ~~LOC~~ SOLN: 40.0000 mg | SUBCUTANEOUS | Status: DC

## 2012-07-11 MED ORDER — ONDANSETRON HCL 4 MG/2ML IJ SOLN: 4.0000 mg | Freq: Four times a day (QID) | INTRAMUSCULAR | Status: DC | PRN

## 2012-07-11 MED ORDER — DEXTROSE 5 % IV SOLN
1.0000 g | INTRAVENOUS | Status: DC
  Administered 2012-07-11: 1 g via INTRAVENOUS
  Filled 2012-07-11 (×2): qty 10

## 2012-07-11 MED ORDER — ALPRAZOLAM 0.5 MG PO TABS
0.5000 mg | ORAL_TABLET | Freq: Every evening | ORAL | Status: DC | PRN
  Administered 2012-07-11: 0.5 mg via ORAL
  Filled 2012-07-11: qty 1

## 2012-07-11 MED ORDER — ALBUTEROL SULFATE (5 MG/ML) 0.5% IN NEBU
2.5000 mg | INHALATION_SOLUTION | RESPIRATORY_TRACT | Status: DC | PRN
  Administered 2012-07-11: 2.5 mg via RESPIRATORY_TRACT
  Filled 2012-07-11: qty 0.5

## 2012-07-11 MED ORDER — ENOXAPARIN SODIUM 30 MG/0.3ML ~~LOC~~ SOLN
30.0000 mg | Freq: Two times a day (BID) | SUBCUTANEOUS | Status: DC
  Administered 2012-07-11 – 2012-07-14 (×6): 30 mg via SUBCUTANEOUS
  Filled 2012-07-11 (×7): qty 0.3

## 2012-07-11 MED ORDER — AMLODIPINE BESYLATE 5 MG PO TABS
5.0000 mg | ORAL_TABLET | Freq: Every day | ORAL | Status: DC
  Administered 2012-07-12 – 2012-07-14 (×3): 5 mg via ORAL
  Filled 2012-07-11 (×3): qty 1

## 2012-07-11 MED ORDER — METHOCARBAMOL 500 MG PO TABS
500.0000 mg | ORAL_TABLET | Freq: Four times a day (QID) | ORAL | Status: DC
  Administered 2012-07-11 – 2012-07-14 (×11): 500 mg via ORAL
  Filled 2012-07-11 (×13): qty 1

## 2012-07-11 MED ORDER — SODIUM CHLORIDE 0.9 % IJ SOLN
3.0000 mL | Freq: Two times a day (BID) | INTRAMUSCULAR | Status: DC
  Administered 2012-07-12 – 2012-07-14 (×5): 3 mL via INTRAVENOUS

## 2012-07-11 MED ORDER — ACETAMINOPHEN 325 MG PO TABS
650.0000 mg | ORAL_TABLET | Freq: Four times a day (QID) | ORAL | Status: DC | PRN
  Administered 2012-07-12: 650 mg via ORAL
  Filled 2012-07-11: qty 2

## 2012-07-11 MED ORDER — OXYCODONE-ACETAMINOPHEN 5-325 MG PO TABS
1.0000 | ORAL_TABLET | ORAL | Status: DC | PRN
  Administered 2012-07-12: 2 via ORAL
  Administered 2012-07-12: 1 via ORAL
  Administered 2012-07-12 – 2012-07-14 (×10): 2 via ORAL
  Filled 2012-07-11 (×13): qty 2

## 2012-07-11 MED ORDER — ASPIRIN EC 81 MG PO TBEC
81.0000 mg | DELAYED_RELEASE_TABLET | Freq: Every day | ORAL | Status: DC
  Administered 2012-07-12 – 2012-07-14 (×3): 81 mg via ORAL
  Filled 2012-07-11 (×3): qty 1

## 2012-07-11 MED ORDER — LOSARTAN POTASSIUM 50 MG PO TABS
100.0000 mg | ORAL_TABLET | Freq: Every day | ORAL | Status: DC
  Administered 2012-07-12 – 2012-07-14 (×3): 100 mg via ORAL
  Filled 2012-07-11 (×3): qty 2

## 2012-07-11 MED ORDER — IOHEXOL 350 MG/ML SOLN
100.0000 mL | Freq: Once | INTRAVENOUS | Status: AC | PRN
  Administered 2012-07-11: 100 mL via INTRAVENOUS

## 2012-07-11 MED ORDER — ALBUTEROL SULFATE (5 MG/ML) 0.5% IN NEBU
2.5000 mg | INHALATION_SOLUTION | Freq: Four times a day (QID) | RESPIRATORY_TRACT | Status: DC
  Administered 2012-07-12 – 2012-07-14 (×11): 2.5 mg via RESPIRATORY_TRACT
  Filled 2012-07-11 (×10): qty 0.5

## 2012-07-11 MED ORDER — IPRATROPIUM BROMIDE 0.02 % IN SOLN
0.5000 mg | Freq: Four times a day (QID) | RESPIRATORY_TRACT | Status: DC
  Administered 2012-07-12 – 2012-07-14 (×11): 0.5 mg via RESPIRATORY_TRACT
  Filled 2012-07-11 (×11): qty 2.5

## 2012-07-11 NOTE — ED Provider Notes (Signed)
History     CSN: [id number]  Arrival date & time 07/11/12  1530   First MD Initiated Contact with Patient 07/11/12 1539      Chief Complaint  Patient presents with  . Shortness of Breath  . Knee Pain    (Consider location/radiation/quality/duration/timing/severity/associated sxs/prior treatment) Patient is a 67 y.o. female presenting with shortness of breath. The history is provided by the patient and medical records. No language interpreter was used.  Shortness of Breath Severity:  Moderate (Patient is a 3 day history of shortness of breath, cough, aching  all over.) Onset quality:  Gradual Duration:  3 days Timing:  Intermittent Progression:  Worsening Chronicity:  Recurrent (She has known COPD.) Relieved by:  Nothing Worsened by:  Nothing tried Ineffective treatments:  Inhaler Associated symptoms: cough   Associated symptoms: no chest pain and no fever   Associated symptoms comment:  She reportedly fainted twice at home today, and was incontinent of urine. Risk factors: recent surgery     Past Medical History  Diagnosis Date  . Hypertension   . Thyroid disease   . Hypothyroidism   . Depression   . GERD (gastroesophageal reflux disease)   . Arthritis   . Fibromyalgia     Past Surgical History  Procedure Laterality Date  . Back surgery    . Abdominal hysterectomy    . Cholecystectomy    . Tubal ligation    . Knee arthroscopy Left   . Carpal tunnel release Right   . Total knee arthroplasty Left 06/01/2012    Procedure: LEFT TOTAL KNEE ARTHROPLASTY;  Surgeon: Loreta Ave, MD;  Location: [facility] OR;  Service: Orthopedics;  Laterality: Left;    History reviewed. No pertinent family history.  History  Substance Use Topics  . Smoking status: Current Every Day Smoker -- 1.00 packs/day for 40 years    Types: Cigarettes  . Smokeless tobacco: Never Used  . Alcohol Use: No    OB History   Grav Para Term Preterm Abortions TAB SAB Ect Mult Living                   Review of Systems  Constitutional: Negative.  Negative for fever and chills.  HENT: Negative.   Eyes: Negative.   Respiratory: Positive for cough and shortness of breath.   Cardiovascular: Negative for chest pain and palpitations.  Gastrointestinal: Negative.   Genitourinary: Negative.   Musculoskeletal:       Recent left total knee replacement surgery  Skin: Negative.   Neurological: Negative.   Psychiatric/Behavioral: Negative.     Allergies  Prednisone and Tramadol  Home Medications   Current Outpatient Rx  Name  Route  Sig  Dispense  Refill  . ALPRAZolam (XANAX) 0.5 MG tablet   Oral   Take 0.5 mg by mouth at bedtime as needed for sleep.         Marland Kitchen amLODipine (NORVASC) 10 MG tablet   Oral   Take 5 mg by mouth daily.          Marland Kitchen aspirin EC 81 MG tablet   Oral   Take 81 mg by mouth daily.         . benzonatate (TESSALON) 100 MG capsule   Oral   Take 100 mg by mouth 3 (three) times daily as needed for cough.         Marland Kitchen CALCIUM PO   Oral   Take 1 tablet by mouth daily.         Marland Kitchen  citalopram (CELEXA) 20 MG tablet   Oral   Take 20 mg by mouth daily.         Marland Kitchen doxycycline (ADOXA) 100 MG tablet   Oral   Take 100 mg by mouth 2 (two) times daily.         Marland Kitchen levothyroxine (SYNTHROID, LEVOTHROID) 88 MCG tablet   Oral   Take 88 mcg by mouth daily before breakfast.         . losartan (COZAAR) 100 MG tablet   Oral   Take 100 mg by mouth daily.         . methocarbamol (ROBAXIN) 500 MG tablet   Oral   Take 500 mg by mouth 4 (four) times daily.         Marland Kitchen oxyCODONE-acetaminophen (PERCOCET/ROXICET) 5-325 MG per tablet   Oral   Take 1-2 tablets by mouth every 4 (four) hours as needed for pain.   60 tablet   0   . EXPIRED: enoxaparin (LOVENOX) 30 MG/0.3ML injection   Subcutaneous   Inject 0.3 mLs (30 mg total) into the skin every 12 (twelve) hours.   0.3 mL   1     STOP WHEN COUMADIN IS THERAPEUTIC WITH INR 2-3.     BP 125/101  Pulse 77   Temp(Src) 99.5 F (37.5 C) (Oral)  Resp 23  SpO2 99%  Physical Exam  Nursing note and vitals reviewed. Constitutional: She is oriented to person, place, and time.  Pleasant elderly lady, with tachypnea of 26.  HENT:  Head: Normocephalic and atraumatic.  Right Ear: External ear normal.  Left Ear: External ear normal.  Mouth/Throat: Oropharynx is clear and moist.  Eyes: Conjunctivae and EOM are normal. Pupils are equal, round, and reactive to light. No scleral icterus.  Neck: Normal range of motion. Neck supple.  Cardiovascular: Normal rate, regular rhythm and normal heart sounds.   Pulmonary/Chest: Effort normal and breath sounds normal.  Abdominal: Soft. Bowel sounds are normal.  Musculoskeletal: Normal range of motion. She exhibits no edema and no tenderness.  Well-healed L TKR scar.  Neurological: She is alert and oriented to person, place, and time.  No sensory or motor deficit.  Skin: Skin is warm and dry.  Psychiatric: She has a normal mood and affect. Her behavior is normal.    ED Course  Procedures (including critical care time)      Date: 07/11/2012  Rate: 72  Rhythm: normal sinus rhythm  QRS Axis: normal  Intervals: normal  ST/T Wave abnormalities: nonspecific T wave changes  Conduction Disutrbances:none  Narrative Interpretation: Borderline EKG  Old EKG Reviewed: unchanged     6:38 PM D-dimer is elevated at 1.41.  CT angio of chest ordered to check for pulmonary embolism.   8:38 PM Results for orders placed during the hospital encounter of 07/11/12  CBC WITH DIFFERENTIAL      Result Value Range   WBC 4.6  4.0 - 10.5 K/uL   RBC 3.44 (*) 3.87 - 5.11 MIL/uL   Hemoglobin 11.0 (*) 12.0 - 15.0 g/dL   HCT 16.1 (*) 09.6 - 04.5 %   MCV 89.0  78.0 - 100.0 fL   MCH 32.0  26.0 - 34.0 pg   MCHC 35.9  30.0 - 36.0 g/dL   RDW 40.9  81.1 - 91.4 %   Platelets 196  150 - 400 K/uL   Neutrophils Relative 64  43 - 77 %   Neutro Abs 3.0  1.7 - 7.7 K/uL  Lymphocytes Relative 17  12 - 46 %   Lymphs Abs 0.8  0.7 - 4.0 K/uL   Monocytes Relative 18 (*) 3 - 12 %   Monocytes Absolute 0.8  0.1 - 1.0 K/uL   Eosinophils Relative 0  0 - 5 %   Eosinophils Absolute 0.0  0.0 - 0.7 K/uL   Basophils Relative 0  0 - 1 %   Basophils Absolute 0.0  0.0 - 0.1 K/uL  COMPREHENSIVE METABOLIC PANEL      Result Value Range   Sodium 126 (*) 135 - 145 mEq/L   Potassium 4.3  3.5 - 5.1 mEq/L   Chloride 91 (*) 96 - 112 mEq/L   CO2 28  19 - 32 mEq/L   Glucose, Bld 85  70 - 99 mg/dL   BUN 13  6 - 23 mg/dL   Creatinine, Ser 5.62  0.50 - 1.10 mg/dL   Calcium 8.9  8.4 - 13.0 mg/dL   Total Protein 6.5  6.0 - 8.3 g/dL   Albumin 3.6  3.5 - 5.2 g/dL   AST 40 (*) 0 - 37 U/L   ALT 16  0 - 35 U/L   Alkaline Phosphatase 75  39 - 117 U/L   Total Bilirubin 0.2 (*) 0.3 - 1.2 mg/dL   GFR calc non Af Amer 85 (*) >90 mL/min   GFR calc Af Amer >90  >90 mL/min  URINALYSIS, ROUTINE W REFLEX MICROSCOPIC      Result Value Range   Color, Urine YELLOW  YELLOW   APPearance CLEAR  CLEAR   Specific Gravity, Urine 1.015  1.005 - 1.030   pH 6.5  5.0 - 8.0   Glucose, UA NEGATIVE  NEGATIVE mg/dL   Hgb urine dipstick NEGATIVE  NEGATIVE   Bilirubin Urine NEGATIVE  NEGATIVE   Ketones, ur NEGATIVE  NEGATIVE mg/dL   Protein, ur NEGATIVE  NEGATIVE mg/dL   Urobilinogen, UA 0.2  0.0 - 1.0 mg/dL   Nitrite NEGATIVE  NEGATIVE   Leukocytes, UA NEGATIVE  NEGATIVE  APTT      Result Value Range   aPTT 35  24 - 37 seconds  PROTIME-INR      Result Value Range   Prothrombin Time 14.0  11.6 - 15.2 seconds   INR 1.09  0.00 - 1.49  D-DIMER, QUANTITATIVE      Result Value Range   D-Dimer, Quant 1.40 (*) 0.00 - 0.48 ug/mL-FEU  POCT I-STAT TROPONIN I      Result Value Range   Troponin i, poc 0.01  0.00 - 0.08 ng/mL   Comment 3            Dg Chest 2 View  07/11/2012  *RADIOLOGY REPORT*  Clinical Data: Cough and shortness of breath.  CHEST - 2 VIEW  Comparison: 05/09/2012.  Findings: The cardiac  silhouette, mediastinal and hilar contours are within normal limits and stable.  There are chronic bronchitic type lung changes but no infiltrates, edema or effusions.  The bony thorax is intact.  IMPRESSION: Chronic bronchitic type lung changes but no acute pulmonary findings.   Original Report Authenticated By: Rudie Meyer, M.D.    Ct Angio Chest W/cm &/or Wo Cm  07/11/2012  *RADIOLOGY REPORT*  Clinical Data: Short of breath.  Knee pain.  Recent knee surgery. Pulmonary embolism.  CT ANGIOGRAPHY CHEST  Technique:  Multidetector CT imaging of the chest using the standard protocol during bolus administration of intravenous contrast. Multiplanar reconstructed images including MIPs were obtained and reviewed  to evaluate the vascular anatomy.  Contrast: OMNIPAQUE IOHEXOL 350 MG/ML SOLN  Comparison: Radiographs 07/11/2012.  Findings: Technically adequate study.  Negative for pulmonary embolism.  No acute aortic abnormality.  Aortic and branch vessel atherosclerosis.  There is no axillary adenopathy.  No mediastinal or hilar adenopathy. Coronary artery atherosclerosis is present. If office based assessment of coronary risk factors has not been performed, it is now recommended.  Small hiatal hernia with patulous gastroesophageal junction.  Incidental imaging of the upper abdomen is within normal limits.  The central airways are patent.  There is no airspace disease.  Centrilobular emphysema is present.  Paraseptal emphysema at the apices.  No pneumothorax. Gas filled cystic lesion is present dorsal to the trachea, most compatible with tracheal diverticulum.  Esophageal diverticulum considered less likely.  No pleural or pericardial effusion. Thoracic spondylosis.  No aggressive osseous lesions.  Lower cervical ACDF.  IMPRESSION:  1.  Negative for pulmonary embolism.  No acute aortic abnormality. 2.  Atherosclerosis and coronary artery disease. 3.  Emphysema.   Original Report Authenticated By: Andreas Newport,  M.D.    Course in ED: CT angio of chest showed no PE, did show COPD.  She has had minimal improvement for continuous neb Rx.  Says she is allergic to prednisone (hands and feet turned red last Friday).  Will request unassigned medicine to admit her for COPD exacerbation.  1. COPD exacerbation        Carleene Cooper III, MD 07/12/12 1048

## 2012-07-11 NOTE — ED Notes (Signed)
Azithromycin being sent up with pt

## 2012-07-11 NOTE — ED Notes (Signed)
Per EMS: pt called out for breathing difficulties; pt c/o pain and warmth in knee replacement of left knee; pt states she passed out around 11am this morning and was incontinent; pt has hx of COPD; BP 116/65 temp 100.2 97% on 3L [place] CBG 96; wheezing in upper lobes; went to dr Robynn Pane this am for antibiotic.

## 2012-07-11 NOTE — Progress Notes (Signed)
Pt placed on 10mg  CAT per MD order.  PT tolerating well at this time.  RT will continue to monitor.

## 2012-07-11 NOTE — ED Notes (Addendum)
Report called to floor and given to [facility], Charity fundraiser. Nurse has no further questions upon report given. Belongings being sent with pt. Valuables, including wallet and medications, sent with family. Preparing pt for transport to floor.

## 2012-07-11 NOTE — ED Notes (Signed)
Phlebotomy at bedside.

## 2012-07-11 NOTE — ED Notes (Signed)
Called respiratory for continuous neb to be administered

## 2012-07-11 NOTE — ED Notes (Signed)
Dr. Davidson at bedside.

## 2012-07-11 NOTE — H&P (Signed)
[facility] History and Physical  Alexis Ortega ZOX:[id number] DOB: October 09, 1945 DOA: 07/11/2012  Referring physician: Dr. Ignacia Palma. PCP: Pcp Not In System   Chief Complaint: Shortness of breath and cough.  HPI: Alexis Ortega is a 67 y.o. female who has had a recent knee surgery last month, with history of hypertension and hypothyroidism was brought to the ER patient has been having persistent cough and shortness of breath. Patient also had 2 spells of loss of consciousness today at home. Patient has been having cough for last 3-4 days which has per the patient and family has been severe. Along with the cough patient also has been having some mild shortness of breath. Patient was placed on doxycycline by patient's PCP despite taking which patient was still having cough. Per family patient if you have 100F at home. Patient 2-3 days ago tried prednisone and felt like burning sensation in extremities and discontinued. CT angiogram of the chest was done which was negative for anything acute. EKG was showing sinus rhythm. Cardiac enzymes pain negative. Patient states that both times she had syncopal episode when she was coughing. Denies any focal deficits or any orthostatic symptoms. Patient will be admitted for further management. Patient denies any nausea vomiting abdominal pain. Patient was recently placed on doxycycline prior to recent dose by the patient's orthopedic surgeon after patient developed a small erythema around the surgical site and was on doxycycline for 20 days. ER physician Dr. Ignacia Palma on initial exam found patient to be wheezing and was placed on nebulizers. Since patient has history of allergy to prednisone steroid was not added.  Review of Systems: As presented in the history of presenting illness, rest negative.  Past Medical History  Diagnosis Date  . Hypertension   . Thyroid disease   . Hypothyroidism   . Depression   . GERD (gastroesophageal reflux disease)   .  Arthritis   . Fibromyalgia   . Asthma    Past Surgical History  Procedure Laterality Date  . Back surgery    . Abdominal hysterectomy    . Cholecystectomy    . Tubal ligation    . Knee arthroscopy Left   . Carpal tunnel release Right   . Total knee arthroplasty Left 06/01/2012    Procedure: LEFT TOTAL KNEE ARTHROPLASTY;  Surgeon: Loreta Ave, MD;  Location: [facility] OR;  Service: Orthopedics;  Laterality: Left;   Social History:  reports that she has been smoking Cigarettes.  She has a 40 pack-year smoking history. She has never used smokeless tobacco. She reports that she does not drink alcohol or use illicit drugs. Lives at home. where does patient live-- Can do ADLs. Can patient participate in ADLs?  Allergies  Allergen Reactions  . Prednisone Other (See Comments)    Feet and arms itch, sting and turn red.  . Tramadol Itching    History reviewed. No pertinent family history.    Prior to Admission medications   Medication Sig Start Date End Date Taking? Authorizing Provider  ALPRAZolam Prudy Feeler) 0.5 MG tablet Take 0.5 mg by mouth at bedtime as needed for sleep.   Yes Historical Provider, MD  amLODipine (NORVASC) 10 MG tablet Take 5 mg by mouth daily.    Yes Historical Provider, MD  aspirin EC 81 MG tablet Take 81 mg by mouth daily.   Yes Historical Provider, MD  benzonatate (TESSALON) 100 MG capsule Take 100 mg by mouth 3 (three) times daily as needed for cough.   Yes Historical Provider,  MD  CALCIUM PO Take 1 tablet by mouth daily.   Yes Historical Provider, MD  citalopram (CELEXA) 20 MG tablet Take 20 mg by mouth daily.   Yes Historical Provider, MD  doxycycline (ADOXA) 100 MG tablet Take 100 mg by mouth 2 (two) times daily.   Yes Historical Provider, MD  levothyroxine (SYNTHROID, LEVOTHROID) 88 MCG tablet Take 88 mcg by mouth daily before breakfast.   Yes Historical Provider, MD  losartan (COZAAR) 100 MG tablet Take 100 mg by mouth daily.   Yes Historical Provider, MD   methocarbamol (ROBAXIN) 500 MG tablet Take 500 mg by mouth 4 (four) times daily.   Yes Historical Provider, MD  oxyCODONE-acetaminophen (PERCOCET/ROXICET) 5-325 MG per tablet Take 1-2 tablets by mouth every 4 (four) hours as needed for pain. 06/03/12  Yes Naida Sleight, PA-C  enoxaparin (LOVENOX) 30 MG/0.3ML injection Inject 0.3 mLs (30 mg total) into the skin every 12 (twelve) hours. 06/03/12 06/06/12  Naida Sleight, PA-C   Physical Exam: Filed Vitals:   07/11/12 2127 07/11/12 2129 07/11/12 2131 07/11/12 2200  BP: 136/55 125/101 105/75 134/62  Pulse: 81 79 85 74  Temp:    99 F (37.2 C)  TempSrc:    Oral  Resp:    22  SpO2:    97%     General:  Well-developed and nourished.  Eyes: Anicteric no pallor.  ENT: No discharge from the ears eyes nose or mouth.  Neck: No mass felt.  Cardiovascular: S1-S2 heard.  Respiratory: No rhonchi or crepitations.  Abdomen: Soft nontender bowel sounds present.  Skin: No rash. Mild erythema around the surgical site which patient states has been there for sometime.  Musculoskeletal: No edema.  Psychiatric: Appears normal.  Neurologic: Alert awake oriented to time place and person. Moves all extremities.  Labs on Admission:  Basic Metabolic Panel:  Recent Labs Lab 07/11/12 1553  NA 126*  K 4.3  CL 91*  CO2 28  GLUCOSE 85  BUN 13  CREATININE 0.79  CALCIUM 8.9   Liver Function Tests:  Recent Labs Lab 07/11/12 1553  AST 40*  ALT 16  ALKPHOS 75  BILITOT 0.2*  PROT 6.5  ALBUMIN 3.6   No results found for this basename: LIPASE, AMYLASE,  in the last 168 hours No results found for this basename: AMMONIA,  in the last 168 hours CBC:  Recent Labs Lab 07/11/12 1553  WBC 4.6  NEUTROABS 3.0  HGB 11.0*  HCT 30.6*  MCV 89.0  PLT 196   Cardiac Enzymes: No results found for this basename: CKTOTAL, CKMB, CKMBINDEX, TROPONINI,  in the last 168 hours  BNP (last 3 results) No results found for this basename: PROBNP,  in the  last 8760 hours CBG: No results found for this basename: GLUCAP,  in the last 168 hours  Radiological Exams on Admission: Dg Chest 2 View  07/11/2012  *RADIOLOGY REPORT*  Clinical Data: Cough and shortness of breath.  CHEST - 2 VIEW  Comparison: 05/09/2012.  Findings: The cardiac silhouette, mediastinal and hilar contours are within normal limits and stable.  There are chronic bronchitic type lung changes but no infiltrates, edema or effusions.  The bony thorax is intact.  IMPRESSION: Chronic bronchitic type lung changes but no acute pulmonary findings.   Original Report Authenticated By: Rudie Meyer, M.D.    Ct Angio Chest W/cm &/or Wo Cm  07/11/2012  *RADIOLOGY REPORT*  Clinical Data: Short of breath.  Knee pain.  Recent knee surgery. Pulmonary embolism.  CT ANGIOGRAPHY CHEST  Technique:  Multidetector CT imaging of the chest using the standard protocol during bolus administration of intravenous contrast. Multiplanar reconstructed images including MIPs were obtained and reviewed to evaluate the vascular anatomy.  Contrast: OMNIPAQUE IOHEXOL 350 MG/ML SOLN  Comparison: Radiographs 07/11/2012.  Findings: Technically adequate study.  Negative for pulmonary embolism.  No acute aortic abnormality.  Aortic and branch vessel atherosclerosis.  There is no axillary adenopathy.  No mediastinal or hilar adenopathy. Coronary artery atherosclerosis is present. If office based assessment of coronary risk factors has not been performed, it is now recommended.  Small hiatal hernia with patulous gastroesophageal junction.  Incidental imaging of the upper abdomen is within normal limits.  The central airways are patent.  There is no airspace disease.  Centrilobular emphysema is present.  Paraseptal emphysema at the apices.  No pneumothorax. Gas filled cystic lesion is present dorsal to the trachea, most compatible with tracheal diverticulum.  Esophageal diverticulum considered less likely.  No pleural or pericardial  effusion. Thoracic spondylosis.  No aggressive osseous lesions.  Lower cervical ACDF.  IMPRESSION:  1.  Negative for pulmonary embolism.  No acute aortic abnormality. 2.  Atherosclerosis and coronary artery disease. 3.  Emphysema.   Original Report Authenticated By: Andreas Newport, M.D.     Assessment/Plan Principal Problem:   SOB (shortness of breath) Active Problems:   Cough   Tobacco abuse   HTN (hypertension)   Hypothyroidism   Syncope   1. Shortness of breath with cough most likely secondary to bronchitis - patient at this time has been placed on antibiotics. Patient's family is concerned about influenza for which PCR has been ordered. Gently hydrate. Continue nebulizers. Patient states she is allergic to steroids and patient has not been placed on steroids. 2. Syncope - patient's syncopal episodes happened during cough spells. Syncope most likely related to cough. Closely monitor in telemetry for any arrhythmias. Check 2-D echo. Check orthostatics. 3. Hyponatremia - given the low chloride levels patient most likely slightly dehydrated. Gently hydrate. Recheck metabolic panel after hydration. Orthostatics. Check TSH. 4. Tobacco abuse - strongly advised to quit smoking. 5. Anemia - closely follow CBC. 6. Hypertension - continue home medications.    Code Status: Full code.  Family Communication: Patient's husband at the bedside.  Disposition Plan: Admit to inpatient.    Ailee Pates N. [facility] Pager [phone].  If 7PM-7AM, please contact night-coverage [email or web address] Password [facility] 07/11/2012, 10:22 PM

## 2012-07-11 NOTE — ED Notes (Signed)
CT notified that pt has IV in place and ready for transport to CT

## 2012-07-12 DIAGNOSIS — D649 Anemia, unspecified: Secondary | ICD-10-CM

## 2012-07-12 DIAGNOSIS — I10 Essential (primary) hypertension: Secondary | ICD-10-CM | POA: Diagnosis not present

## 2012-07-12 DIAGNOSIS — J441 Chronic obstructive pulmonary disease with (acute) exacerbation: Secondary | ICD-10-CM

## 2012-07-12 DIAGNOSIS — R55 Syncope and collapse: Secondary | ICD-10-CM | POA: Diagnosis not present

## 2012-07-12 DIAGNOSIS — R05 Cough: Secondary | ICD-10-CM | POA: Diagnosis not present

## 2012-07-12 LAB — INFLUENZA PANEL BY PCR (TYPE A & B): Influenza A By PCR: NEGATIVE

## 2012-07-12 LAB — CBC WITH DIFFERENTIAL/PLATELET
Basophils Absolute: 0 10*3/uL (ref 0.0–0.1)
Basophils Relative: 1 % (ref 0–1)
Eosinophils Relative: 0 % (ref 0–5)
HCT: 30.2 % — ABNORMAL LOW (ref 36.0–46.0)
Lymphocytes Relative: 21 % (ref 12–46)
MCHC: 34.8 g/dL (ref 30.0–36.0)
MCV: 89.6 fL (ref 78.0–100.0)
Monocytes Absolute: 0.7 10*3/uL (ref 0.1–1.0)
Platelets: 186 10*3/uL (ref 150–400)
RDW: 13.1 % (ref 11.5–15.5)
WBC: 2.8 10*3/uL — ABNORMAL LOW (ref 4.0–10.5)

## 2012-07-12 LAB — COMPREHENSIVE METABOLIC PANEL
ALT: 19 U/L (ref 0–35)
Albumin: 3.2 g/dL — ABNORMAL LOW (ref 3.5–5.2)
Alkaline Phosphatase: 68 U/L (ref 39–117)
BUN: 9 mg/dL (ref 6–23)
Chloride: 92 mEq/L — ABNORMAL LOW (ref 96–112)
GFR calc Af Amer: >90 mL/min (ref >90)
Glucose, Bld: 93 mg/dL (ref 70–99)
Potassium: 3.7 mEq/L (ref 3.5–5.1)
Sodium: 127 mEq/L — ABNORMAL LOW (ref 135–145)
Total Bilirubin: 0.2 mg/dL — ABNORMAL LOW (ref 0.3–1.2)

## 2012-07-12 LAB — MRSA PCR SCREENING: MRSA by PCR: NEGATIVE

## 2012-07-12 MED ORDER — OSELTAMIVIR PHOSPHATE 75 MG PO CAPS
75.0000 mg | ORAL_CAPSULE | Freq: Two times a day (BID) | ORAL | Status: DC
  Administered 2012-07-12 – 2012-07-14 (×5): 75 mg via ORAL
  Filled 2012-07-12 (×6): qty 1

## 2012-07-12 MED ORDER — ALPRAZOLAM 0.5 MG PO TABS
1.0000 mg | ORAL_TABLET | Freq: Every evening | ORAL | Status: DC | PRN
  Administered 2012-07-12 – 2012-07-13 (×2): 1 mg via ORAL
  Filled 2012-07-12 (×2): qty 2

## 2012-07-12 NOTE — Progress Notes (Signed)
Clinical Social Work Department BRIEF PSYCHOSOCIAL ASSESSMENT 07/12/2012  Patient:  Alexis Ortega, Alexis Ortega     Account Number:  [id number]     Admit date:  07/11/2012  Clinical Social Worker:  Leron Croak, CLINICAL SOCIAL WORKER  Date/Time:  07/12/2012 02:47 PM  Referred by:  Physician  Date Referred:  07/12/2012 Referred for  SNF Placement   Other Referral:   Interview type:  Patient Other interview type:    PSYCHOSOCIAL DATA Living Status:  HUSBAND Admitted from facility:   Level of care:   Primary support name:  Jayline Kilburg [phone] Primary support relationship to patient:  SPOUSE Degree of support available:   Pt has a good support system    CURRENT CONCERNS Current Concerns  Post-Acute Placement   Other Concerns:    SOCIAL WORK ASSESSMENT / PLAN CSW met with the Pt at the bedside. CSW introduced self and purpose for visit. Pt was not aware that a SNF consult was placed and had reservations about the need for SNF. Pt stated that she realizes she is weak but is concerned that if she were to go to a SNF in her current state of weakness, that her health status would decline and she would have to remain. Pt wants placement  to be a "last resort". Pt stated that she is just recovering from a knee surgery when she became ill with this flu virus and she would like to try and recovery at home. Pt stated that she is receiving PT at a location 5 mins from her home and she would like to either have HHPT or to continue to go to that facility. Pt does want to accept PT, however not a SNF.    CSW notified CM of Pt's choice.    CSW to follow for possible SNF placement if Pt were to change in health status or change her mind.   Assessment/plan status:  Information/Referral to [facility] Other assessment/ plan:   Information/referral to community resources:   CSW provided SNF list in the event that a facility would need to be chosen    PATIENT'S/FAMILY'S RESPONSE TO PLAN OF  CARE: Pt was appreciative and looking forward to speaking with the CM's for further assistance.       Leron Croak, LCSWA [facility].  [phone]

## 2012-07-12 NOTE — Progress Notes (Signed)
Brief Nutrition Note:   RD pulled to pt for malnutrition screening tool report of unintentional weight loss and poor appetite PTA.  Wt Readings from Last 5 Encounters:  07/12/12 159 lb 12.8 oz (72.485 kg)  06/02/12 163 lb 12.8 oz (74.3 kg)  06/02/12 163 lb 12.8 oz (74.3 kg)  05/27/12 163 lb 12.8 oz (74.299 kg)  10/12/11 159 lb (72.122 kg)  Weight loss is not significant in given time frame.   Body mass index is 27.42 kg/(m^2). Overweight.   Current diet: Heart Ate 100% x 2 meals this admission.   Chart reviewed, no nutrition interventions warranted at this time. Please consult as needed.   Clarene Duke RD, LDN Pager [phone] After Hours pager [phone]

## 2012-07-12 NOTE — Progress Notes (Signed)
Utilization Review Completed Holman Bonsignore J. Keiley Levey, RN, BSN, NCM [phone]  

## 2012-07-12 NOTE — Plan of Care (Signed)
Problem: Phase I Progression Outcomes Goal: O2 sats > or equal 90% or at baseline Outcome: Completed/Met Date Met:  07/12/12 Pt O2 sat 95% on RA.

## 2012-07-12 NOTE — Progress Notes (Signed)
  Echocardiogram 2D Echocardiogram has been performed.  Alexis Ortega A 07/12/2012, 12:39 PM

## 2012-07-12 NOTE — Progress Notes (Signed)
[facility] PROGRESS NOTE  Alexis Ortega WUJ:[id number] DOB: 09-May-1945 DOA: 07/11/2012   Assessment/Plan: 1. Syncope - ? Etiology - plan to keep on telemetry . Probable vaso vagal phenomenon from cough. Echocardiogram pending. 2. Influenza type B - started Tamiflu 4/29 3. Hyponatremia - ? Cause - maybe SIADH - acute onset since March. Maybe from pain and dyspnea - recheck tomorrow - asymptomatic.  4.  Leukopenia ? Cause - maybe from viral illness - recheck tomorrow.  5. Recent knee replacement - still with pain and poor function status    Code Status: full  Family Communication: son  Disposition Plan: snf   HPI/Subjective: Still c/o sore all over , sick , DOE  Objective: Filed Vitals:   07/12/12 0823 07/12/12 1022 07/12/12 1425 07/12/12 1449  BP:  120/74 100/56   Pulse:  65 76   Temp:  98.3 F (36.8 C) 98.6 F (37 C)   TempSrc:  Oral Oral   Resp:  20 20   Height:      Weight:      SpO2: 95% 96% 92% 93%    Intake/Output Summary (Last 24 hours) at 07/12/12 1701 Last data filed at 07/12/12 1425  Gross per 24 hour  Intake 1506.33 ml  Output   1651 ml  Net -144.67 ml   Filed Weights   07/11/12 2330 07/12/12 0608  Weight: 72.848 kg (160 lb 9.6 oz) 72.485 kg (159 lb 12.8 oz)    Exam:   General:  axox3  Cardiovascular: RRR   Respiratory: ctab   Abdomen: soft, nt   Musculoskeletal: intact    Data Reviewed: Basic Metabolic Panel:  Recent Labs Lab 07/11/12 1553 07/12/12 0544  NA 126* 127*  K 4.3 3.7  CL 91* 92*  CO2 28 26  GLUCOSE 85 93  BUN 13 9  CREATININE 0.79 0.75  CALCIUM 8.9 8.3*   Liver Function Tests:  Recent Labs Lab 07/11/12 1553 07/12/12 0544  AST 40* 43*  ALT 16 19  ALKPHOS 75 68  BILITOT 0.2* 0.2*  PROT 6.5 5.9*  ALBUMIN 3.6 3.2*   No results found for this basename: LIPASE, AMYLASE,  in the last 168 hours No results found for this basename: AMMONIA,  in the last 168 hours CBC:  Recent Labs Lab 07/11/12 1553  07/12/12 0544  WBC 4.6 2.8*  NEUTROABS 3.0 1.5*  HGB 11.0* 10.5*  HCT 30.6* 30.2*  MCV 89.0 89.6  PLT 196 186   Cardiac Enzymes: No results found for this basename: CKTOTAL, CKMB, CKMBINDEX, TROPONINI,  in the last 168 hours BNP (last 3 results) No results found for this basename: PROBNP,  in the last 8760 hours CBG: No results found for this basename: GLUCAP,  in the last 168 hours  Recent Results (from the past 240 hour(s))  MRSA PCR SCREENING     Status: None   Collection Time    07/11/12 11:36 PM      Result Value Range Status   MRSA by PCR NEGATIVE  NEGATIVE Final   Comment:            The GeneXpert MRSA Assay (FDA     approved for NASAL specimens     only), is one component of a     comprehensive MRSA colonization     surveillance program. It is not     intended to diagnose MRSA     infection nor to guide or     monitor treatment for     MRSA  infections.     Studies: Dg Chest 2 View  07/11/2012  *RADIOLOGY REPORT*  Clinical Data: Cough and shortness of breath.  CHEST - 2 VIEW  Comparison: 05/09/2012.  Findings: The cardiac silhouette, mediastinal and hilar contours are within normal limits and stable.  There are chronic bronchitic type lung changes but no infiltrates, edema or effusions.  The bony thorax is intact.  IMPRESSION: Chronic bronchitic type lung changes but no acute pulmonary findings.   Original Report Authenticated By: Rudie Meyer, M.D.    Ct Angio Chest W/cm &/or Wo Cm  07/11/2012  *RADIOLOGY REPORT*  Clinical Data: Short of breath.  Knee pain.  Recent knee surgery. Pulmonary embolism.  CT ANGIOGRAPHY CHEST  Technique:  Multidetector CT imaging of the chest using the standard protocol during bolus administration of intravenous contrast. Multiplanar reconstructed images including MIPs were obtained and reviewed to evaluate the vascular anatomy.  Contrast: OMNIPAQUE IOHEXOL 350 MG/ML SOLN  Comparison: Radiographs 07/11/2012.  Findings: Technically  adequate study.  Negative for pulmonary embolism.  No acute aortic abnormality.  Aortic and branch vessel atherosclerosis.  There is no axillary adenopathy.  No mediastinal or hilar adenopathy. Coronary artery atherosclerosis is present. If office based assessment of coronary risk factors has not been performed, it is now recommended.  Small hiatal hernia with patulous gastroesophageal junction.  Incidental imaging of the upper abdomen is within normal limits.  The central airways are patent.  There is no airspace disease.  Centrilobular emphysema is present.  Paraseptal emphysema at the apices.  No pneumothorax. Gas filled cystic lesion is present dorsal to the trachea, most compatible with tracheal diverticulum.  Esophageal diverticulum considered less likely.  No pleural or pericardial effusion. Thoracic spondylosis.  No aggressive osseous lesions.  Lower cervical ACDF.  IMPRESSION:  1.  Negative for pulmonary embolism.  No acute aortic abnormality. 2.  Atherosclerosis and coronary artery disease. 3.  Emphysema.   Original Report Authenticated By: Andreas Newport, M.D.     Scheduled Meds: . albuterol  2.5 mg Nebulization Q6H  . amLODipine  5 mg Oral Daily  . antiseptic oral rinse  15 mL Mouth Rinse BID  . aspirin EC  81 mg Oral Daily  . azithromycin  500 mg Intravenous Q24H  . cefTRIAXone (ROCEPHIN)  IV  1 g Intravenous Q24H  . citalopram  20 mg Oral Daily  . enoxaparin  30 mg Subcutaneous Q12H  . ipratropium  0.5 mg Nebulization Q6H  . levothyroxine  88 mcg Oral QAC breakfast  . losartan  100 mg Oral Daily  . methocarbamol  500 mg Oral QID  . oseltamivir  75 mg Oral BID  . sodium chloride  3 mL Intravenous Q12H   Continuous Infusions:   Principal Problem:   SOB (shortness of breath) Active Problems:   Cough   Tobacco abuse   HTN (hypertension)   Hypothyroidism   Syncope   Hyponatremia   Anemia      Alexis Ortega  [facility] Pager [phone]. If 7PM-7AM, please contact  night-coverage at [email or web address], password [facility] 07/12/2012, 5:01 PM  LOS: 1 day

## 2012-07-13 DIAGNOSIS — E871 Hypo-osmolality and hyponatremia: Secondary | ICD-10-CM

## 2012-07-13 DIAGNOSIS — I10 Essential (primary) hypertension: Secondary | ICD-10-CM | POA: Diagnosis not present

## 2012-07-13 DIAGNOSIS — D649 Anemia, unspecified: Secondary | ICD-10-CM | POA: Diagnosis not present

## 2012-07-13 DIAGNOSIS — J441 Chronic obstructive pulmonary disease with (acute) exacerbation: Secondary | ICD-10-CM | POA: Diagnosis not present

## 2012-07-13 DIAGNOSIS — R05 Cough: Secondary | ICD-10-CM | POA: Diagnosis not present

## 2012-07-13 LAB — BASIC METABOLIC PANEL
BUN: 8 mg/dL (ref 6–23)
Calcium: 8.5 mg/dL (ref 8.4–10.5)
Creatinine, Ser: 0.71 mg/dL (ref 0.50–1.10)
GFR calc Af Amer: >90 mL/min (ref >90)
GFR calc non Af Amer: 88 mL/min — ABNORMAL LOW (ref >90)
Glucose, Bld: 100 mg/dL — ABNORMAL HIGH (ref 70–99)
Potassium: 4.1 mEq/L (ref 3.5–5.1)

## 2012-07-13 LAB — CBC
HCT: 31.2 % — ABNORMAL LOW (ref 36.0–46.0)
Hemoglobin: 10.7 g/dL — ABNORMAL LOW (ref 12.0–15.0)
MCH: 30.9 pg (ref 26.0–34.0)
MCHC: 34.3 g/dL (ref 30.0–36.0)
MCV: 90.2 fL (ref 78.0–100.0)
RDW: 13.4 % (ref 11.5–15.5)

## 2012-07-13 MED ORDER — ENSURE PUDDING PO PUDG: 1.0000 | Freq: Two times a day (BID) | ORAL | Status: DC

## 2012-07-13 MED ORDER — ENSURE COMPLETE PO LIQD
237.0000 mL | Freq: Two times a day (BID) | ORAL | Status: DC
  Administered 2012-07-13 – 2012-07-14 (×4): 237 mL via ORAL

## 2012-07-13 NOTE — Evaluation (Signed)
Occupational Therapy Evaluation Patient Details Name: Alexis Ortega MRN: [id number] DOB: [birth year] Today's Date: 07/13/2012 Time: 4098-1191 OT Time Calculation (min): 29 min  OT Assessment / Plan / Recommendation Clinical Impression  This 67 yo female admitted with shortness of breath and cough and found to have the flu presents to acute OT with problems below. Will benefit from acute OT without need for follow up.    OT Assessment  Patient needs continued OT Services    Follow Up Recommendations  No OT follow up    Barriers to Discharge None    Equipment Recommendations  None recommended by OT       Frequency  Min 2X/week    Precautions / Restrictions Precautions Precautions: None Restrictions Weight Bearing Restrictions: No   Pertinent Vitals/Pain DOE; however O2 sats good (96%) on RA    ADL  Eating/Feeding: Simulated;Independent Where Assessed - Eating/Feeding: Edge of bed Grooming: Simulated;Set up Where Assessed - Grooming: Unsupported sitting Upper Body Bathing: Simulated;Set up Where Assessed - Upper Body Bathing: Supported sitting Lower Body Bathing: Simulated;Set up Where Assessed - Lower Body Bathing: Unsupported sit to stand Upper Body Dressing: Simulated Where Assessed - Upper Body Dressing: Unsupported sitting Lower Body Dressing: Performed;Set up Where Assessed - Lower Body Dressing: Unsupported sit to stand Toilet Transfer: Performed;Min guard Toilet Transfer Method: Sit to Barista: Regular height toilet Toileting - Clothing Manipulation and Hygiene: Performed;Independent Where Assessed - Toileting Clothing Manipulation and Hygiene: Standing Equipment Used: Gait belt Transfers/Ambulation Related to ADLs: min guard A for all    OT Diagnosis: Generalized weakness  OT Problem List: Decreased strength;Decreased activity tolerance;Cardiopulmonary status limiting activity (DOE) OT Treatment Interventions: Self-care/ADL  training;Therapeutic activities;Patient/family education;Balance training;Energy conservation   OT Goals Acute Rehab OT Goals OT Goal Formulation: With patient Time For Goal Achievement: 07/20/12 Potential to Achieve Goals: Good ADL Goals Pt Will Perform Grooming: Independently;Unsupported;Standing at sink ADL Goal: Grooming - Progress: Goal set today Pt Will Transfer to Toilet: Independently;Ambulation;Regular height toilet ADL Goal: Toilet Transfer - Progress: Goal set today Miscellaneous OT Goals Miscellaneous OT Goal #1: Pt will be I in gathering items in her room OT Goal: Miscellaneous Goal #1 - Progress: Goal set today Miscellaneous OT Goal #2: Pt will be aware of energy conservation stratgies that may help her while she is recovering from  the flu. OT Goal: Miscellaneous Goal #2 - Progress: Goal set today  Visit Information  Last OT Received On: 07/13/12 Assistance Needed: +1 PT/OT Co-Evaluation/Treatment: Yes    Subjective Data  Subjective: I was doing great until this hit   Prior Functioning     Home Living Lives With: Spouse Available Help at Discharge: Family;Available PRN/intermittently Type of Home: House Home Access: Stairs to enter [employer] of Steps: 1 Entrance Stairs-Rails: Right Home Layout: One level Bathroom Shower/Tub: Health visitor: Handicapped height Bathroom Accessibility: Yes How Accessible: Accessible via walker Home Adaptive Equipment: Grab bars in shower;Shower chair with back;Walker - rolling;Straight cane;Grab bars around toilet Prior Function Level of Independence: Independent Able to Take Stairs?: Yes Driving: Yes Vocation: Retired Musician: No difficulties Dominant Hand: Right         Vision/Perception Vision - History Baseline Vision: No visual deficits Patient Visual Report: No change from baseline   Cognition  Cognition Arousal/Alertness: Awake/alert Behavior During  Therapy: WFL for tasks assessed/performed Overall Cognitive Status: Within Functional Limits for tasks assessed    Extremity/Trunk Assessment Right Upper Extremity Assessment RUE ROM/Strength/Tone: Within functional levels Left Upper  Extremity Assessment LUE ROM/Strength/Tone: Within functional levels Right Lower Extremity Assessment RLE ROM/Strength/Tone: Within functional levels Left Lower Extremity Assessment LLE ROM/Strength/Tone: Within functional levels Trunk Assessment Trunk Assessment: Normal     Mobility Bed Mobility Bed Mobility: Supine to Sit;Sitting - Scoot to Edge of Bed Supine to Sit: 6: Modified independent (Device/Increase time);HOB elevated;With rails Sitting - Scoot to Edge of Bed: 6: Modified independent (Device/Increase time);With rail Transfers Transfers: Sit to Stand;Stand to Sit Sit to Stand: 4: Min guard;With upper extremity assist;From bed Stand to Sit: 4: Min guard;With upper extremity assist;With armrests;To chair/3-in-1           End of Session OT - End of Session Equipment Utilized During Treatment: Gait belt Activity Tolerance: Patient limited by fatigue Patient left: in chair;with call bell/phone within reach       Evette Georges [phone] 07/13/2012, 10:54 AM

## 2012-07-13 NOTE — Evaluation (Signed)
Physical Therapy Evaluation Patient Details Name: Alexis Ortega MRN: [id number] DOB: [birth year] Today's Date: 07/13/2012 Time: 4098-1191 PT Time Calculation (min): 29 min  PT Assessment / Plan / Recommendation Clinical Impression  Pt is an 67 yo female admitted for cough and weakness, found to have type B flu and is on droplet prec. Pt with recent L TKA 06/01/2012 and attendig outpatient pt. Pt attending outpatient PT. Pt functioning near baseline. Pt to benefit from acute PT to address L knee rehabilitation and achieve safe mod I function.    PT Assessment  Patient needs continued PT services    Follow Up Recommendations  Outpatient PT;Supervision - Intermittent (con't outpatient)    Does the patient have the potential to tolerate intense rehabilitation      Barriers to Discharge None      Equipment Recommendations  None recommended by PT    Recommendations for Other Services     Frequency Min 2X/week    Precautions / Restrictions Precautions Precautions: None Precaution Comments: droplet Restrictions Weight Bearing Restrictions: No   Pertinent Vitals/Pain C/o muscle aches      Mobility  Bed Mobility Bed Mobility: Supine to Sit;Sitting - Scoot to Edge of Bed Supine to Sit: 6: Modified independent (Device/Increase time);HOB elevated;With rails Sitting - Scoot to Edge of Bed: 6: Modified independent (Device/Increase time);With rail Transfers Transfers: Sit to Stand;Stand to Sit Sit to Stand: 4: Min guard;With upper extremity assist;From bed Stand to Sit: 4: Min guard;With upper extremity assist;With armrests;To chair/3-in-1 Details for Transfer Assistance: pt mildly unsteady upon initial stand due to weakness from flu and muscle aches Ambulation/Gait Ambulation/Gait Assistance: 4: Min guard Ambulation Distance (Feet): 150 Feet Assistive device: None Ambulation/Gait Assistance Details: pt mildly unsteady with antalgic gait. pt with generalized weakness due to flu but  no evident episode of LOB. Pt remains to have antalgic L LE limp Gait Pattern: Step-through pattern;Decreased stride length;Decreased step length - left;Decreased stance time - left Gait velocity: wfl Stairs: No    Exercises     PT Diagnosis: Difficulty walking;Generalized weakness  PT Problem List: Decreased strength;Decreased activity tolerance;Decreased balance;Decreased mobility PT Treatment Interventions: Gait training;Therapeutic exercise;Stair training   PT Goals Acute Rehab PT Goals PT Goal Formulation: With patient Time For Goal Achievement: 07/20/12 Potential to Achieve Goals: Good Pt will go Supine/Side to Sit: Independently;with HOB 0 degrees PT Goal: Supine/Side to Sit - Progress: Goal set today Pt will go Sit to Stand: with modified independence PT Goal: Sit to Stand - Progress: Goal set today Pt will Ambulate: >150 feet;Independently (no device ) PT Goal: Ambulate - Progress: Goal set today Pt will Go Up / Down Stairs: 3-5 stairs;with min assist;with rail(s) PT Goal: Up/Down Stairs - Progress: Goal set today Pt will Perform Home Exercise Program: Independently PT Goal: Perform Home Exercise Program - Progress: Goal set today  Visit Information  Last PT Received On: 07/13/12 Assistance Needed: +1    Subjective Data  Subjective: Pt received supine in bed agreeable to PT.   Prior Functioning  Home Living Lives With: Spouse Available Help at Discharge: Family;Available PRN/intermittently Type of Home: House Home Access: Stairs to enter [employer] of Steps: 1 Entrance Stairs-Rails: Right Home Layout: One level Bathroom Shower/Tub: Health visitor: Handicapped height Bathroom Accessibility: Yes How Accessible: Accessible via walker Home Adaptive Equipment: Grab bars in shower;Shower chair with back;Walker - rolling;Straight cane;Grab bars around toilet Prior Function Level of Independence: Independent Able to Take Stairs?:  Yes Driving: Yes Vocation: Retired Special educational needs teacher  Communication: No difficulties Dominant Hand: Right    Cognition  Cognition Arousal/Alertness: Awake/alert Behavior During Therapy: WFL for tasks assessed/performed Overall Cognitive Status: Within Functional Limits for tasks assessed    Extremity/Trunk Assessment Right Upper Extremity Assessment RUE ROM/Strength/Tone: Within functional levels Left Upper Extremity Assessment LUE ROM/Strength/Tone: Within functional levels Right Lower Extremity Assessment RLE ROM/Strength/Tone: Within functional levels Left Lower Extremity Assessment LLE ROM/Strength/Tone: Within functional levels LLE ROM/Strength/Tone Deficits: hip and ankle WFL, knee: 0-115 deg ROM, strength at 4/5 due to recent TKA Trunk Assessment Trunk Assessment: Normal   Balance Balance Balance Assessed: Yes Static Sitting Balance Static Sitting - Balance Support: No upper extremity supported Static Sitting - Level of Assistance: 6: Modified independent (Device/Increase time) Static Sitting - Comment/# of Minutes: 5  End of Session PT - End of Session Equipment Utilized During Treatment: Gait belt Activity Tolerance: Patient limited by fatigue Patient left: in chair;with call bell/phone within reach Nurse Communication: Mobility status  GP     Marcene Brawn 07/13/2012, 11:54 AM  Lewis Shock, PT, DPT Pager #: [phone] Office #: [phone]

## 2012-07-13 NOTE — Progress Notes (Signed)
[facility] PROGRESS NOTE  Alexis Ortega WUJ:[id number] DOB: 07/27/1945 DOA: 07/11/2012 PCP: Pcp Not In System  Assessment/Plan: 1. Syncope - Most likely vaso vagal phenomenon from cough. Echocardiogram pending. 2. Influenza type B - started Tamiflu 4/29 3. Hyponatremia - improving with IVF and improved oral intake.  Suspect due to low oral sodium solute intake. 4. Leukopenia ? Cause - maybe from viral illness and myeloid suppression - recheck tomorrow.  5. Recent knee replacement - still with pain and poor function status    Code Status: full  Family Communication: son  Disposition Plan: snf    Consultants:  none  Procedures:  none  Antibiotics:  On tamiflu  HPI/Subjective: No new complaints. No acute issues overnight.  Objective: Filed Vitals:   07/13/12 0527 07/13/12 0748 07/13/12 0911 07/13/12 1430  BP: 113/75   144/73  Pulse: 62  75 68  Temp: 98.3 F (36.8 C)   98.4 F (36.9 C)  TempSrc: Oral   Oral  Resp: 18   18  Height:      Weight: 72.2 kg (159 lb 2.8 oz)     SpO2: 98% 93% 97% 92%    Intake/Output Summary (Last 24 hours) at 07/13/12 1606 Last data filed at 07/13/12 1348  Gross per 24 hour  Intake   1320 ml  Output    200 ml  Net   1120 ml   Filed Weights   07/11/12 2330 07/12/12 0608 07/13/12 0527  Weight: 72.848 kg (160 lb 9.6 oz) 72.485 kg (159 lb 12.8 oz) 72.2 kg (159 lb 2.8 oz)    Exam:   General:  Pt in NAD, Alert and Oriented  Cardiovascular: RRR, NO MRG  Respiratory: CTA BL, some rhales  Abdomen: soft, NT, ND  Musculoskeletal: no cyanosis or clubbing   Data Reviewed: Basic Metabolic Panel:  Recent Labs Lab 07/11/12 1553 07/12/12 0544 07/13/12 0505  NA 126* 127* 130*  K 4.3 3.7 4.1  CL 91* 92* 96  CO2 28 26 24   GLUCOSE 85 93 100*  BUN 13 9 8   CREATININE 0.79 0.75 0.71  CALCIUM 8.9 8.3* 8.5   Liver Function Tests:  Recent Labs Lab 07/11/12 1553 07/12/12 0544  AST 40* 43*  ALT 16 19  ALKPHOS 75 68   BILITOT 0.2* 0.2*  PROT 6.5 5.9*  ALBUMIN 3.6 3.2*   No results found for this basename: LIPASE, AMYLASE,  in the last 168 hours No results found for this basename: AMMONIA,  in the last 168 hours CBC:  Recent Labs Lab 07/11/12 1553 07/12/12 0544 07/13/12 0505  WBC 4.6 2.8* 3.3*  NEUTROABS 3.0 1.5*  --   HGB 11.0* 10.5* 10.7*  HCT 30.6* 30.2* 31.2*  MCV 89.0 89.6 90.2  PLT 196 186 188   Cardiac Enzymes: No results found for this basename: CKTOTAL, CKMB, CKMBINDEX, TROPONINI,  in the last 168 hours BNP (last 3 results) No results found for this basename: PROBNP,  in the last 8760 hours CBG: No results found for this basename: GLUCAP,  in the last 168 hours  Recent Results (from the past 240 hour(s))  MRSA PCR SCREENING     Status: None   Collection Time    07/11/12 11:36 PM      Result Value Range Status   MRSA by PCR NEGATIVE  NEGATIVE Final   Comment:            The GeneXpert MRSA Assay (FDA     approved for NASAL specimens  only), is one component of a     comprehensive MRSA colonization     surveillance program. It is not     intended to diagnose MRSA     infection nor to guide or     monitor treatment for     MRSA infections.     Studies: Dg Chest 2 View  07/11/2012  *RADIOLOGY REPORT*  Clinical Data: Cough and shortness of breath.  CHEST - 2 VIEW  Comparison: 05/09/2012.  Findings: The cardiac silhouette, mediastinal and hilar contours are within normal limits and stable.  There are chronic bronchitic type lung changes but no infiltrates, edema or effusions.  The bony thorax is intact.  IMPRESSION: Chronic bronchitic type lung changes but no acute pulmonary findings.   Original Report Authenticated By: Rudie Meyer, M.D.    Ct Angio Chest W/cm &/or Wo Cm  07/11/2012  *RADIOLOGY REPORT*  Clinical Data: Short of breath.  Knee pain.  Recent knee surgery. Pulmonary embolism.  CT ANGIOGRAPHY CHEST  Technique:  Multidetector CT imaging of the chest using the  standard protocol during bolus administration of intravenous contrast. Multiplanar reconstructed images including MIPs were obtained and reviewed to evaluate the vascular anatomy.  Contrast: OMNIPAQUE IOHEXOL 350 MG/ML SOLN  Comparison: Radiographs 07/11/2012.  Findings: Technically adequate study.  Negative for pulmonary embolism.  No acute aortic abnormality.  Aortic and branch vessel atherosclerosis.  There is no axillary adenopathy.  No mediastinal or hilar adenopathy. Coronary artery atherosclerosis is present. If office based assessment of coronary risk factors has not been performed, it is now recommended.  Small hiatal hernia with patulous gastroesophageal junction.  Incidental imaging of the upper abdomen is within normal limits.  The central airways are patent.  There is no airspace disease.  Centrilobular emphysema is present.  Paraseptal emphysema at the apices.  No pneumothorax. Gas filled cystic lesion is present dorsal to the trachea, most compatible with tracheal diverticulum.  Esophageal diverticulum considered less likely.  No pleural or pericardial effusion. Thoracic spondylosis.  No aggressive osseous lesions.  Lower cervical ACDF.  IMPRESSION:  1.  Negative for pulmonary embolism.  No acute aortic abnormality. 2.  Atherosclerosis and coronary artery disease. 3.  Emphysema.   Original Report Authenticated By: Andreas Newport, M.D.     Scheduled Meds: . albuterol  2.5 mg Nebulization Q6H  . amLODipine  5 mg Oral Daily  . antiseptic oral rinse  15 mL Mouth Rinse BID  . aspirin EC  81 mg Oral Daily  . citalopram  20 mg Oral Daily  . enoxaparin  30 mg Subcutaneous Q12H  . feeding supplement  237 mL Oral BID BM  . ipratropium  0.5 mg Nebulization Q6H  . levothyroxine  88 mcg Oral QAC breakfast  . losartan  100 mg Oral Daily  . methocarbamol  500 mg Oral QID  . oseltamivir  75 mg Oral BID  . sodium chloride  3 mL Intravenous Q12H   Continuous Infusions:   Principal Problem:    SOB (shortness of breath) Active Problems:   Cough   Tobacco abuse   HTN (hypertension)   Hypothyroidism   Syncope   Hyponatremia   Anemia    Time spent: > 35 minutes    Penny Pia  [facility] Pager [phone]. If 7PM-7AM, please contact night-coverage at [email or web address], password [facility] 07/13/2012, 4:06 PM  LOS: 2 days

## 2012-07-14 DIAGNOSIS — I10 Essential (primary) hypertension: Secondary | ICD-10-CM | POA: Diagnosis not present

## 2012-07-14 DIAGNOSIS — R05 Cough: Secondary | ICD-10-CM | POA: Diagnosis not present

## 2012-07-14 DIAGNOSIS — J441 Chronic obstructive pulmonary disease with (acute) exacerbation: Secondary | ICD-10-CM | POA: Diagnosis not present

## 2012-07-14 DIAGNOSIS — D649 Anemia, unspecified: Secondary | ICD-10-CM | POA: Diagnosis not present

## 2012-07-14 MED ORDER — OSELTAMIVIR PHOSPHATE 75 MG PO CAPS: 75.0000 mg | ORAL_CAPSULE | Freq: Two times a day (BID) | ORAL | Status: DC

## 2012-07-14 MED ORDER — AMLODIPINE BESYLATE 5 MG PO TABS: 5.0000 mg | ORAL_TABLET | Freq: Every day | ORAL | Status: DC

## 2012-07-14 MED ORDER — ENSURE COMPLETE PO LIQD: 237.0000 mL | Freq: Two times a day (BID) | ORAL | Status: DC

## 2012-07-14 NOTE — Discharge Summary (Signed)
Physician Discharge Summary  Alexis Ortega:[id number] DOB: 05/16/1945 DOA: 07/11/2012  PCP: Pcp Not In System  Admit date: 07/11/2012 Discharge date: 07/14/2012  Time spent: > 35 minutes  Recommendations for Outpatient Follow-up:  1. Please be sure to follow up with your primary care physician in 1-2 weeks or sooner should any new concerns arise.  Discharge Diagnoses:  Principal Problem:   SOB (shortness of breath) Active Problems:   Cough   Tobacco abuse   HTN (hypertension)   Hypothyroidism   Syncope   Hyponatremia   Anemia   Discharge Condition: stable  Diet recommendation: Heart healthy  Filed Weights   07/12/12 0608 07/13/12 0527 07/14/12 0443  Weight: 72.485 kg (159 lb 12.8 oz) 72.2 kg (159 lb 2.8 oz) 72.258 kg (159 lb 4.8 oz)    History of present illness:  67 y/o with recent knee surgery last month, h/o HTN and hypothyroidism. Presenting with Influenza B.  Hospital Course:  1. Syncope - Most likely vaso vagal phenomenon from cough. Echocardiogram Showing an EF of 65-70 percent with grade I diastolic dysfuction 2. Influenza type B - started Tamiflu 4/29.  Will provide script so patient can complete a 5 day total regimen of tamiflu. 3. Hyponatremia - improving with IVF and improved oral intake. Suspect due to low oral sodium solute intake.  Should improved with continued improved oral intake. 4. Leukopenia ? Cause - maybe from viral illness and myeloid suppression - On last check trending up. 5. Recent knee replacement - Patient to continue physical therapy at home as indicated by her orthopaedic surgeon.  Procedures:  Echocardiogram  Consultations:  None  Discharge Exam: Filed Vitals:   07/14/12 0153 07/14/12 0443 07/14/12 0744 07/14/12 1341  BP:  123/64  135/73  Pulse:  63  66  Temp:  98.4 F (36.9 C)  98 F (36.7 C)  TempSrc:  Oral  Oral  Resp:  18  20  Height:      Weight:  72.258 kg (159 lb 4.8 oz)    SpO2: 90% 93% 93% 93%    General: Pt  in NAD, Alert and Awake Cardiovascular: RRR, No MRG Respiratory: CTA BL, no wheezes, no increased work of breathing on room air.  Discharge Instructions  Discharge Orders   Future Orders Complete By Expires     Call MD for:  redness, tenderness, or signs of infection (pain, swelling, redness, odor or green/yellow discharge around incision site)  As directed     Call MD for:  severe uncontrolled pain  As directed     Call MD for:  temperature >100.4  As directed     Diet - low sodium heart healthy  As directed     Discharge instructions  As directed     Comments:      Please be sure to follow up with your primary care physician in 1-2 weeks.  Also you will need to take your tamiflu as directed.  You are to continue your physical therapy for your recent knee replacement as directed by your orthopaedic surgeon.    Increase activity slowly  As directed         Medication List    STOP taking these medications       doxycycline 100 MG tablet  Commonly known as:  ADOXA      TAKE these medications       ALPRAZolam 0.5 MG tablet  Commonly known as:  XANAX  Take 0.5 mg by mouth at bedtime as  needed for sleep.     amLODipine 5 MG tablet  Commonly known as:  NORVASC  Take 1 tablet (5 mg total) by mouth daily.     aspirin EC 81 MG tablet  Take 81 mg by mouth daily.     benzonatate 100 MG capsule  Commonly known as:  TESSALON  Take 100 mg by mouth 3 (three) times daily as needed for cough.     CALCIUM PO  Take 1 tablet by mouth daily.     citalopram 20 MG tablet  Commonly known as:  CELEXA  Take 20 mg by mouth daily.     enoxaparin 30 MG/0.3ML injection  Commonly known as:  LOVENOX  Inject 0.3 mLs (30 mg total) into the skin every 12 (twelve) hours.     feeding supplement Liqd  Take 237 mLs by mouth 2 (two) times daily between meals.     levothyroxine 88 MCG tablet  Commonly known as:  SYNTHROID, LEVOTHROID  Take 88 mcg by mouth daily before breakfast.     losartan 100  MG tablet  Commonly known as:  COZAAR  Take 100 mg by mouth daily.     methocarbamol 500 MG tablet  Commonly known as:  ROBAXIN  Take 500 mg by mouth 4 (four) times daily.     oseltamivir 75 MG capsule  Commonly known as:  TAMIFLU  Take 1 capsule (75 mg total) by mouth 2 (two) times daily.     oxyCODONE-acetaminophen 5-325 MG per tablet  Commonly known as:  PERCOCET/ROXICET  Take 1-2 tablets by mouth every 4 (four) hours as needed for pain.       Allergies  Allergen Reactions  . Prednisone Other (See Comments)    Feet and arms itch, sting and turn red.  . Tramadol Itching      The results of significant diagnostics from this hospitalization (including imaging, microbiology, ancillary and laboratory) are listed below for reference.    Significant Diagnostic Studies: Dg Chest 2 View  07/11/2012  *RADIOLOGY REPORT*  Clinical Data: Cough and shortness of breath.  CHEST - 2 VIEW  Comparison: 05/09/2012.  Findings: The cardiac silhouette, mediastinal and hilar contours are within normal limits and stable.  There are chronic bronchitic type lung changes but no infiltrates, edema or effusions.  The bony thorax is intact.  IMPRESSION: Chronic bronchitic type lung changes but no acute pulmonary findings.   Original Report Authenticated By: Rudie Meyer, M.D.    Ct Angio Chest W/cm &/or Wo Cm  07/11/2012  *RADIOLOGY REPORT*  Clinical Data: Short of breath.  Knee pain.  Recent knee surgery. Pulmonary embolism.  CT ANGIOGRAPHY CHEST  Technique:  Multidetector CT imaging of the chest using the standard protocol during bolus administration of intravenous contrast. Multiplanar reconstructed images including MIPs were obtained and reviewed to evaluate the vascular anatomy.  Contrast: OMNIPAQUE IOHEXOL 350 MG/ML SOLN  Comparison: Radiographs 07/11/2012.  Findings: Technically adequate study.  Negative for pulmonary embolism.  No acute aortic abnormality.  Aortic and branch vessel  atherosclerosis.  There is no axillary adenopathy.  No mediastinal or hilar adenopathy. Coronary artery atherosclerosis is present. If office based assessment of coronary risk factors has not been performed, it is now recommended.  Small hiatal hernia with patulous gastroesophageal junction.  Incidental imaging of the upper abdomen is within normal limits.  The central airways are patent.  There is no airspace disease.  Centrilobular emphysema is present.  Paraseptal emphysema at the apices.  No pneumothorax. Gas  filled cystic lesion is present dorsal to the trachea, most compatible with tracheal diverticulum.  Esophageal diverticulum considered less likely.  No pleural or pericardial effusion. Thoracic spondylosis.  No aggressive osseous lesions.  Lower cervical ACDF.  IMPRESSION:  1.  Negative for pulmonary embolism.  No acute aortic abnormality. 2.  Atherosclerosis and coronary artery disease. 3.  Emphysema.   Original Report Authenticated By: Andreas Newport, M.D.     Microbiology: Recent Results (from the past 240 hour(s))  MRSA PCR SCREENING     Status: None   Collection Time    07/11/12 11:36 PM      Result Value Range Status   MRSA by PCR NEGATIVE  NEGATIVE Final   Comment:            The GeneXpert MRSA Assay (FDA     approved for NASAL specimens     only), is one component of a     comprehensive MRSA colonization     surveillance program. It is not     intended to diagnose MRSA     infection nor to guide or     monitor treatment for     MRSA infections.     Labs: Basic Metabolic Panel:  Recent Labs Lab 07/11/12 1553 07/12/12 0544 07/13/12 0505  NA 126* 127* 130*  K 4.3 3.7 4.1  CL 91* 92* 96  CO2 28 26 24   GLUCOSE 85 93 100*  BUN 13 9 8   CREATININE 0.79 0.75 0.71  CALCIUM 8.9 8.3* 8.5   Liver Function Tests:  Recent Labs Lab 07/11/12 1553 07/12/12 0544  AST 40* 43*  ALT 16 19  ALKPHOS 75 68  BILITOT 0.2* 0.2*  PROT 6.5 5.9*  ALBUMIN 3.6 3.2*   No results  found for this basename: LIPASE, AMYLASE,  in the last 168 hours No results found for this basename: AMMONIA,  in the last 168 hours CBC:  Recent Labs Lab 07/11/12 1553 07/12/12 0544 07/13/12 0505  WBC 4.6 2.8* 3.3*  NEUTROABS 3.0 1.5*  --   HGB 11.0* 10.5* 10.7*  HCT 30.6* 30.2* 31.2*  MCV 89.0 89.6 90.2  PLT 196 186 188   Cardiac Enzymes: No results found for this basename: CKTOTAL, CKMB, CKMBINDEX, TROPONINI,  in the last 168 hours BNP: BNP (last 3 results) No results found for this basename: PROBNP,  in the last 8760 hours CBG: No results found for this basename: GLUCAP,  in the last 168 hours     Signed:  Penny Pia  [facility] 07/14/2012, 1:48 PM

## 2012-07-14 NOTE — Progress Notes (Signed)
I have reviewed and agree with this note.  Cathy Francesa Eugenio, OTR/L [phone] 07/14/2012   

## 2012-07-14 NOTE — Progress Notes (Signed)
Occupational Therapy Treatment Patient Details Name: Alexis Ortega MRN: [id number] DOB: Apr 26, 1945 Today's Date: 07/14/2012 Time: 4098-1191 OT Time Calculation (min): 23 min  OT Assessment / Plan / Recommendation Comments on Treatment Session Pt has progressed.  Pt upon arrival began to sit EOB and ambulated to the bathroom with increase time and furniture walking.  Pt was educated and provided a handout on energy conservation to asisst her in self care task upon d/c.  Pt is appropriate for no OT follow up    Follow Up Recommendations  No OT follow up       Equipment Recommendations  None recommended by OT       Frequency Min 2X/week   Plan Discharge plan remains appropriate    Precautions / Restrictions Precautions Precautions: None Precaution Comments: droplet Restrictions Weight Bearing Restrictions: No       ADL  Grooming: Performed;Brushing hair;Independent;Wash/dry hands Where Assessed - Grooming: Unsupported standing Toilet Transfer: Performed;Modified independent Toilet Transfer Method: Sit to Barista: Regular height toilet Toileting - Clothing Manipulation and Hygiene: Performed;Independent Where Assessed - Toileting Clothing Manipulation and Hygiene: Standing Equipment Used: Other (comment) (none) Transfers/Ambulation Related to ADLs:  Mod I for ambulation and transfers ADL Comments: Pt was unsupported while combing hair.  Pt was able to search for items needed with MOD I and placed them in her luggage to go home with no LOB.     OT Goals ADL Goals ADL Goal: Grooming - Progress: Progressing toward goals ADL Goal: Toilet Transfer - Progress: Progressing toward goals Miscellaneous OT Goals OT Goal: Miscellaneous Goal #1 - Progress: Progressing toward goals OT Goal: Miscellaneous Goal #2 - Progress: Progressing toward goals  Visit Information  Last OT Received On: 07/14/12 Assistance Needed: +1    Subjective Data  Subjective: I'm  independence(refering when I asked her if there was someone to help her at home)      Cognition  Cognition Arousal/Alertness: Awake/alert Behavior During Therapy: WFL for tasks assessed/performed Overall Cognitive Status: Within Functional Limits for tasks assessed    Mobility  Bed Mobility Bed Mobility: Supine to Sit;Sitting - Scoot to Edge of Bed Supine to Sit: 6: Modified independent (Device/Increase time);HOB elevated;With rails Sitting - Scoot to Edge of Bed: 6: Modified independent (Device/Increase time) Details for Bed Mobility Assistance: No VCs needed  Transfers Transfers: Sit to Stand;Stand to Sit Sit to Stand: 6: Modified independent (Device/Increase time);From bed;From toilet;From elevated surface;Without upper extremity assist Stand to Sit: 6: Modified independent (Device/Increase time);Without upper extremity assist;To elevated surface;To bed;To toilet       Balance Balance Balance Assessed: Yes Dynamic Standing Balance Dynamic Standing - Balance Support: No upper extremity supported;During functional activity Dynamic Standing - Level of Assistance: 6: Modified independent (Device/Increase time) Dynamic Standing - Comments: Pt was able to comb hair and washing hands while standing    End of Session OT - End of Session Equipment Utilized During Treatment: Other (comment) (none) Activity Tolerance: Patient tolerated treatment well Patient left: Other (comment);with call bell/phone within reach (EOB)       Ras Kollman, [place] 07/14/2012, 4:37 PM

## 2012-07-17 DIAGNOSIS — Z79899 Other long term (current) drug therapy: Secondary | ICD-10-CM | POA: Diagnosis not present

## 2012-07-17 DIAGNOSIS — K5289 Other specified noninfective gastroenteritis and colitis: Secondary | ICD-10-CM | POA: Diagnosis not present

## 2012-07-17 DIAGNOSIS — F17200 Nicotine dependence, unspecified, uncomplicated: Secondary | ICD-10-CM | POA: Diagnosis not present

## 2012-07-17 DIAGNOSIS — Z7982 Long term (current) use of aspirin: Secondary | ICD-10-CM | POA: Diagnosis not present

## 2012-07-17 DIAGNOSIS — R5381 Other malaise: Secondary | ICD-10-CM | POA: Diagnosis not present

## 2012-07-17 DIAGNOSIS — E876 Hypokalemia: Secondary | ICD-10-CM | POA: Diagnosis not present

## 2012-07-17 DIAGNOSIS — I10 Essential (primary) hypertension: Secondary | ICD-10-CM | POA: Diagnosis not present

## 2012-07-17 DIAGNOSIS — F329 Major depressive disorder, single episode, unspecified: Secondary | ICD-10-CM | POA: Diagnosis not present

## 2012-07-17 DIAGNOSIS — E039 Hypothyroidism, unspecified: Secondary | ICD-10-CM | POA: Diagnosis not present

## 2012-07-17 DIAGNOSIS — M545 Low back pain: Secondary | ICD-10-CM | POA: Diagnosis not present

## 2012-07-17 DIAGNOSIS — J449 Chronic obstructive pulmonary disease, unspecified: Secondary | ICD-10-CM | POA: Diagnosis not present

## 2012-07-17 DIAGNOSIS — Z888 Allergy status to other drugs, medicaments and biological substances status: Secondary | ICD-10-CM | POA: Diagnosis not present

## 2012-07-17 DIAGNOSIS — E860 Dehydration: Secondary | ICD-10-CM | POA: Diagnosis not present

## 2012-07-17 DIAGNOSIS — G8929 Other chronic pain: Secondary | ICD-10-CM | POA: Diagnosis not present

## 2012-07-18 DIAGNOSIS — K5289 Other specified noninfective gastroenteritis and colitis: Secondary | ICD-10-CM | POA: Diagnosis not present

## 2012-07-18 DIAGNOSIS — I10 Essential (primary) hypertension: Secondary | ICD-10-CM | POA: Diagnosis not present

## 2012-07-18 DIAGNOSIS — J449 Chronic obstructive pulmonary disease, unspecified: Secondary | ICD-10-CM | POA: Diagnosis not present

## 2012-07-18 DIAGNOSIS — F329 Major depressive disorder, single episode, unspecified: Secondary | ICD-10-CM | POA: Diagnosis not present

## 2012-07-18 DIAGNOSIS — R5381 Other malaise: Secondary | ICD-10-CM | POA: Diagnosis not present

## 2012-07-18 DIAGNOSIS — E860 Dehydration: Secondary | ICD-10-CM | POA: Diagnosis not present

## 2012-07-18 DIAGNOSIS — E876 Hypokalemia: Secondary | ICD-10-CM | POA: Diagnosis not present

## 2012-07-25 DIAGNOSIS — IMO0001 Reserved for inherently not codable concepts without codable children: Secondary | ICD-10-CM | POA: Diagnosis not present

## 2012-07-25 DIAGNOSIS — M25569 Pain in unspecified knee: Secondary | ICD-10-CM | POA: Diagnosis not present

## 2012-07-25 DIAGNOSIS — R262 Difficulty in walking, not elsewhere classified: Secondary | ICD-10-CM | POA: Diagnosis not present

## 2012-07-27 DIAGNOSIS — IMO0001 Reserved for inherently not codable concepts without codable children: Secondary | ICD-10-CM | POA: Diagnosis not present

## 2012-07-27 DIAGNOSIS — M25569 Pain in unspecified knee: Secondary | ICD-10-CM | POA: Diagnosis not present

## 2012-07-27 DIAGNOSIS — R262 Difficulty in walking, not elsewhere classified: Secondary | ICD-10-CM | POA: Diagnosis not present

## 2012-07-29 DIAGNOSIS — IMO0001 Reserved for inherently not codable concepts without codable children: Secondary | ICD-10-CM | POA: Diagnosis not present

## 2012-07-29 DIAGNOSIS — R262 Difficulty in walking, not elsewhere classified: Secondary | ICD-10-CM | POA: Diagnosis not present

## 2012-07-29 DIAGNOSIS — M25569 Pain in unspecified knee: Secondary | ICD-10-CM | POA: Diagnosis not present

## 2012-08-01 DIAGNOSIS — J449 Chronic obstructive pulmonary disease, unspecified: Secondary | ICD-10-CM | POA: Diagnosis not present

## 2012-08-02 DIAGNOSIS — IMO0001 Reserved for inherently not codable concepts without codable children: Secondary | ICD-10-CM | POA: Diagnosis not present

## 2012-08-02 DIAGNOSIS — M25569 Pain in unspecified knee: Secondary | ICD-10-CM | POA: Diagnosis not present

## 2012-08-02 DIAGNOSIS — R262 Difficulty in walking, not elsewhere classified: Secondary | ICD-10-CM | POA: Diagnosis not present

## 2012-08-03 DIAGNOSIS — IMO0001 Reserved for inherently not codable concepts without codable children: Secondary | ICD-10-CM | POA: Diagnosis not present

## 2012-08-03 DIAGNOSIS — R262 Difficulty in walking, not elsewhere classified: Secondary | ICD-10-CM | POA: Diagnosis not present

## 2012-08-03 DIAGNOSIS — M25569 Pain in unspecified knee: Secondary | ICD-10-CM | POA: Diagnosis not present

## 2012-08-05 DIAGNOSIS — R262 Difficulty in walking, not elsewhere classified: Secondary | ICD-10-CM | POA: Diagnosis not present

## 2012-08-05 DIAGNOSIS — M25569 Pain in unspecified knee: Secondary | ICD-10-CM | POA: Diagnosis not present

## 2012-08-05 DIAGNOSIS — IMO0001 Reserved for inherently not codable concepts without codable children: Secondary | ICD-10-CM | POA: Diagnosis not present

## 2012-08-10 DIAGNOSIS — M25569 Pain in unspecified knee: Secondary | ICD-10-CM | POA: Diagnosis not present

## 2012-08-10 DIAGNOSIS — IMO0001 Reserved for inherently not codable concepts without codable children: Secondary | ICD-10-CM | POA: Diagnosis not present

## 2012-08-10 DIAGNOSIS — R262 Difficulty in walking, not elsewhere classified: Secondary | ICD-10-CM | POA: Diagnosis not present

## 2012-08-12 DIAGNOSIS — R262 Difficulty in walking, not elsewhere classified: Secondary | ICD-10-CM | POA: Diagnosis not present

## 2012-08-12 DIAGNOSIS — IMO0001 Reserved for inherently not codable concepts without codable children: Secondary | ICD-10-CM | POA: Diagnosis not present

## 2012-08-12 DIAGNOSIS — M25569 Pain in unspecified knee: Secondary | ICD-10-CM | POA: Diagnosis not present

## 2012-08-15 DIAGNOSIS — M25569 Pain in unspecified knee: Secondary | ICD-10-CM | POA: Diagnosis not present

## 2012-08-15 DIAGNOSIS — M6281 Muscle weakness (generalized): Secondary | ICD-10-CM | POA: Diagnosis not present

## 2012-08-15 DIAGNOSIS — IMO0001 Reserved for inherently not codable concepts without codable children: Secondary | ICD-10-CM | POA: Diagnosis not present

## 2012-08-15 DIAGNOSIS — R262 Difficulty in walking, not elsewhere classified: Secondary | ICD-10-CM | POA: Diagnosis not present

## 2012-08-17 DIAGNOSIS — IMO0001 Reserved for inherently not codable concepts without codable children: Secondary | ICD-10-CM | POA: Diagnosis not present

## 2012-08-17 DIAGNOSIS — M6281 Muscle weakness (generalized): Secondary | ICD-10-CM | POA: Diagnosis not present

## 2012-08-17 DIAGNOSIS — M25569 Pain in unspecified knee: Secondary | ICD-10-CM | POA: Diagnosis not present

## 2012-08-17 DIAGNOSIS — R262 Difficulty in walking, not elsewhere classified: Secondary | ICD-10-CM | POA: Diagnosis not present

## 2012-08-22 DIAGNOSIS — R262 Difficulty in walking, not elsewhere classified: Secondary | ICD-10-CM | POA: Diagnosis not present

## 2012-08-22 DIAGNOSIS — IMO0001 Reserved for inherently not codable concepts without codable children: Secondary | ICD-10-CM | POA: Diagnosis not present

## 2012-08-22 DIAGNOSIS — M6281 Muscle weakness (generalized): Secondary | ICD-10-CM | POA: Diagnosis not present

## 2012-08-22 DIAGNOSIS — M25569 Pain in unspecified knee: Secondary | ICD-10-CM | POA: Diagnosis not present

## 2012-08-23 DIAGNOSIS — J209 Acute bronchitis, unspecified: Secondary | ICD-10-CM | POA: Diagnosis not present

## 2012-08-26 DIAGNOSIS — R262 Difficulty in walking, not elsewhere classified: Secondary | ICD-10-CM | POA: Diagnosis not present

## 2012-08-26 DIAGNOSIS — M6281 Muscle weakness (generalized): Secondary | ICD-10-CM | POA: Diagnosis not present

## 2012-08-26 DIAGNOSIS — M25569 Pain in unspecified knee: Secondary | ICD-10-CM | POA: Diagnosis not present

## 2012-08-26 DIAGNOSIS — IMO0001 Reserved for inherently not codable concepts without codable children: Secondary | ICD-10-CM | POA: Diagnosis not present

## 2012-09-05 DIAGNOSIS — IMO0001 Reserved for inherently not codable concepts without codable children: Secondary | ICD-10-CM | POA: Diagnosis not present

## 2012-09-05 DIAGNOSIS — M6281 Muscle weakness (generalized): Secondary | ICD-10-CM | POA: Diagnosis not present

## 2012-09-05 DIAGNOSIS — R262 Difficulty in walking, not elsewhere classified: Secondary | ICD-10-CM | POA: Diagnosis not present

## 2012-09-05 DIAGNOSIS — M25569 Pain in unspecified knee: Secondary | ICD-10-CM | POA: Diagnosis not present

## 2012-09-07 DIAGNOSIS — R0989 Other specified symptoms and signs involving the circulatory and respiratory systems: Secondary | ICD-10-CM | POA: Diagnosis not present

## 2012-09-07 DIAGNOSIS — R059 Cough, unspecified: Secondary | ICD-10-CM | POA: Diagnosis not present

## 2012-09-09 DIAGNOSIS — M6281 Muscle weakness (generalized): Secondary | ICD-10-CM | POA: Diagnosis not present

## 2012-09-09 DIAGNOSIS — M25569 Pain in unspecified knee: Secondary | ICD-10-CM | POA: Diagnosis not present

## 2012-09-09 DIAGNOSIS — IMO0001 Reserved for inherently not codable concepts without codable children: Secondary | ICD-10-CM | POA: Diagnosis not present

## 2012-09-09 DIAGNOSIS — R262 Difficulty in walking, not elsewhere classified: Secondary | ICD-10-CM | POA: Diagnosis not present

## 2012-09-13 DIAGNOSIS — R262 Difficulty in walking, not elsewhere classified: Secondary | ICD-10-CM | POA: Diagnosis not present

## 2012-09-13 DIAGNOSIS — M25569 Pain in unspecified knee: Secondary | ICD-10-CM | POA: Diagnosis not present

## 2012-09-13 DIAGNOSIS — IMO0001 Reserved for inherently not codable concepts without codable children: Secondary | ICD-10-CM | POA: Diagnosis not present

## 2012-09-13 DIAGNOSIS — M6281 Muscle weakness (generalized): Secondary | ICD-10-CM | POA: Diagnosis not present

## 2012-09-19 DIAGNOSIS — J449 Chronic obstructive pulmonary disease, unspecified: Secondary | ICD-10-CM | POA: Diagnosis not present

## 2012-10-14 DIAGNOSIS — J449 Chronic obstructive pulmonary disease, unspecified: Secondary | ICD-10-CM | POA: Diagnosis not present

## 2012-11-11 DIAGNOSIS — Z1231 Encounter for screening mammogram for malignant neoplasm of breast: Secondary | ICD-10-CM | POA: Diagnosis not present

## 2012-11-15 DIAGNOSIS — Z96659 Presence of unspecified artificial knee joint: Secondary | ICD-10-CM | POA: Diagnosis not present

## 2012-11-15 DIAGNOSIS — Z471 Aftercare following joint replacement surgery: Secondary | ICD-10-CM | POA: Diagnosis not present

## 2012-12-01 ENCOUNTER — Encounter: Payer: Self-pay | Admitting: Internal Medicine

## 2012-12-01 ENCOUNTER — Ambulatory Visit (INDEPENDENT_AMBULATORY_CARE_PROVIDER_SITE_OTHER)
Admission: RE | Admit: 2012-12-01 | Discharge: 2012-12-01 | Disposition: A | Discharge status: Home / Self Care | Payer: Medicare Other | Source: Ambulatory Visit | Attending: Internal Medicine | Admitting: Internal Medicine

## 2012-12-01 ENCOUNTER — Ambulatory Visit (INDEPENDENT_AMBULATORY_CARE_PROVIDER_SITE_OTHER): Payer: Medicare Other | Admitting: Internal Medicine

## 2012-12-01 VITALS — BP 136/70 | HR 76 | Temp 98.5°F | Ht 63.0 in | Wt 169.4 lb

## 2012-12-01 DIAGNOSIS — J45909 Unspecified asthma, uncomplicated: Secondary | ICD-10-CM

## 2012-12-01 DIAGNOSIS — R0602 Shortness of breath: Secondary | ICD-10-CM

## 2012-12-01 DIAGNOSIS — Z23 Encounter for immunization: Secondary | ICD-10-CM

## 2012-12-01 DIAGNOSIS — R05 Cough: Secondary | ICD-10-CM

## 2012-12-01 MED ORDER — MOMETASONE FURO-FORMOTEROL FUM 100-5 MCG/ACT IN AERO: INHALATION_SPRAY | RESPIRATORY_TRACT | Status: DC

## 2012-12-01 NOTE — Assessment & Plan Note (Addendum)
DDX of  difficult airways managment all start with A and  include Adherence, Ace Inhibitors, Acid Reflux, Active Sinus Disease, Alpha 1 Antitripsin deficiency, Anxiety masquerading as Airways dz,  ABPA,  allergy(esp in young), Aspiration (esp in elderly), Adverse effects of DPI,  Active smokers, plus two Bs  = Bronchiectasis and Beta blocker use..and one C= CHF  Adherence is always the initial "prime suspect" and is a multilayered concern that requires a "trust but verify" approach in every patient - starting with knowing how to use medications, especially inhalers, correctly, keeping up with refills and understanding the fundamental difference between maintenance and prns vs those medications only taken for a very short course and then stopped and not refilled. The proper method of use, as well as anticipated side effects, of a metered-dose inhaler are discussed and demonstrated to the patient. Improved effectiveness after extensive coaching during this visit to a level of approximately  75% so start dulera 100 2bid with goal of eliminating symptoms and need for saba  ? Active sinus dz> consider sinus ct next  ? Acid (or non-acid) GERD > always difficult to exclude as up to 75% of pts in some series report no assoc GI/ Heartburn symptoms> rec   diet restrictions/ reviewed and instructions given in writting

## 2012-12-01 NOTE — Patient Instructions (Addendum)
dulera 100 Take 2 puffs first thing in am and then another 2 puffs about 12 hours later and work on better technique    Only use your albuterol (proaire)  as a rescue medication to be used if you can't catch your breath by resting or doing a relaxed purse lip breathing pattern. The less you use it, the better it will work when you need it. Ok to use up to every 4 hours but the goal is to not need it at all - don't leave it home without it.  GERD (REFLUX)  is an extremely common cause of respiratory symptoms, many times with no significant heartburn at all.    It can be treated with medication, but also with lifestyle changes including avoidance of late meals, excessive alcohol, smoking cessation, and avoid fatty foods, chocolate, peppermint, colas, red wine, and acidic juices such as orange juice.  NO MINT OR MENTHOL PRODUCTS SO NO COUGH DROPS  USE SUGARLESS CANDY INSTEAD (jolley ranchers or Stover's)  NO OIL BASED VITAMINS - use powdered substitutes.   Please remember to go to the  x-ray department downstairs for your tests - we will call you with the results when they are available.    Please schedule a follow up office visit in 4 weeks, sooner if needed with pfts  .

## 2012-12-01 NOTE — Assessment & Plan Note (Signed)
Followed in Pulmonary clinic/ [facility]/ Galan Ghee 

## 2012-12-01 NOTE — Progress Notes (Signed)
Subjective:    Patient ID: BRINN WESTBY, female    DOB: 1945/10/09   MRN: [id number]  HPI   67 yowf quit smoking April 2014 with some tendency to bronchitis and occ needed inhaler/short course previoiusly  but nothing chronic  then really sick in May 2014 with flu  Admit date: 07/11/2012  Discharge date: 07/14/2012  Time spent: > 35 minutes  Recommendations for Outpatient Follow-up:  1. Please be sure to follow up with your primary care physician in 1-2 weeks or sooner should any new concerns arise. Discharge Diagnoses:  Principal Problem:  SOB (shortness of breath)  Active Problems:  Cough  Tobacco abuse  HTN (hypertension)  Hypothyroidism  Syncope  Hyponatremia  Anemia  Discharge Condition: stable  Diet recommendation: Heart healthy  Filed Weights    07/12/12 0608  07/13/12 0527  07/14/12 0443   Weight:  72.485 kg (159 lb 12.8 oz)  72.2 kg (159 lb 2.8 oz)  72.258 kg (159 lb 4.8 oz)   History of present illness:  67 y/o with recent knee surgery last month, h/o HTN and hypothyroidism. Presenting with Influenza B.  Hospital Course:  1. Syncope - Most likely vaso vagal phenomenon from cough. Echocardiogram Showing an EF of 65-70 percent with grade I diastolic dysfuction 2. Influenza type B - started Tamiflu 4/29. Will provide script so patient can complete a 5 day total regimen of tamiflu. 3. Hyponatremia - improving with IVF and improved oral intake. Suspect due to low oral sodium solute intake. Should improved with continued improved oral intake. 4. Leukopenia ? Cause - maybe from viral illness and myeloid suppression - On last check trending up. 5. Recent knee replacement - Patient to continue physical therapy at home as indicated by her orthopaedic surgeon. 6.    12/01/2012 1st [facility] office visit/ Rane Blitch cc daily cough waxes wanes ever since May 2014 better by office visit but tends to worse at hs > typically clear sometimes green despite abx / pred/ prn saba  Qod  on avg may help some     No obvious daytime variabilty or assoc sob or cp or chest tightness, subjective wheeze overt sinus or hb symptoms. No unusual exp hx or h/o childhood pna/ asthma or knowledge of premature birth.    Also denies any obvious fluctuation of symptoms with weather or environmental changes or other aggravating or alleviating factors except as outlined above   Current Medications, Allergies, Complete Past Medical History, Past Surgical History, Family History, and Social History were reviewed in [employer] record.         Review of Systems  Constitutional: Negative for fever, chills and unexpected weight change.  HENT: Positive for sneezing and trouble swallowing. Negative for ear pain, nosebleeds, congestion, sore throat, rhinorrhea, dental problem, voice change, postnasal drip and sinus pressure.   Eyes: Negative for visual disturbance.  Respiratory: Positive for cough. Negative for choking and shortness of breath.   Cardiovascular: Negative for chest pain and leg swelling.  Gastrointestinal: Negative for vomiting, abdominal pain and diarrhea.  Genitourinary: Negative for difficulty urinating.  Musculoskeletal: Positive for arthralgias.  Skin: Negative for rash.  Neurological: Negative for tremors, syncope and headaches.  Hematological: Does not bruise/bleed easily.       Objective:   Physical Exam Wt Readings from Last 3 Encounters:  12/01/12 169 lb 6.4 oz (76.839 kg)  07/14/12 159 lb 4.8 oz (72.258 kg)  06/02/12 163 lb 12.8 oz (74.3 kg)    HEENT mild  turbinate edema.  Oropharynx top denture  no thrush or excess pnd or cobblestoning.  No JVD or cervical adenopathy. Mild accessory muscle hypertrophy. Trachea midline, nl thryroid. Chest was hyperinflated by percussion with diminished breath sounds and moderate increased exp time without wheeze. Hoover sign positive at mid inspiration. Regular rate and rhythm without murmur gallop or rub or  increase P2 or edema.  Abd: no hsm, nl excursion. Ext warm without cyanosis or clubbing.    CXR  12/01/2012 :  No active disease. Mild hyperinflation again noted.      Assessment & Plan:

## 2012-12-02 NOTE — Progress Notes (Signed)
Quick Note:  Spoke with pt and notified of results per Dr. Wert. Pt verbalized understanding and denied any questions.  ______ 

## 2012-12-15 DIAGNOSIS — M549 Dorsalgia, unspecified: Secondary | ICD-10-CM | POA: Diagnosis not present

## 2012-12-20 DIAGNOSIS — M25569 Pain in unspecified knee: Secondary | ICD-10-CM | POA: Diagnosis not present

## 2012-12-20 DIAGNOSIS — M545 Low back pain: Secondary | ICD-10-CM | POA: Diagnosis not present

## 2012-12-29 ENCOUNTER — Ambulatory Visit: Payer: Medicare Other | Admitting: Internal Medicine

## 2012-12-29 DIAGNOSIS — M47817 Spondylosis without myelopathy or radiculopathy, lumbosacral region: Secondary | ICD-10-CM | POA: Diagnosis not present

## 2013-01-03 DIAGNOSIS — M47817 Spondylosis without myelopathy or radiculopathy, lumbosacral region: Secondary | ICD-10-CM | POA: Diagnosis not present

## 2013-01-23 DIAGNOSIS — M546 Pain in thoracic spine: Secondary | ICD-10-CM | POA: Diagnosis not present

## 2013-01-23 DIAGNOSIS — M545 Low back pain: Secondary | ICD-10-CM | POA: Diagnosis not present

## 2013-01-25 ENCOUNTER — Other Ambulatory Visit: Payer: Self-pay | Admitting: Internal Medicine

## 2013-01-25 DIAGNOSIS — M549 Dorsalgia, unspecified: Secondary | ICD-10-CM

## 2013-01-25 DIAGNOSIS — M069 Rheumatoid arthritis, unspecified: Secondary | ICD-10-CM | POA: Diagnosis not present

## 2013-01-27 ENCOUNTER — Ambulatory Visit
Admission: RE | Admit: 2013-01-27 | Discharge: 2013-01-27 | Disposition: A | Discharge status: Home / Self Care | Payer: Medicare Other | Source: Ambulatory Visit | Attending: Internal Medicine | Admitting: Internal Medicine

## 2013-01-27 DIAGNOSIS — M549 Dorsalgia, unspecified: Secondary | ICD-10-CM

## 2013-01-27 DIAGNOSIS — IMO0002 Reserved for concepts with insufficient information to code with codable children: Secondary | ICD-10-CM | POA: Diagnosis not present

## 2013-01-27 MED ORDER — IOHEXOL 180 MG/ML  SOLN
1.0000 mL | Freq: Once | INTRAMUSCULAR | Status: AC | PRN
  Administered 2013-01-27: 1 mL via EPIDURAL

## 2013-01-27 MED ORDER — METHYLPREDNISOLONE ACETATE 40 MG/ML INJ SUSP (RADIOLOG
120.0000 mg | Freq: Once | INTRAMUSCULAR | Status: AC
  Administered 2013-01-27: 120 mg via EPIDURAL

## 2013-02-23 DIAGNOSIS — M47817 Spondylosis without myelopathy or radiculopathy, lumbosacral region: Secondary | ICD-10-CM | POA: Diagnosis not present

## 2013-02-23 DIAGNOSIS — M9981 Other biomechanical lesions of cervical region: Secondary | ICD-10-CM | POA: Diagnosis not present

## 2013-02-23 DIAGNOSIS — M5430 Sciatica, unspecified side: Secondary | ICD-10-CM | POA: Diagnosis not present

## 2013-02-23 DIAGNOSIS — M546 Pain in thoracic spine: Secondary | ICD-10-CM | POA: Diagnosis not present

## 2013-02-23 DIAGNOSIS — M4712 Other spondylosis with myelopathy, cervical region: Secondary | ICD-10-CM | POA: Diagnosis not present

## 2013-02-23 DIAGNOSIS — M999 Biomechanical lesion, unspecified: Secondary | ICD-10-CM | POA: Diagnosis not present

## 2013-03-20 DIAGNOSIS — J449 Chronic obstructive pulmonary disease, unspecified: Secondary | ICD-10-CM | POA: Diagnosis not present

## 2013-03-31 DIAGNOSIS — H18419 Arcus senilis, unspecified eye: Secondary | ICD-10-CM | POA: Diagnosis not present

## 2013-03-31 DIAGNOSIS — H02839 Dermatochalasis of unspecified eye, unspecified eyelid: Secondary | ICD-10-CM | POA: Diagnosis not present

## 2013-03-31 DIAGNOSIS — H25019 Cortical age-related cataract, unspecified eye: Secondary | ICD-10-CM | POA: Diagnosis not present

## 2013-03-31 DIAGNOSIS — H2510 Age-related nuclear cataract, unspecified eye: Secondary | ICD-10-CM | POA: Diagnosis not present

## 2013-03-31 DIAGNOSIS — H25049 Posterior subcapsular polar age-related cataract, unspecified eye: Secondary | ICD-10-CM | POA: Diagnosis not present

## 2013-05-08 DIAGNOSIS — H52209 Unspecified astigmatism, unspecified eye: Secondary | ICD-10-CM | POA: Diagnosis not present

## 2013-05-08 DIAGNOSIS — H2510 Age-related nuclear cataract, unspecified eye: Secondary | ICD-10-CM | POA: Diagnosis not present

## 2013-05-08 DIAGNOSIS — H269 Unspecified cataract: Secondary | ICD-10-CM | POA: Diagnosis not present

## 2013-05-09 DIAGNOSIS — H2510 Age-related nuclear cataract, unspecified eye: Secondary | ICD-10-CM | POA: Diagnosis not present

## 2013-05-16 DIAGNOSIS — H2510 Age-related nuclear cataract, unspecified eye: Secondary | ICD-10-CM | POA: Diagnosis not present

## 2013-05-25 DIAGNOSIS — R079 Chest pain, unspecified: Secondary | ICD-10-CM | POA: Diagnosis not present

## 2013-05-25 DIAGNOSIS — F17200 Nicotine dependence, unspecified, uncomplicated: Secondary | ICD-10-CM | POA: Diagnosis not present

## 2013-05-25 DIAGNOSIS — Z7982 Long term (current) use of aspirin: Secondary | ICD-10-CM | POA: Diagnosis not present

## 2013-05-25 DIAGNOSIS — G8929 Other chronic pain: Secondary | ICD-10-CM | POA: Diagnosis not present

## 2013-05-25 DIAGNOSIS — R0789 Other chest pain: Secondary | ICD-10-CM | POA: Diagnosis not present

## 2013-05-25 DIAGNOSIS — Z79899 Other long term (current) drug therapy: Secondary | ICD-10-CM | POA: Diagnosis not present

## 2013-05-25 DIAGNOSIS — J449 Chronic obstructive pulmonary disease, unspecified: Secondary | ICD-10-CM | POA: Diagnosis not present

## 2013-05-25 DIAGNOSIS — I10 Essential (primary) hypertension: Secondary | ICD-10-CM | POA: Diagnosis not present

## 2013-05-25 DIAGNOSIS — F3289 Other specified depressive episodes: Secondary | ICD-10-CM | POA: Diagnosis not present

## 2013-05-25 DIAGNOSIS — Z8249 Family history of ischemic heart disease and other diseases of the circulatory system: Secondary | ICD-10-CM | POA: Diagnosis not present

## 2013-05-25 DIAGNOSIS — Z780 Asymptomatic menopausal state: Secondary | ICD-10-CM | POA: Diagnosis not present

## 2013-05-25 DIAGNOSIS — M545 Low back pain, unspecified: Secondary | ICD-10-CM | POA: Diagnosis not present

## 2013-05-25 DIAGNOSIS — F329 Major depressive disorder, single episode, unspecified: Secondary | ICD-10-CM | POA: Diagnosis not present

## 2013-05-25 DIAGNOSIS — E039 Hypothyroidism, unspecified: Secondary | ICD-10-CM | POA: Diagnosis not present

## 2013-05-26 DIAGNOSIS — F329 Major depressive disorder, single episode, unspecified: Secondary | ICD-10-CM | POA: Diagnosis not present

## 2013-05-26 DIAGNOSIS — E039 Hypothyroidism, unspecified: Secondary | ICD-10-CM | POA: Diagnosis not present

## 2013-05-26 DIAGNOSIS — I10 Essential (primary) hypertension: Secondary | ICD-10-CM | POA: Diagnosis not present

## 2013-05-26 DIAGNOSIS — J449 Chronic obstructive pulmonary disease, unspecified: Secondary | ICD-10-CM | POA: Diagnosis not present

## 2013-05-26 DIAGNOSIS — F3289 Other specified depressive episodes: Secondary | ICD-10-CM | POA: Diagnosis not present

## 2013-05-26 DIAGNOSIS — R079 Chest pain, unspecified: Secondary | ICD-10-CM | POA: Diagnosis not present

## 2013-05-29 DIAGNOSIS — R079 Chest pain, unspecified: Secondary | ICD-10-CM | POA: Diagnosis not present

## 2013-06-01 DIAGNOSIS — I10 Essential (primary) hypertension: Secondary | ICD-10-CM | POA: Diagnosis not present

## 2013-06-01 DIAGNOSIS — Z0000 Encounter for general adult medical examination without abnormal findings: Secondary | ICD-10-CM | POA: Diagnosis not present

## 2013-06-01 DIAGNOSIS — R0789 Other chest pain: Secondary | ICD-10-CM | POA: Diagnosis not present

## 2013-06-02 DIAGNOSIS — R079 Chest pain, unspecified: Secondary | ICD-10-CM | POA: Diagnosis not present

## 2013-06-06 DIAGNOSIS — M25519 Pain in unspecified shoulder: Secondary | ICD-10-CM | POA: Diagnosis not present

## 2013-07-05 DIAGNOSIS — L819 Disorder of pigmentation, unspecified: Secondary | ICD-10-CM | POA: Diagnosis not present

## 2013-07-05 DIAGNOSIS — D1800 Hemangioma unspecified site: Secondary | ICD-10-CM | POA: Diagnosis not present

## 2013-07-05 DIAGNOSIS — L570 Actinic keratosis: Secondary | ICD-10-CM | POA: Diagnosis not present

## 2013-07-21 DIAGNOSIS — H269 Unspecified cataract: Secondary | ICD-10-CM | POA: Diagnosis not present

## 2013-07-21 DIAGNOSIS — H2510 Age-related nuclear cataract, unspecified eye: Secondary | ICD-10-CM | POA: Diagnosis not present

## 2013-07-21 DIAGNOSIS — H52209 Unspecified astigmatism, unspecified eye: Secondary | ICD-10-CM | POA: Diagnosis not present

## 2013-08-03 DIAGNOSIS — M76899 Other specified enthesopathies of unspecified lower limb, excluding foot: Secondary | ICD-10-CM | POA: Diagnosis not present

## 2013-08-03 DIAGNOSIS — M549 Dorsalgia, unspecified: Secondary | ICD-10-CM | POA: Diagnosis not present

## 2013-08-11 DIAGNOSIS — M1710 Unilateral primary osteoarthritis, unspecified knee: Secondary | ICD-10-CM | POA: Diagnosis not present

## 2013-08-16 DIAGNOSIS — Z1211 Encounter for screening for malignant neoplasm of colon: Secondary | ICD-10-CM | POA: Diagnosis not present

## 2013-08-18 DIAGNOSIS — M542 Cervicalgia: Secondary | ICD-10-CM | POA: Diagnosis not present

## 2013-08-18 DIAGNOSIS — M48062 Spinal stenosis, lumbar region with neurogenic claudication: Secondary | ICD-10-CM | POA: Diagnosis not present

## 2013-08-18 DIAGNOSIS — M545 Low back pain, unspecified: Secondary | ICD-10-CM | POA: Diagnosis not present

## 2013-08-18 DIAGNOSIS — M4802 Spinal stenosis, cervical region: Secondary | ICD-10-CM | POA: Diagnosis not present

## 2013-08-23 DIAGNOSIS — J209 Acute bronchitis, unspecified: Secondary | ICD-10-CM | POA: Diagnosis not present

## 2013-09-05 DIAGNOSIS — M5137 Other intervertebral disc degeneration, lumbosacral region: Secondary | ICD-10-CM | POA: Diagnosis not present

## 2013-09-05 DIAGNOSIS — M5030 Other cervical disc degeneration, unspecified cervical region: Secondary | ICD-10-CM | POA: Diagnosis not present

## 2013-09-22 DIAGNOSIS — M5030 Other cervical disc degeneration, unspecified cervical region: Secondary | ICD-10-CM | POA: Diagnosis not present

## 2013-09-30 DIAGNOSIS — F341 Dysthymic disorder: Secondary | ICD-10-CM | POA: Diagnosis not present

## 2013-09-30 DIAGNOSIS — Z7982 Long term (current) use of aspirin: Secondary | ICD-10-CM | POA: Diagnosis not present

## 2013-09-30 DIAGNOSIS — Z780 Asymptomatic menopausal state: Secondary | ICD-10-CM | POA: Diagnosis not present

## 2013-09-30 DIAGNOSIS — R079 Chest pain, unspecified: Secondary | ICD-10-CM | POA: Diagnosis not present

## 2013-09-30 DIAGNOSIS — M545 Low back pain, unspecified: Secondary | ICD-10-CM | POA: Diagnosis not present

## 2013-09-30 DIAGNOSIS — G8929 Other chronic pain: Secondary | ICD-10-CM | POA: Diagnosis not present

## 2013-09-30 DIAGNOSIS — M549 Dorsalgia, unspecified: Secondary | ICD-10-CM | POA: Diagnosis not present

## 2013-09-30 DIAGNOSIS — Z79899 Other long term (current) drug therapy: Secondary | ICD-10-CM | POA: Diagnosis not present

## 2013-09-30 DIAGNOSIS — Z87891 Personal history of nicotine dependence: Secondary | ICD-10-CM | POA: Diagnosis not present

## 2013-09-30 DIAGNOSIS — Z96659 Presence of unspecified artificial knee joint: Secondary | ICD-10-CM | POA: Diagnosis not present

## 2013-09-30 DIAGNOSIS — J449 Chronic obstructive pulmonary disease, unspecified: Secondary | ICD-10-CM | POA: Diagnosis not present

## 2013-09-30 DIAGNOSIS — E039 Hypothyroidism, unspecified: Secondary | ICD-10-CM | POA: Diagnosis not present

## 2013-09-30 DIAGNOSIS — I209 Angina pectoris, unspecified: Secondary | ICD-10-CM | POA: Diagnosis not present

## 2013-09-30 DIAGNOSIS — I10 Essential (primary) hypertension: Secondary | ICD-10-CM | POA: Diagnosis not present

## 2013-10-01 ENCOUNTER — Encounter (HOSPITAL_COMMUNITY): Payer: Self-pay | Admitting: Internal Medicine

## 2013-10-01 ENCOUNTER — Observation Stay (HOSPITAL_COMMUNITY)
Admission: RE | Admit: 2013-10-01 | Discharge: 2013-10-02 | Disposition: A | Discharge status: Home / Self Care | Payer: Medicare Other | Source: Other Acute Inpatient Hospital | Attending: Internal Medicine | Admitting: Internal Medicine

## 2013-10-01 DIAGNOSIS — F3289 Other specified depressive episodes: Secondary | ICD-10-CM | POA: Diagnosis not present

## 2013-10-01 DIAGNOSIS — Z720 Tobacco use: Secondary | ICD-10-CM

## 2013-10-01 DIAGNOSIS — I10 Essential (primary) hypertension: Secondary | ICD-10-CM | POA: Diagnosis not present

## 2013-10-01 DIAGNOSIS — F329 Major depressive disorder, single episode, unspecified: Secondary | ICD-10-CM | POA: Diagnosis not present

## 2013-10-01 DIAGNOSIS — K219 Gastro-esophageal reflux disease without esophagitis: Secondary | ICD-10-CM | POA: Diagnosis not present

## 2013-10-01 DIAGNOSIS — F341 Dysthymic disorder: Secondary | ICD-10-CM | POA: Diagnosis not present

## 2013-10-01 DIAGNOSIS — D649 Anemia, unspecified: Secondary | ICD-10-CM | POA: Diagnosis present

## 2013-10-01 DIAGNOSIS — IMO0001 Reserved for inherently not codable concepts without codable children: Secondary | ICD-10-CM | POA: Diagnosis not present

## 2013-10-01 DIAGNOSIS — J45909 Unspecified asthma, uncomplicated: Secondary | ICD-10-CM | POA: Insufficient documentation

## 2013-10-01 DIAGNOSIS — Z7982 Long term (current) use of aspirin: Secondary | ICD-10-CM | POA: Insufficient documentation

## 2013-10-01 DIAGNOSIS — Z87891 Personal history of nicotine dependence: Secondary | ICD-10-CM | POA: Diagnosis not present

## 2013-10-01 DIAGNOSIS — R079 Chest pain, unspecified: Principal | ICD-10-CM | POA: Diagnosis present

## 2013-10-01 DIAGNOSIS — I209 Angina pectoris, unspecified: Secondary | ICD-10-CM | POA: Diagnosis not present

## 2013-10-01 DIAGNOSIS — R072 Precordial pain: Secondary | ICD-10-CM

## 2013-10-01 DIAGNOSIS — E039 Hypothyroidism, unspecified: Secondary | ICD-10-CM | POA: Diagnosis present

## 2013-10-01 NOTE — H&P (Addendum)
Alexis Ortega is an 68 y.o. female.     Chief Complaint: chest pain Primary cardiologist: new  HPI: Alexis Ortega is a 68 yo woman with PMH of hypertension, hypothyroidism, GERD, depression and knee/back pain/prior surgeries who presented to [facility] Saturday evening with 10/10 chest pain that started at rest in her recliner. She characterized the pain as "it hurt" - not necessarily sharp or pressure with radiation to her back. The pain gradually resolved but did not improved with SL NTG x3. She also had some morphine but not prompt relief. At [facility] she had negative troponins, nonischemic ECG but given the severity of her pain and her upcoming pre-op for Spine/Neck Surgery this Wednesday with surgery the following Wednesday she requested transfer to [facility] for further evaluation and consideration of LHC at urging of patient and Dr. Dagoberto Ligas. During my history/exam with her and her daughter who is an RT at [facility] she told me she had a negative nuclear stress in March and a heart catheterization at least 7 years ago related to RUQ pain (gallbladder issues) both of which were negative. Her stress nuclear in March was "routine" according to Alexis Ortega because she has not ever had chest pain before. She is a former smoker but has no known family history of overt CAD. She has a cough/cold and is being treated with steroids orally.   Past Medical History  Diagnosis Date  . Hypertension   . Thyroid disease   . Hypothyroidism   . Depression   . GERD (gastroesophageal reflux disease)   . Arthritis   . Fibromyalgia   . Asthma     Past Surgical History  Procedure Laterality Date  . Back surgery    . Abdominal hysterectomy    . Cholecystectomy    . Tubal ligation    . Knee arthroscopy Left   . Carpal tunnel release Right   . Total knee arthroplasty Left 06/01/2012    Procedure: LEFT TOTAL KNEE ARTHROPLASTY;  Surgeon: Ninetta Lights, MD;  Location: [place];  Service: Orthopedics;   Laterality: Left;    Family History  Problem Relation Age of Onset  . Allergies Son   . Asthma Daughter    Social History:  reports that she quit smoking about 15 months ago. Her smoking use included Cigarettes. She has a 40 pack-year smoking history. She has never used smokeless tobacco. She reports that she does not drink alcohol or use illicit drugs.  Allergies:  Allergies  Allergen Reactions  . Tramadol Itching    Medications Prior to Admission  Medication Sig Dispense Refill  . ALPRAZolam (XANAX) 0.5 MG tablet Take 0.5 mg by mouth at bedtime as needed for sleep.      Marland Kitchen amLODipine (NORVASC) 5 MG tablet Take 1 tablet (5 mg total) by mouth daily.  30 tablet  0  . aspirin EC 81 MG tablet Take 81 mg by mouth daily.      . benzonatate (TESSALON) 100 MG capsule Take 100 mg by mouth 3 (three) times daily as needed for cough.      Marland Kitchen CALCIUM PO Take 1 tablet by mouth daily.      . citalopram (CELEXA) 20 MG tablet Take 20 mg by mouth daily.      Marland Kitchen levothyroxine (SYNTHROID, LEVOTHROID) 88 MCG tablet Take 88 mcg by mouth daily before breakfast.      . losartan (COZAAR) 100 MG tablet Take 100 mg by mouth daily.      Marland Kitchen  mometasone-formoterol (DULERA) 100-5 MCG/ACT AERO Take 2 puffs first thing in am and then another 2 puffs about 12 hours later.    0  . oxyCODONE-acetaminophen (PERCOCET/ROXICET) 5-325 MG per tablet Take 1-2 tablets by mouth every 4 (four) hours as needed for pain.  60 tablet  0    No results found for this or any previous visit (from the past 48 hour(s)). No results found.  Review of Systems  Constitutional: Negative for fever and chills.  HENT: Negative for ear discharge.   Eyes: Negative for double vision.  Respiratory: Positive for cough and sputum production. Negative for hemoptysis and wheezing.   Cardiovascular: Positive for chest pain. Negative for palpitations, orthopnea and leg swelling.  Gastrointestinal: Negative for vomiting, abdominal pain and diarrhea.    Genitourinary: Negative for dysuria and frequency.  Musculoskeletal: Positive for back pain and neck pain. Negative for myalgias.  Skin: Negative for rash.  Neurological: Negative for dizziness, sensory change and headaches.  Endo/Heme/Allergies: Negative for polydipsia. Does not bruise/bleed easily.  Psychiatric/Behavioral: Negative for suicidal ideas, hallucinations and substance abuse.    Blood pressure 144/64, pulse 71, temperature 98.2 F (36.8 C), temperature source Oral, resp. rate 18, height 5\' 5"  (1.651 m), weight 73.347 kg (161 lb 11.2 oz), SpO2 100.00%. Physical Exam  Nursing note and vitals reviewed. Constitutional: She is oriented to person, place, and time. She appears well-developed and well-nourished. No distress.  HENT:  Head: Normocephalic and atraumatic.  Nose: Nose normal.  Mouth/Throat: Oropharynx is clear and moist. No oropharyngeal exudate.  Eyes: Conjunctivae and EOM are normal. Pupils are equal, round, and reactive to light. No scleral icterus.  Neck: Normal range of motion. Neck supple. No JVD present. No tracheal deviation present.  Cardiovascular: Normal rate, regular rhythm and intact distal pulses.  Exam reveals no gallop.   No murmur heard. Respiratory: Effort normal and breath sounds normal. No respiratory distress. She has no wheezes. She has no rales.  GI: Soft. Bowel sounds are normal. She exhibits no distension. There is no tenderness. There is no rebound.  Musculoskeletal: Normal range of motion. She exhibits no edema and no tenderness.  Neurological: She is alert and oriented to person, place, and time. No cranial nerve deficit. Coordination normal.  Skin: Skin is warm and dry. No rash noted. She is not diaphoretic. No erythema.  Psychiatric: She has a normal mood and affect. Thought content normal.   labs from [facility] reviewed; Trop I <0.1 x3, wbc 15.1, h/h 12.5/37.1, plt 289, cr 0.77, bun/15, na 136, K 4.2 ECG: NSR, no ischemic  changes Medications reviewed;  Problem List Chest Pain Spine/neck issues requiring surgery  Hypertension Hypothyroidism Mild Anemia Prior Tobacco abuse    Assessment/Plan Alexis Ortega is a 69 yo woman with PMh of hypothyroidism, hypertension, prior tobacco abuse, no known family history who presented to ER and transferred to [facility] for chest pain. She is chest pain free. She had negative troponins and nonischemic ECG with NSR. She has no diabetes, is not on insulin and has no chest pain with exertion as well as having a negative stress nuclear in march 2015. She has no known ischemic heart disease, no heart failure, no prior VA, normal Serum creatinine <1 and no diabetes/insulin use. At this point in my history, she has no risk factors and an RCRI risk of 0.4% for major cardiac complications such as cardiac death, nonfatal MI, nonfatal cardiac arrest, postoperative  Cardiogenic shock or complete heart block of 0.4%. She is low  cardiac risk for a moderate to high risk spine procedure. I will update our ECG and labs to confirm initial thoughts. Watch on telemetry and have our team review to confirm initial thoughts. I discussed my thoughts with Alexis Ortega and her daughter. We will obtain labs in AM, repeat ECG in AM.  - update labs, ecg  - NPO after MN just in case further testing needed - continue home medications  Alexis Ortega 10/01/2013, 11:43 PM

## 2013-10-02 ENCOUNTER — Encounter (HOSPITAL_COMMUNITY)
Admission: RE | Disposition: A | Discharge status: Home / Self Care | Payer: Medicare Other | Source: Other Acute Inpatient Hospital | Attending: Internal Medicine

## 2013-10-02 DIAGNOSIS — F3289 Other specified depressive episodes: Secondary | ICD-10-CM | POA: Diagnosis not present

## 2013-10-02 DIAGNOSIS — R079 Chest pain, unspecified: Secondary | ICD-10-CM | POA: Diagnosis not present

## 2013-10-02 DIAGNOSIS — IMO0001 Reserved for inherently not codable concepts without codable children: Secondary | ICD-10-CM | POA: Diagnosis not present

## 2013-10-02 DIAGNOSIS — I10 Essential (primary) hypertension: Secondary | ICD-10-CM | POA: Diagnosis not present

## 2013-10-02 DIAGNOSIS — D649 Anemia, unspecified: Secondary | ICD-10-CM | POA: Diagnosis not present

## 2013-10-02 DIAGNOSIS — R072 Precordial pain: Secondary | ICD-10-CM

## 2013-10-02 DIAGNOSIS — F329 Major depressive disorder, single episode, unspecified: Secondary | ICD-10-CM | POA: Diagnosis not present

## 2013-10-02 DIAGNOSIS — E039 Hypothyroidism, unspecified: Secondary | ICD-10-CM | POA: Diagnosis not present

## 2013-10-02 HISTORY — PX: LEFT HEART CATHETERIZATION WITH CORONARY ANGIOGRAM: SHX5451

## 2013-10-02 LAB — PROTIME-INR
INR: 1.05 (ref 0.00–1.49)
Prothrombin Time: 13.7 seconds (ref 11.6–15.2)

## 2013-10-02 LAB — TROPONIN I

## 2013-10-02 LAB — LIPID PANEL
Cholesterol: 207 mg/dL — ABNORMAL HIGH (ref 0–200)
HDL: 74 mg/dL (ref >39)
LDL CALC: 115 mg/dL — AB (ref 0–99)
TRIGLYCERIDES: 89 mg/dL (ref <150)
Total CHOL/HDL Ratio: 2.8 RATIO
VLDL: 18 mg/dL (ref 0–40)

## 2013-10-02 LAB — CBC
HCT: 35 % — ABNORMAL LOW (ref 36.0–46.0)
Hemoglobin: 11.6 g/dL — ABNORMAL LOW (ref 12.0–15.0)
MCH: 31.1 pg (ref 26.0–34.0)
MCHC: 33.1 g/dL (ref 30.0–36.0)
MCV: 93.8 fL (ref 78.0–100.0)
Platelets: 315 10*3/uL (ref 150–400)
RBC: 3.73 MIL/uL — ABNORMAL LOW (ref 3.87–5.11)
RDW: 13.3 % (ref 11.5–15.5)
WBC: 13.9 10*3/uL — AB (ref 4.0–10.5)

## 2013-10-02 LAB — BASIC METABOLIC PANEL
Anion gap: 10 (ref 5–15)
BUN: 17 mg/dL (ref 6–23)
CO2: 30 meq/L (ref 19–32)
CREATININE: 0.74 mg/dL (ref 0.50–1.10)
Calcium: 9 mg/dL (ref 8.4–10.5)
Chloride: 99 mEq/L (ref 96–112)
GFR calc Af Amer: >90 mL/min (ref >90)
GFR, EST NON AFRICAN AMERICAN: 86 mL/min — AB (ref >90)
GLUCOSE: 81 mg/dL (ref 70–99)
Potassium: 4.3 mEq/L (ref 3.7–5.3)
Sodium: 139 mEq/L (ref 137–147)

## 2013-10-02 LAB — CREATININE, SERUM
Creatinine, Ser: 0.69 mg/dL (ref 0.50–1.10)
GFR calc non Af Amer: 88 mL/min — ABNORMAL LOW (ref >90)

## 2013-10-02 SURGERY — LEFT HEART CATHETERIZATION WITH CORONARY ANGIOGRAM
Anesthesia: LOCAL

## 2013-10-02 MED ORDER — AMLODIPINE BESYLATE 5 MG PO TABS
5.0000 mg | ORAL_TABLET | Freq: Every day | ORAL | Status: DC
  Administered 2013-10-02: 5 mg via ORAL
  Filled 2013-10-02: qty 1

## 2013-10-02 MED ORDER — SODIUM CHLORIDE 0.9 % IV SOLN
1.0000 mL/kg/h | INTRAVENOUS | Status: DC
  Administered 2013-10-02: 1 mL/kg/h via INTRAVENOUS

## 2013-10-02 MED ORDER — ONDANSETRON HCL 4 MG/2ML IJ SOLN: 4.0000 mg | Freq: Four times a day (QID) | INTRAMUSCULAR | Status: DC | PRN

## 2013-10-02 MED ORDER — HEPARIN (PORCINE) IN NACL 2-0.9 UNIT/ML-% IJ SOLN
INTRAMUSCULAR | Status: AC
  Filled 2013-10-02: qty 1500

## 2013-10-02 MED ORDER — SODIUM CHLORIDE 0.9 % IJ SOLN
3.0000 mL | INTRAMUSCULAR | Status: DC | PRN
Start: 2013-10-02 — End: 2013-10-02

## 2013-10-02 MED ORDER — ACETAMINOPHEN 325 MG PO TABS
650.0000 mg | ORAL_TABLET | ORAL | Status: DC | PRN
Start: 2013-10-02 — End: 2013-10-02

## 2013-10-02 MED ORDER — HEPARIN SODIUM (PORCINE) 5000 UNIT/ML IJ SOLN
5000.0000 [IU] | Freq: Three times a day (TID) | INTRAMUSCULAR | Status: DC
  Administered 2013-10-02 (×2): 5000 [IU] via SUBCUTANEOUS
  Filled 2013-10-02 (×4): qty 1

## 2013-10-02 MED ORDER — LOSARTAN POTASSIUM 50 MG PO TABS
100.0000 mg | ORAL_TABLET | Freq: Every day | ORAL | Status: DC
Start: 2013-10-02 — End: 2013-10-02
  Administered 2013-10-02: 100 mg via ORAL
  Filled 2013-10-02: qty 2

## 2013-10-02 MED ORDER — OXYCODONE-ACETAMINOPHEN 5-325 MG PO TABS
1.0000 | ORAL_TABLET | ORAL | Status: DC | PRN
Start: 2013-10-01 — End: 2013-10-02

## 2013-10-02 MED ORDER — SODIUM CHLORIDE 0.9 % IJ SOLN: 3.0000 mL | Freq: Two times a day (BID) | INTRAMUSCULAR | Status: DC

## 2013-10-02 MED ORDER — FENTANYL 25 MCG/HR TD PT72: 25.0000 ug | MEDICATED_PATCH | TRANSDERMAL | Status: DC

## 2013-10-02 MED ORDER — UMECLIDINIUM-VILANTEROL 62.5-25 MCG/INH IN AEPB: 1.0000 | INHALATION_SPRAY | Freq: Every day | RESPIRATORY_TRACT | Status: DC

## 2013-10-02 MED ORDER — METHYLPREDNISOLONE (PAK) 4 MG PO TABS: 4.0000 mg | ORAL_TABLET | ORAL | Status: DC

## 2013-10-02 MED ORDER — NICOTINE 21 MG/24HR TD PT24: 21.0000 mg | MEDICATED_PATCH | Freq: Every day | TRANSDERMAL | Status: DC

## 2013-10-02 MED ORDER — ALPRAZOLAM 0.5 MG PO TABS
0.5000 mg | ORAL_TABLET | Freq: Every evening | ORAL | Status: DC | PRN
  Administered 2013-10-02: 0.5 mg via ORAL
  Filled 2013-10-02: qty 1

## 2013-10-02 MED ORDER — SODIUM CHLORIDE 0.9 % IV SOLN: 250.0000 mL | INTRAVENOUS | Status: DC | PRN

## 2013-10-02 MED ORDER — ALBUTEROL SULFATE (2.5 MG/3ML) 0.083% IN NEBU: 2.5000 mg | INHALATION_SOLUTION | RESPIRATORY_TRACT | Status: DC | PRN

## 2013-10-02 MED ORDER — ATORVASTATIN CALCIUM 40 MG PO TABS
40.0000 mg | ORAL_TABLET | Freq: Every day | ORAL | Status: DC
  Administered 2013-10-02: 40 mg via ORAL
  Filled 2013-10-02: qty 1

## 2013-10-02 MED ORDER — FENTANYL 25 MCG/HR TD PT72
25.0000 ug | MEDICATED_PATCH | TRANSDERMAL | Status: DC
  Administered 2013-10-02: 25 ug via TRANSDERMAL
  Filled 2013-10-02: qty 1

## 2013-10-02 MED ORDER — FENTANYL CITRATE 0.05 MG/ML IJ SOLN
INTRAMUSCULAR | Status: AC
  Filled 2013-10-02: qty 2

## 2013-10-02 MED ORDER — LEVOTHYROXINE SODIUM 88 MCG PO TABS
88.0000 ug | ORAL_TABLET | Freq: Every day | ORAL | Status: DC
  Administered 2013-10-02: 88 ug via ORAL
  Filled 2013-10-02 (×2): qty 1

## 2013-10-02 MED ORDER — CITALOPRAM HYDROBROMIDE 20 MG PO TABS
20.0000 mg | ORAL_TABLET | Freq: Every day | ORAL | Status: DC
  Administered 2013-10-02: 20 mg via ORAL
  Filled 2013-10-02: qty 1

## 2013-10-02 MED ORDER — LIDOCAINE HCL (PF) 1 % IJ SOLN
INTRAMUSCULAR | Status: AC
  Filled 2013-10-02: qty 30

## 2013-10-02 MED ORDER — SODIUM CHLORIDE 0.9 % IV SOLN: INTRAVENOUS | Status: AC

## 2013-10-02 MED ORDER — ACETAMINOPHEN 325 MG PO TABS: 650.0000 mg | ORAL_TABLET | ORAL | Status: DC | PRN

## 2013-10-02 MED ORDER — ALBUTEROL SULFATE HFA 108 (90 BASE) MCG/ACT IN AERS: 2.0000 | INHALATION_SPRAY | RESPIRATORY_TRACT | Status: DC | PRN

## 2013-10-02 MED ORDER — ASPIRIN EC 81 MG PO TBEC
81.0000 mg | DELAYED_RELEASE_TABLET | Freq: Every day | ORAL | Status: DC
  Administered 2013-10-02: 81 mg via ORAL
  Filled 2013-10-02: qty 1

## 2013-10-02 MED ORDER — NITROGLYCERIN 0.2 MG/ML ON CALL CATH LAB
INTRAVENOUS | Status: AC
  Filled 2013-10-02: qty 1

## 2013-10-02 MED ORDER — MIDAZOLAM HCL 2 MG/2ML IJ SOLN
INTRAMUSCULAR | Status: AC
  Filled 2013-10-02: qty 2

## 2013-10-02 NOTE — Progress Notes (Signed)
TELEMETRY: Reviewed telemetry pt in NSR with rare PVC: Filed Vitals:   10/01/13 2217 10/02/13 0034 10/02/13 0400  BP: 144/64 128/65 117/52  Pulse: 71 64 67  Temp: 98.2 F (36.8 C) 98 F (36.7 C) 98.1 F (36.7 C)  TempSrc: Oral Oral Oral  Resp: 18  18  Height: 5\' 5"  (1.651 m)    Weight: 161 lb 11.2 oz (73.347 kg)  161 lb 11.2 oz (73.347 kg)  SpO2: 100% 95% 96%    Intake/Output Summary (Last 24 hours) at 10/02/13 0754 Last data filed at 10/02/13 0450  Gross per 24 hour  Intake      0 ml  Output   1100 ml  Net  -1100 ml   Filed Weights   10/01/13 2217 10/02/13 0400  Weight: 161 lb 11.2 oz (73.347 kg) 161 lb 11.2 oz (73.347 kg)    Subjective Denies chest pain or SOB  . amLODipine  5 mg Oral Daily  . aspirin EC  81 mg Oral Daily  . citalopram  20 mg Oral Daily  . fentaNYL  25 mcg Transdermal Q72H  . heparin  5,000 Units Subcutaneous 3 times per day  . levothyroxine  88 mcg Oral QAC breakfast  . losartan  100 mg Oral Daily      LABS: Basic Metabolic Panel:  Recent Labs  10/02/13 0039  CREATININE 0.69   Liver Function Tests: No results found for this basename: AST, ALT, ALKPHOS, BILITOT, PROT, ALBUMIN,  in the last 72 hours No results found for this basename: LIPASE, AMYLASE,  in the last 72 hours CBC:  Recent Labs  10/02/13 0039  WBC 13.9*  HGB 11.6*  HCT 35.0*  MCV 93.8  PLT 315   Cardiac Enzymes: No results found for this basename: CKTOTAL, CKMB, CKMBINDEX, TROPONINI,  in the last 72 hours BNP: No results found for this basename: PROBNP,  in the last 72 hours D-Dimer: No results found for this basename: DDIMER,  in the last 72 hours Hemoglobin A1C: No results found for this basename: HGBA1C,  in the last 72 hours Fasting Lipid Panel: No results found for this basename: CHOL, HDL, LDLCALC, TRIG, CHOLHDL, LDLDIRECT,  in the last 72 hours Thyroid Function Tests: No results found for this basename: TSH, T4TOTAL, FREET3, T3FREE, THYROIDAB,  in the  last 72 hours   Radiology/Studies:  No results found.  ECG: NSR, normal  PHYSICAL EXAM General: Well developed, well nourished, in no acute distress. Head: Normocephalic, atraumatic, sclera non-icteric, oropharynx is clear Neck: Negative for carotid bruits. JVD not elevated. No adenopathy Lungs: Clear bilaterally to auscultation without wheezes, rales, or rhonchi. Breathing is unlabored. Heart: RRR S1 S2 without murmurs, rubs, or gallops.  Abdomen: Soft, non-tender, non-distended with normoactive bowel sounds. No hepatomegaly. No rebound/guarding. No obvious abdominal masses. Msk:  Strength and tone appears normal for age. Extremities: No clubbing, cyanosis or edema.  Distal pedal pulses are 2+ and equal bilaterally. Neuro: Alert and oriented X 3. Moves all extremities spontaneously. Psych:  Responds to questions appropriately with a normal affect.  ASSESSMENT AND PLAN:  1. Prolonged chest pain. Transferred from [place] out for MI. Currently pain free. Etiology of pain is unclear. She is s/p cholecystectomy. She does have GERD but that pain is different. She had a negative cath > 7 years ago. She had a normal [place] in [place] on 06/02/13. She did have a CT of the chest in April 2014 that showed extensive aortic and coronary calcification. She  is planning on having cervical spine surgery next week at [facility]. Given the above factors I have recommended proceeding with cardiac cath today. The procedure and risks were reviewed including but not limited to death, myocardial infarction, stroke, arrythmias, bleeding, transfusion, emergency surgery, dye allergy, or renal dysfunction. The patient voices understanding and is agreeable to proceed. If negative for obstructive disease she can be DC later today and proceed with surgery next week. If she needs PCI she will need to delay spine surgery.  2. HTN controlled on amlodipine and losartan.   3. Tobacco abuse. States she quit  last week. On nicotine patch.   4. Lipid status is unknown. Will check lipid panel. Given coronary and aortic calcification I would  Recommend she take a statin.   Present on Admission:  . Chest pain . HTN (hypertension) . Hypothyroidism . Anemia  Signed, Lorrain Rivers [place], [place] 10/02/2013 7:54 AM

## 2013-10-02 NOTE — CV Procedure (Signed)
Alexis Ortega is a 68 y.o. female    [id number] LOCATION:  FACILITY: [facility]  PHYSICIAN: Quay Burow, M.D. 07-29-1945   DATE OF PROCEDURE:  10/02/2013  DATE OF DISCHARGE:     CARDIAC CATHETERIZATION     History obtained from chart review.[name] is a 68 year old female with positive cardiac risk factors admitted with chest pain. She ruled out for myocardial infarction. A CT scan of the chest did show coronary calcification. She scheduled for back surgery a week from today. Because of this, it was decided to proceed with diagnostic coronary arteriography to define her anatomy and to risk stratify her.   PROCEDURE DESCRIPTION:   The patient was brought to the second floor [facility] cath lab in the postabsorptive state. She was premedicated with Valium 5 mg by mouth, IV Versed and fentanyl.. Her right groin was prepped and shaved in usual sterile fashion. Xylocaine 1% was used for local anesthesia. A 5 French sheath was inserted into the right common femoral  artery using standard Seldinger technique. 5 French right and left Judkins diagnostic catheters along with a 5 French pigtail catheter were used for selective coronary angiography and left ventriculography respectively. Omnipaque dye was used for the entirety of the case . Retrograde aortic, left ventricular and pullback pressures were recorded.  HEMODYNAMICS:    AO SYSTOLIC/AO DIASTOLIC: 834/19   LV SYSTOLIC/LV DIASTOLIC: 622/29  ANGIOGRAPHIC RESULTS:   1. Left main; normal  2. LAD; normal 3. Left circumflex; normal.  4. Right coronary artery; normal, dominant   5. Left ventriculography; RAO left ventriculogram was performed using  25 mL of Visipaque dye at 12 mL/second. The overall LVEF estimated  60 %  Without wall motion abnormalities  IMPRESSION:[name] has normal coronary arteries and normal left ventricular function. I believe her chest pain was noncardiac. Total contrast administered the patient  was 55 cc. A right femoral anterior was performed and a MYNX closure device was used to obtain hemostasis. The patient left the lab in stable condition. She'll be discharged on 2 hours and cleared for her upcoming back surgery at low cardiovascular risk.  Lorretta Harp MD, [facility] 10/02/2013 4:54 PM

## 2013-10-02 NOTE — Interval H&P Note (Signed)
Cath Lab Visit (complete for each Cath Lab visit)  Clinical Evaluation Leading to the Procedure:   ACS: No.  Non-ACS:    Anginal Classification: CCS III  Anti-ischemic medical therapy: Minimal Therapy (1 class of medications)  Non-Invasive Test Results: Low-risk stress test findings: cardiac mortality <1%/year  Prior CABG: No previous CABG      History and Physical Interval Note:  10/02/2013 4:17 PM  Alexis Ortega  has presented today for surgery, with the diagnosis of cp  The various methods of treatment have been discussed with the patient and family. After consideration of risks, benefits and other options for treatment, the patient has consented to  Procedure(s): LEFT HEART CATHETERIZATION WITH CORONARY ANGIOGRAM (N/A) as a surgical intervention .  The patient's history has been reviewed, patient examined, no change in status, stable for surgery.  I have reviewed the patient's chart and labs.  Questions were answered to the patient's satisfaction.     Lorretta Harp

## 2013-10-02 NOTE — H&P (View-Only) (Signed)
TELEMETRY: Reviewed telemetry pt in NSR with rare PVC: Filed Vitals:   10/01/13 2217 10/02/13 0034 10/02/13 0400  BP: 144/64 128/65 117/52  Pulse: 71 64 67  Temp: 98.2 F (36.8 C) 98 F (36.7 C) 98.1 F (36.7 C)  TempSrc: Oral Oral Oral  Resp: 18  18  Height: 5\' 5"  (1.651 m)    Weight: 161 lb 11.2 oz (73.347 kg)  161 lb 11.2 oz (73.347 kg)  SpO2: 100% 95% 96%    Intake/Output Summary (Last 24 hours) at 10/02/13 0754 Last data filed at 10/02/13 0450  Gross per 24 hour  Intake      0 ml  Output   1100 ml  Net  -1100 ml   Filed Weights   10/01/13 2217 10/02/13 0400  Weight: 161 lb 11.2 oz (73.347 kg) 161 lb 11.2 oz (73.347 kg)    Subjective Denies chest pain or SOB  . amLODipine  5 mg Oral Daily  . aspirin EC  81 mg Oral Daily  . citalopram  20 mg Oral Daily  . fentaNYL  25 mcg Transdermal Q72H  . heparin  5,000 Units Subcutaneous 3 times per day  . levothyroxine  88 mcg Oral QAC breakfast  . losartan  100 mg Oral Daily      LABS: Basic Metabolic Panel:  Recent Labs  10/02/13 0039  CREATININE 0.69   Liver Function Tests: No results found for this basename: AST, ALT, ALKPHOS, BILITOT, PROT, ALBUMIN,  in the last 72 hours No results found for this basename: LIPASE, AMYLASE,  in the last 72 hours CBC:  Recent Labs  10/02/13 0039  WBC 13.9*  HGB 11.6*  HCT 35.0*  MCV 93.8  PLT 315   Cardiac Enzymes: No results found for this basename: CKTOTAL, CKMB, CKMBINDEX, TROPONINI,  in the last 72 hours BNP: No results found for this basename: PROBNP,  in the last 72 hours D-Dimer: No results found for this basename: DDIMER,  in the last 72 hours Hemoglobin A1C: No results found for this basename: HGBA1C,  in the last 72 hours Fasting Lipid Panel: No results found for this basename: CHOL, HDL, LDLCALC, TRIG, CHOLHDL, LDLDIRECT,  in the last 72 hours Thyroid Function Tests: No results found for this basename: TSH, T4TOTAL, FREET3, T3FREE, THYROIDAB,  in the  last 72 hours   Radiology/Studies:  No results found.  ECG: NSR, normal  PHYSICAL EXAM General: Well developed, well nourished, in no acute distress. Head: Normocephalic, atraumatic, sclera non-icteric, oropharynx is clear Neck: Negative for carotid bruits. JVD not elevated. No adenopathy Lungs: Clear bilaterally to auscultation without wheezes, rales, or rhonchi. Breathing is unlabored. Heart: RRR S1 S2 without murmurs, rubs, or gallops.  Abdomen: Soft, non-tender, non-distended with normoactive bowel sounds. No hepatomegaly. No rebound/guarding. No obvious abdominal masses. Msk:  Strength and tone appears normal for age. Extremities: No clubbing, cyanosis or edema.  Distal pedal pulses are 2+ and equal bilaterally. Neuro: Alert and oriented X 3. Moves all extremities spontaneously. Psych:  Responds to questions appropriately with a normal affect.  ASSESSMENT AND PLAN:  1. Prolonged chest pain. Transferred from [place] out for MI. Currently pain free. Etiology of pain is unclear. She is s/p cholecystectomy. She does have GERD but that pain is different. She had a negative cath > 7 years ago. She had a normal [place] in [place] on 06/02/13. She did have a CT of the chest in April 2014 that showed extensive aortic and coronary calcification. She  is planning on having cervical spine surgery next week at [facility]. Given the above factors I have recommended proceeding with cardiac cath today. The procedure and risks were reviewed including but not limited to death, myocardial infarction, stroke, arrythmias, bleeding, transfusion, emergency surgery, dye allergy, or renal dysfunction. The patient voices understanding and is agreeable to proceed. If negative for obstructive disease she can be DC later today and proceed with surgery next week. If she needs PCI she will need to delay spine surgery.  2. HTN controlled on amlodipine and losartan.   3. Tobacco abuse. States she quit  last week. On nicotine patch.   4. Lipid status is unknown. Will check lipid panel. Given coronary and aortic calcification I would  Recommend she take a statin.   Present on Admission:  . Chest pain . HTN (hypertension) . Hypothyroidism . Anemia  Signed, Peter Martinique, [place] 10/02/2013 7:54 AM

## 2013-10-02 NOTE — Discharge Summary (Signed)
Physician Discharge Summary       Patient ID: KERON KOFFMAN MRN: [id number] DOB/AGE: 1945/04/07 68 y.o.  Admit date: 10/01/2013 Discharge date: 10/02/2013  Admission Diagnoses:  Chest pain  Discharge Diagnoses:  Principal Problem:   Chest pain Active Problems:   HTN (hypertension)   Hypothyroidism   Anemia   Discharged Condition: stable  Hospital Course:   Ms. Alexis Ortega is a 68 yo woman with PMH of hypertension, hypothyroidism, GERD, depression and knee/back pain/prior surgeries who presented to [facility] Saturday evening with 10/10 chest pain that started at rest in her recliner. She characterized the pain as "it hurt" - not necessarily sharp or pressure with radiation to her back. The pain gradually resolved but did not improved with SL NTG x3. She also had some morphine but not prompt relief. At [facility] she had negative troponins, nonischemic ECG but given the severity of her pain and her upcoming pre-op for Spine/Neck Surgery this Wednesday with surgery the following Wednesday she requested transfer to [facility] for further evaluation and consideration of LHC at urging of patient and Dr. Dagoberto Ligas. During my history/exam with her and her daughter who is an RT at [employer] she told me she had a negative nuclear stress in March and a heart catheterization at least 7 years ago related to RUQ pain (gallbladder issues) both of which were negative. Her stress nuclear in March was "routine" according to Alexis Ortega because she has not ever had chest pain before. She is a former smoker but has no known family history of overt CAD. She has a cough/cold and is being treated with steroids orally.   The patient was admitted.  Troponin negative times one.  Elevated LDL.  Lipitor started this adm, however, TG and HDL are excellent, 89 and 74.  Will DC lipitor.  She was cathed which revealed normal coronary arteries.  HTN controlled on amlodipine and losartan. Tobacco abuse- States she  quit last week. On nicotine patch.  Cleared for her upcoming back surgery at low cardiovascular risk.  The patient was seen by Dr. Gwenlyn Found who felt she was stable for DC home.    Consults: None  Significant Diagnostic Studies:  Lipid Panel     Component Value Date/Time   CHOL 207* 10/02/2013 0704   TRIG 89 10/02/2013 0704   HDL 74 10/02/2013 0704   CHOLHDL 2.8 10/02/2013 0704   VLDL 18 10/02/2013 0704   LDLCALC 115* 10/02/2013 0704     CARDIAC CATHETERIZATION  History obtained from chart review.Alexis Ortega is a 68 year old female with positive cardiac risk factors admitted with chest pain. She ruled out for myocardial infarction. A CT scan of the chest did show coronary calcification. She scheduled for back surgery a week from today. Because of this, it was decided to proceed with diagnostic coronary arteriography to define her anatomy and to risk stratify her.  PROCEDURE DESCRIPTION:  The patient was brought to the second floor [place] [facility]iac cath lab in the postabsorptive state. She was premedicated with Valium 5 mg by mouth, IV Versed and fentanyl.. Her right groin  was prepped and shaved in usual sterile fashion. Xylocaine 1% was used for local anesthesia. A 5 French sheath was inserted into the right common femoral  artery using standard Seldinger technique. 5 French right and left Judkins diagnostic catheters along with a 5 French pigtail catheter were used for selective coronary angiography and left ventriculography respectively. Omnipaque dye was used for the entirety of the case .  Retrograde aortic, left ventricular and pullback pressures were recorded.  HEMODYNAMICS:  AO SYSTOLIC/AO DIASTOLIC: 027/25  LV SYSTOLIC/LV DIASTOLIC: 366/44  ANGIOGRAPHIC RESULTS:  1. Left main; normal  2. LAD; normal  3. Left circumflex; normal.  4. Right coronary artery; normal, dominant  5. Left ventriculography; RAO left ventriculogram was performed using  25 mL of Visipaque dye at 12 mL/second.  The overall LVEF estimated  60 % Without wall motion abnormalities  IMPRESSION:Alexis Ortega has normal coronary arteries and normal left ventricular function. I believe her chest pain was noncardiac. Total contrast administered the patient was 55 cc. A right femoral anterior was performed and a MYNX closure device was used to obtain hemostasis. The patient left the lab in stable condition. She'll be discharged on 2 hours and cleared for her upcoming back surgery at low cardiovascular risk.  Alexis Harp MD, [facility]  10/02/2013  4:54 PM      Treatments: See above  Discharge Exam: Blood pressure 145/56, pulse 59, temperature 98.1 F (36.7 C), temperature source Oral, resp. rate 18, height 5\' 5"  (1.651 m), weight 161 lb 11.2 oz (73.347 kg), SpO2 96.00%.   Disposition: 01-Home or Self Care      Discharge Instructions   Diet - low sodium heart healthy    Complete by:  As directed      Discharge instructions    Complete by:  As directed   No lifting more than a half gallon of milk or driving for three days.     Increase activity slowly    Complete by:  As directed             Medication List         ALPRAZolam 0.5 MG tablet  Commonly known as:  XANAX  Take 0.5 mg by mouth at bedtime as needed for sleep.     amLODipine 5 MG tablet  Commonly known as:  NORVASC  Take 1 tablet (5 mg total) by mouth daily.     ANORO ELLIPTA 62.5-25 MCG/INH Aepb  Generic drug:  Umeclidinium-Vilanterol  Inhale 1 puff into the lungs daily.     aspirin EC 81 MG tablet  Take 81 mg by mouth daily.     benzonatate 100 MG capsule  Commonly known as:  TESSALON  Take 100 mg by mouth 3 (three) times daily as needed for cough.     CALCIUM PO  Take 1 tablet by mouth daily.     citalopram 20 MG tablet  Commonly known as:  CELEXA  Take 20 mg by mouth daily.     fentaNYL 25 MCG/HR patch  Commonly known as:  DURAGESIC - dosed mcg/hr  Place 25 mcg onto the skin every 3 (three) days.      levothyroxine 88 MCG tablet  Commonly known as:  SYNTHROID, LEVOTHROID  Take 88 mcg by mouth daily before breakfast.     losartan 100 MG tablet  Commonly known as:  COZAAR  Take 100 mg by mouth daily.     methylPREDNIsolone 4 MG tablet  Commonly known as:  MEDROL DOSPACK  Take 4 mg by mouth as directed.     nicotine 21 mg/24hr patch  Commonly known as:  NICODERM CQ - dosed in mg/24 hours  Place 21 mg onto the skin daily.     oxyCODONE-acetaminophen 5-325 MG per tablet  Commonly known as:  PERCOCET/ROXICET  Take 1-2 tablets by mouth every 4 (four) hours as needed for pain.     PROAIR HFA 108 (  90 BASE) MCG/ACT inhaler  Generic drug:  albuterol  Inhale 2 puffs into the lungs every 4 (four) hours as needed for wheezing.       Follow-up Information   Follow up with Primary Care Provider. (Follow up with family doctor as needed. )       Signed: Tarri Fuller, PA-C 10/02/2013, 7:19 PM

## 2013-10-02 NOTE — Progress Notes (Signed)
Utilization review completed.  

## 2013-10-03 SURGERY — ECHOCARDIOGRAM, TRANSESOPHAGEAL
Anesthesia: Moderate Sedation

## 2013-10-04 DIAGNOSIS — M5030 Other cervical disc degeneration, unspecified cervical region: Secondary | ICD-10-CM | POA: Diagnosis not present

## 2013-10-04 DIAGNOSIS — Z01818 Encounter for other preprocedural examination: Secondary | ICD-10-CM | POA: Diagnosis not present

## 2013-10-06 DIAGNOSIS — R079 Chest pain, unspecified: Secondary | ICD-10-CM | POA: Diagnosis not present

## 2013-10-09 DIAGNOSIS — F411 Generalized anxiety disorder: Secondary | ICD-10-CM | POA: Diagnosis present

## 2013-10-09 DIAGNOSIS — Z981 Arthrodesis status: Secondary | ICD-10-CM | POA: Diagnosis not present

## 2013-10-09 DIAGNOSIS — D72829 Elevated white blood cell count, unspecified: Secondary | ICD-10-CM | POA: Diagnosis not present

## 2013-10-09 DIAGNOSIS — M5030 Other cervical disc degeneration, unspecified cervical region: Secondary | ICD-10-CM | POA: Diagnosis not present

## 2013-10-09 DIAGNOSIS — F17200 Nicotine dependence, unspecified, uncomplicated: Secondary | ICD-10-CM | POA: Diagnosis present

## 2013-10-09 DIAGNOSIS — M4802 Spinal stenosis, cervical region: Secondary | ICD-10-CM | POA: Diagnosis not present

## 2013-10-09 DIAGNOSIS — I10 Essential (primary) hypertension: Secondary | ICD-10-CM | POA: Diagnosis present

## 2013-10-09 DIAGNOSIS — R69 Illness, unspecified: Secondary | ICD-10-CM | POA: Diagnosis not present

## 2013-10-09 DIAGNOSIS — F341 Dysthymic disorder: Secondary | ICD-10-CM | POA: Diagnosis not present

## 2013-10-09 DIAGNOSIS — F3289 Other specified depressive episodes: Secondary | ICD-10-CM | POA: Diagnosis present

## 2013-10-09 DIAGNOSIS — E039 Hypothyroidism, unspecified: Secondary | ICD-10-CM | POA: Diagnosis not present

## 2013-10-09 DIAGNOSIS — F329 Major depressive disorder, single episode, unspecified: Secondary | ICD-10-CM | POA: Diagnosis present

## 2013-10-09 DIAGNOSIS — IMO0001 Reserved for inherently not codable concepts without codable children: Secondary | ICD-10-CM | POA: Diagnosis present

## 2013-10-19 DIAGNOSIS — M5030 Other cervical disc degeneration, unspecified cervical region: Secondary | ICD-10-CM | POA: Diagnosis not present

## 2013-11-09 DIAGNOSIS — M549 Dorsalgia, unspecified: Secondary | ICD-10-CM | POA: Diagnosis not present

## 2013-11-09 DIAGNOSIS — E039 Hypothyroidism, unspecified: Secondary | ICD-10-CM | POA: Diagnosis not present

## 2013-11-09 DIAGNOSIS — I10 Essential (primary) hypertension: Secondary | ICD-10-CM | POA: Diagnosis not present

## 2013-11-09 DIAGNOSIS — Z131 Encounter for screening for diabetes mellitus: Secondary | ICD-10-CM | POA: Diagnosis not present

## 2013-11-10 DIAGNOSIS — M5030 Other cervical disc degeneration, unspecified cervical region: Secondary | ICD-10-CM | POA: Diagnosis not present

## 2013-12-15 DIAGNOSIS — M4802 Spinal stenosis, cervical region: Secondary | ICD-10-CM | POA: Diagnosis not present

## 2013-12-19 DIAGNOSIS — Z86718 Personal history of other venous thrombosis and embolism: Secondary | ICD-10-CM | POA: Diagnosis not present

## 2013-12-19 DIAGNOSIS — R609 Edema, unspecified: Secondary | ICD-10-CM | POA: Diagnosis not present

## 2013-12-20 DIAGNOSIS — M7989 Other specified soft tissue disorders: Secondary | ICD-10-CM | POA: Diagnosis not present

## 2014-01-06 DIAGNOSIS — Z79899 Other long term (current) drug therapy: Secondary | ICD-10-CM | POA: Diagnosis not present

## 2014-01-06 DIAGNOSIS — T424X1A Poisoning by benzodiazepines, accidental (unintentional), initial encounter: Secondary | ICD-10-CM | POA: Diagnosis not present

## 2014-01-06 DIAGNOSIS — G894 Chronic pain syndrome: Secondary | ICD-10-CM | POA: Diagnosis present

## 2014-01-06 DIAGNOSIS — F419 Anxiety disorder, unspecified: Secondary | ICD-10-CM | POA: Diagnosis not present

## 2014-01-06 DIAGNOSIS — J449 Chronic obstructive pulmonary disease, unspecified: Secondary | ICD-10-CM | POA: Diagnosis not present

## 2014-01-06 DIAGNOSIS — I10 Essential (primary) hypertension: Secondary | ICD-10-CM | POA: Diagnosis not present

## 2014-01-06 DIAGNOSIS — K219 Gastro-esophageal reflux disease without esophagitis: Secondary | ICD-10-CM | POA: Diagnosis present

## 2014-01-06 DIAGNOSIS — F17200 Nicotine dependence, unspecified, uncomplicated: Secondary | ICD-10-CM | POA: Diagnosis present

## 2014-01-06 DIAGNOSIS — E039 Hypothyroidism, unspecified: Secondary | ICD-10-CM | POA: Diagnosis not present

## 2014-01-06 DIAGNOSIS — T50904A Poisoning by unspecified drugs, medicaments and biological substances, undetermined, initial encounter: Secondary | ICD-10-CM | POA: Diagnosis not present

## 2014-01-06 DIAGNOSIS — F329 Major depressive disorder, single episode, unspecified: Secondary | ICD-10-CM | POA: Diagnosis not present

## 2014-01-06 DIAGNOSIS — Z7982 Long term (current) use of aspirin: Secondary | ICD-10-CM | POA: Diagnosis not present

## 2014-01-06 DIAGNOSIS — T50902A Poisoning by unspecified drugs, medicaments and biological substances, intentional self-harm, initial encounter: Secondary | ICD-10-CM | POA: Diagnosis not present

## 2014-01-18 DIAGNOSIS — E039 Hypothyroidism, unspecified: Secondary | ICD-10-CM | POA: Diagnosis not present

## 2014-02-22 ENCOUNTER — Encounter (HOSPITAL_COMMUNITY): Payer: Self-pay | Admitting: Cardiovascular Disease

## 2014-03-06 DIAGNOSIS — S8992XA Unspecified injury of left lower leg, initial encounter: Secondary | ICD-10-CM | POA: Diagnosis not present

## 2014-03-06 DIAGNOSIS — M25562 Pain in left knee: Secondary | ICD-10-CM | POA: Diagnosis not present

## 2014-03-06 DIAGNOSIS — I10 Essential (primary) hypertension: Secondary | ICD-10-CM | POA: Diagnosis not present

## 2014-03-06 DIAGNOSIS — R51 Headache: Secondary | ICD-10-CM | POA: Diagnosis not present

## 2014-03-06 DIAGNOSIS — W010XXA Fall on same level from slipping, tripping and stumbling without subsequent striking against object, initial encounter: Secondary | ICD-10-CM | POA: Diagnosis not present

## 2014-03-06 DIAGNOSIS — S0993XA Unspecified injury of face, initial encounter: Secondary | ICD-10-CM | POA: Diagnosis not present

## 2014-03-06 DIAGNOSIS — S8991XA Unspecified injury of right lower leg, initial encounter: Secondary | ICD-10-CM | POA: Diagnosis not present

## 2014-03-06 DIAGNOSIS — S199XXA Unspecified injury of neck, initial encounter: Secondary | ICD-10-CM | POA: Diagnosis not present

## 2014-03-06 DIAGNOSIS — F17200 Nicotine dependence, unspecified, uncomplicated: Secondary | ICD-10-CM | POA: Diagnosis not present

## 2014-03-06 DIAGNOSIS — S63273A Dislocation of unspecified interphalangeal joint of left middle finger, initial encounter: Secondary | ICD-10-CM | POA: Diagnosis not present

## 2014-03-06 DIAGNOSIS — S61217A Laceration without foreign body of left little finger without damage to nail, initial encounter: Secondary | ICD-10-CM | POA: Diagnosis not present

## 2014-03-06 DIAGNOSIS — M542 Cervicalgia: Secondary | ICD-10-CM | POA: Diagnosis not present

## 2014-03-06 DIAGNOSIS — M25561 Pain in right knee: Secondary | ICD-10-CM | POA: Diagnosis not present

## 2014-03-06 DIAGNOSIS — Z9889 Other specified postprocedural states: Secondary | ICD-10-CM | POA: Diagnosis not present

## 2014-03-06 DIAGNOSIS — S0990XA Unspecified injury of head, initial encounter: Secondary | ICD-10-CM | POA: Diagnosis not present

## 2014-03-06 DIAGNOSIS — S63253A Unspecified dislocation of left middle finger, initial encounter: Secondary | ICD-10-CM | POA: Diagnosis not present

## 2014-03-20 DIAGNOSIS — M4806 Spinal stenosis, lumbar region: Secondary | ICD-10-CM | POA: Diagnosis not present

## 2014-03-20 DIAGNOSIS — M4802 Spinal stenosis, cervical region: Secondary | ICD-10-CM | POA: Diagnosis not present

## 2014-03-22 DIAGNOSIS — M542 Cervicalgia: Secondary | ICD-10-CM | POA: Diagnosis not present

## 2014-03-22 DIAGNOSIS — I10 Essential (primary) hypertension: Secondary | ICD-10-CM | POA: Diagnosis not present

## 2014-04-04 DIAGNOSIS — E039 Hypothyroidism, unspecified: Secondary | ICD-10-CM | POA: Diagnosis not present

## 2014-04-04 DIAGNOSIS — Z131 Encounter for screening for diabetes mellitus: Secondary | ICD-10-CM | POA: Diagnosis not present

## 2014-04-04 DIAGNOSIS — I10 Essential (primary) hypertension: Secondary | ICD-10-CM | POA: Diagnosis not present

## 2014-04-17 DIAGNOSIS — M4806 Spinal stenosis, lumbar region: Secondary | ICD-10-CM | POA: Diagnosis not present

## 2014-05-14 DIAGNOSIS — R079 Chest pain, unspecified: Secondary | ICD-10-CM | POA: Diagnosis not present

## 2014-05-14 DIAGNOSIS — M4806 Spinal stenosis, lumbar region: Secondary | ICD-10-CM | POA: Diagnosis not present

## 2014-05-14 DIAGNOSIS — F17200 Nicotine dependence, unspecified, uncomplicated: Secondary | ICD-10-CM | POA: Diagnosis not present

## 2014-05-14 DIAGNOSIS — I10 Essential (primary) hypertension: Secondary | ICD-10-CM | POA: Diagnosis not present

## 2014-05-14 DIAGNOSIS — E039 Hypothyroidism, unspecified: Secondary | ICD-10-CM | POA: Diagnosis not present

## 2014-05-14 DIAGNOSIS — J449 Chronic obstructive pulmonary disease, unspecified: Secondary | ICD-10-CM | POA: Diagnosis not present

## 2014-05-14 DIAGNOSIS — M797 Fibromyalgia: Secondary | ICD-10-CM | POA: Diagnosis not present

## 2014-05-15 DIAGNOSIS — R079 Chest pain, unspecified: Secondary | ICD-10-CM | POA: Diagnosis not present

## 2014-05-15 DIAGNOSIS — M4806 Spinal stenosis, lumbar region: Secondary | ICD-10-CM | POA: Diagnosis not present

## 2014-05-15 DIAGNOSIS — E039 Hypothyroidism, unspecified: Secondary | ICD-10-CM | POA: Diagnosis not present

## 2014-05-15 DIAGNOSIS — I10 Essential (primary) hypertension: Secondary | ICD-10-CM | POA: Diagnosis not present

## 2014-05-15 DIAGNOSIS — F17200 Nicotine dependence, unspecified, uncomplicated: Secondary | ICD-10-CM | POA: Diagnosis not present

## 2014-05-15 DIAGNOSIS — M797 Fibromyalgia: Secondary | ICD-10-CM | POA: Diagnosis not present

## 2014-05-29 DIAGNOSIS — R609 Edema, unspecified: Secondary | ICD-10-CM | POA: Diagnosis not present

## 2014-05-29 DIAGNOSIS — I10 Essential (primary) hypertension: Secondary | ICD-10-CM | POA: Diagnosis not present

## 2014-05-29 DIAGNOSIS — M542 Cervicalgia: Secondary | ICD-10-CM | POA: Diagnosis not present

## 2014-06-02 ENCOUNTER — Encounter (HOSPITAL_COMMUNITY): Payer: Self-pay | Admitting: Emergency Medicine

## 2014-06-02 ENCOUNTER — Emergency Department (HOSPITAL_COMMUNITY): Payer: No Typology Code available for payment source

## 2014-06-02 ENCOUNTER — Emergency Department (HOSPITAL_COMMUNITY)
Admission: EM | Admit: 2014-06-02 | Discharge: 2014-06-02 | Disposition: A | Discharge status: Home / Self Care | Payer: No Typology Code available for payment source | Attending: Emergency Medicine | Admitting: Emergency Medicine

## 2014-06-02 DIAGNOSIS — M546 Pain in thoracic spine: Secondary | ICD-10-CM | POA: Diagnosis not present

## 2014-06-02 DIAGNOSIS — M1990 Unspecified osteoarthritis, unspecified site: Secondary | ICD-10-CM | POA: Diagnosis not present

## 2014-06-02 DIAGNOSIS — J45909 Unspecified asthma, uncomplicated: Secondary | ICD-10-CM | POA: Diagnosis not present

## 2014-06-02 DIAGNOSIS — Z79899 Other long term (current) drug therapy: Secondary | ICD-10-CM | POA: Diagnosis not present

## 2014-06-02 DIAGNOSIS — Z9889 Other specified postprocedural states: Secondary | ICD-10-CM | POA: Insufficient documentation

## 2014-06-02 DIAGNOSIS — M25512 Pain in left shoulder: Secondary | ICD-10-CM | POA: Diagnosis not present

## 2014-06-02 DIAGNOSIS — S29019A Strain of muscle and tendon of unspecified wall of thorax, initial encounter: Secondary | ICD-10-CM

## 2014-06-02 DIAGNOSIS — E039 Hypothyroidism, unspecified: Secondary | ICD-10-CM | POA: Diagnosis not present

## 2014-06-02 DIAGNOSIS — M797 Fibromyalgia: Secondary | ICD-10-CM | POA: Insufficient documentation

## 2014-06-02 DIAGNOSIS — M549 Dorsalgia, unspecified: Secondary | ICD-10-CM | POA: Diagnosis not present

## 2014-06-02 DIAGNOSIS — S79912A Unspecified injury of left hip, initial encounter: Secondary | ICD-10-CM | POA: Diagnosis not present

## 2014-06-02 DIAGNOSIS — S29012A Strain of muscle and tendon of back wall of thorax, initial encounter: Secondary | ICD-10-CM | POA: Insufficient documentation

## 2014-06-02 DIAGNOSIS — Y9389 Activity, other specified: Secondary | ICD-10-CM | POA: Insufficient documentation

## 2014-06-02 DIAGNOSIS — M7989 Other specified soft tissue disorders: Secondary | ICD-10-CM | POA: Diagnosis not present

## 2014-06-02 DIAGNOSIS — M79605 Pain in left leg: Secondary | ICD-10-CM | POA: Diagnosis not present

## 2014-06-02 DIAGNOSIS — S39012A Strain of muscle, fascia and tendon of lower back, initial encounter: Secondary | ICD-10-CM

## 2014-06-02 DIAGNOSIS — I10 Essential (primary) hypertension: Secondary | ICD-10-CM | POA: Insufficient documentation

## 2014-06-02 DIAGNOSIS — S8992XA Unspecified injury of left lower leg, initial encounter: Secondary | ICD-10-CM | POA: Diagnosis not present

## 2014-06-02 DIAGNOSIS — M25552 Pain in left hip: Secondary | ICD-10-CM | POA: Diagnosis not present

## 2014-06-02 DIAGNOSIS — F329 Major depressive disorder, single episode, unspecified: Secondary | ICD-10-CM | POA: Insufficient documentation

## 2014-06-02 DIAGNOSIS — S4992XA Unspecified injury of left shoulder and upper arm, initial encounter: Secondary | ICD-10-CM | POA: Diagnosis not present

## 2014-06-02 DIAGNOSIS — S3992XA Unspecified injury of lower back, initial encounter: Secondary | ICD-10-CM | POA: Diagnosis not present

## 2014-06-02 DIAGNOSIS — Y92410 Unspecified street and highway as the place of occurrence of the external cause: Secondary | ICD-10-CM | POA: Diagnosis not present

## 2014-06-02 DIAGNOSIS — Z7982 Long term (current) use of aspirin: Secondary | ICD-10-CM | POA: Insufficient documentation

## 2014-06-02 DIAGNOSIS — S299XXA Unspecified injury of thorax, initial encounter: Secondary | ICD-10-CM | POA: Diagnosis not present

## 2014-06-02 DIAGNOSIS — Z87891 Personal history of nicotine dependence: Secondary | ICD-10-CM | POA: Insufficient documentation

## 2014-06-02 DIAGNOSIS — Y998 Other external cause status: Secondary | ICD-10-CM | POA: Insufficient documentation

## 2014-06-02 DIAGNOSIS — S161XXA Strain of muscle, fascia and tendon at neck level, initial encounter: Secondary | ICD-10-CM | POA: Diagnosis not present

## 2014-06-02 MED ORDER — LORAZEPAM 2 MG/ML IJ SOLN: 1.0000 mg | Freq: Once | INTRAMUSCULAR | Status: DC

## 2014-06-02 MED ORDER — ONDANSETRON HCL 4 MG/2ML IJ SOLN
4.0000 mg | Freq: Once | INTRAMUSCULAR | Status: AC
  Administered 2014-06-02: 4 mg via INTRAVENOUS
  Filled 2014-06-02: qty 2

## 2014-06-02 MED ORDER — MORPHINE SULFATE 4 MG/ML IJ SOLN
4.0000 mg | Freq: Once | INTRAMUSCULAR | Status: AC
  Administered 2014-06-02: 4 mg via INTRAVENOUS
  Filled 2014-06-02: qty 1

## 2014-06-02 MED ORDER — CYCLOBENZAPRINE HCL 10 MG PO TABS: 10.0000 mg | ORAL_TABLET | Freq: Two times a day (BID) | ORAL | Status: DC | PRN
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
## 2014-06-02 NOTE — Progress Notes (Signed)
VASCULAR LAB PRELIMINARY  PRELIMINARY  PRELIMINARY  PRELIMINARY  Left lower extremity venous Doppler completed.    Preliminary report:  There is no DVT or SVT noted in the left lower extremity. Carianne Taira, RVT 06/02/2014, 5:11 PM
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
## 2014-06-02 NOTE — ED Notes (Signed)
Patient transported to Vascular 

## 2014-06-05 DIAGNOSIS — M542 Cervicalgia: Secondary | ICD-10-CM | POA: Diagnosis not present

## 2014-06-05 DIAGNOSIS — M25552 Pain in left hip: Secondary | ICD-10-CM | POA: Diagnosis not present

## 2014-06-05 DIAGNOSIS — M4806 Spinal stenosis, lumbar region: Secondary | ICD-10-CM | POA: Diagnosis not present

## 2014-06-05 DIAGNOSIS — M25512 Pain in left shoulder: Secondary | ICD-10-CM | POA: Diagnosis not present

## 2014-07-24 DIAGNOSIS — M4802 Spinal stenosis, cervical region: Secondary | ICD-10-CM | POA: Diagnosis not present

## 2014-07-24 DIAGNOSIS — M7582 Other shoulder lesions, left shoulder: Secondary | ICD-10-CM | POA: Diagnosis not present

## 2014-07-24 DIAGNOSIS — M5033 Other cervical disc degeneration, cervicothoracic region: Secondary | ICD-10-CM | POA: Diagnosis not present

## 2014-07-27 DIAGNOSIS — M549 Dorsalgia, unspecified: Secondary | ICD-10-CM | POA: Diagnosis not present

## 2014-07-27 DIAGNOSIS — I10 Essential (primary) hypertension: Secondary | ICD-10-CM | POA: Diagnosis not present

## 2014-07-28 DIAGNOSIS — M4806 Spinal stenosis, lumbar region: Secondary | ICD-10-CM | POA: Diagnosis not present

## 2014-07-28 DIAGNOSIS — M4802 Spinal stenosis, cervical region: Secondary | ICD-10-CM | POA: Diagnosis not present

## 2014-07-28 DIAGNOSIS — M7582 Other shoulder lesions, left shoulder: Secondary | ICD-10-CM | POA: Diagnosis not present

## 2014-07-31 DIAGNOSIS — M4802 Spinal stenosis, cervical region: Secondary | ICD-10-CM | POA: Diagnosis not present

## 2014-07-31 DIAGNOSIS — M7582 Other shoulder lesions, left shoulder: Secondary | ICD-10-CM | POA: Diagnosis not present

## 2014-07-31 DIAGNOSIS — M4806 Spinal stenosis, lumbar region: Secondary | ICD-10-CM | POA: Diagnosis not present

## 2014-09-10 ENCOUNTER — Other Ambulatory Visit: Payer: Self-pay

## 2014-09-25 DIAGNOSIS — I10 Essential (primary) hypertension: Secondary | ICD-10-CM | POA: Diagnosis not present

## 2014-09-25 DIAGNOSIS — M549 Dorsalgia, unspecified: Secondary | ICD-10-CM | POA: Diagnosis not present

## 2014-09-26 DIAGNOSIS — Z1211 Encounter for screening for malignant neoplasm of colon: Secondary | ICD-10-CM | POA: Diagnosis not present

## 2014-10-02 DIAGNOSIS — M4806 Spinal stenosis, lumbar region: Secondary | ICD-10-CM | POA: Diagnosis not present

## 2014-10-02 DIAGNOSIS — M5030 Other cervical disc degeneration, unspecified cervical region: Secondary | ICD-10-CM | POA: Diagnosis not present

## 2014-10-02 DIAGNOSIS — M791 Myalgia: Secondary | ICD-10-CM | POA: Diagnosis not present

## 2014-10-02 DIAGNOSIS — M75120 Complete rotator cuff tear or rupture of unspecified shoulder, not specified as traumatic: Secondary | ICD-10-CM | POA: Diagnosis not present

## 2014-10-04 DIAGNOSIS — M25512 Pain in left shoulder: Secondary | ICD-10-CM | POA: Diagnosis not present

## 2014-10-30 DIAGNOSIS — S22009A Unspecified fracture of unspecified thoracic vertebra, initial encounter for closed fracture: Secondary | ICD-10-CM | POA: Diagnosis not present

## 2014-10-30 DIAGNOSIS — M25512 Pain in left shoulder: Secondary | ICD-10-CM | POA: Diagnosis not present

## 2014-10-31 DIAGNOSIS — S22009A Unspecified fracture of unspecified thoracic vertebra, initial encounter for closed fracture: Secondary | ICD-10-CM | POA: Diagnosis not present

## 2014-11-08 DIAGNOSIS — M47814 Spondylosis without myelopathy or radiculopathy, thoracic region: Secondary | ICD-10-CM | POA: Diagnosis not present

## 2014-11-22 DIAGNOSIS — F17200 Nicotine dependence, unspecified, uncomplicated: Secondary | ICD-10-CM | POA: Diagnosis not present

## 2014-11-22 DIAGNOSIS — K5730 Diverticulosis of large intestine without perforation or abscess without bleeding: Secondary | ICD-10-CM | POA: Diagnosis not present

## 2014-11-22 DIAGNOSIS — K635 Polyp of colon: Secondary | ICD-10-CM | POA: Diagnosis not present

## 2014-11-22 DIAGNOSIS — I10 Essential (primary) hypertension: Secondary | ICD-10-CM | POA: Diagnosis not present

## 2014-11-22 DIAGNOSIS — K621 Rectal polyp: Secondary | ICD-10-CM | POA: Diagnosis not present

## 2014-11-22 DIAGNOSIS — R140 Abdominal distension (gaseous): Secondary | ICD-10-CM | POA: Diagnosis not present

## 2014-11-22 DIAGNOSIS — E039 Hypothyroidism, unspecified: Secondary | ICD-10-CM | POA: Diagnosis not present

## 2014-11-22 DIAGNOSIS — Z1211 Encounter for screening for malignant neoplasm of colon: Secondary | ICD-10-CM | POA: Diagnosis not present

## 2014-11-22 DIAGNOSIS — F329 Major depressive disorder, single episode, unspecified: Secondary | ICD-10-CM | POA: Diagnosis not present

## 2014-11-22 DIAGNOSIS — K641 Second degree hemorrhoids: Secondary | ICD-10-CM | POA: Diagnosis not present

## 2014-11-22 DIAGNOSIS — R109 Unspecified abdominal pain: Secondary | ICD-10-CM | POA: Diagnosis not present

## 2014-11-26 DIAGNOSIS — I10 Essential (primary) hypertension: Secondary | ICD-10-CM | POA: Diagnosis not present

## 2014-11-26 DIAGNOSIS — M549 Dorsalgia, unspecified: Secondary | ICD-10-CM | POA: Diagnosis not present

## 2014-11-28 DIAGNOSIS — E039 Hypothyroidism, unspecified: Secondary | ICD-10-CM | POA: Diagnosis not present

## 2014-11-28 DIAGNOSIS — I10 Essential (primary) hypertension: Secondary | ICD-10-CM | POA: Diagnosis not present

## 2014-12-14 DIAGNOSIS — M25511 Pain in right shoulder: Secondary | ICD-10-CM | POA: Diagnosis not present

## 2014-12-20 DIAGNOSIS — M7541 Impingement syndrome of right shoulder: Secondary | ICD-10-CM | POA: Diagnosis not present

## 2014-12-24 DIAGNOSIS — M7542 Impingement syndrome of left shoulder: Secondary | ICD-10-CM | POA: Diagnosis not present

## 2014-12-26 DIAGNOSIS — R071 Chest pain on breathing: Secondary | ICD-10-CM | POA: Diagnosis not present

## 2014-12-26 DIAGNOSIS — F17210 Nicotine dependence, cigarettes, uncomplicated: Secondary | ICD-10-CM | POA: Diagnosis not present

## 2014-12-26 DIAGNOSIS — M19012 Primary osteoarthritis, left shoulder: Secondary | ICD-10-CM | POA: Diagnosis not present

## 2014-12-26 DIAGNOSIS — I10 Essential (primary) hypertension: Secondary | ICD-10-CM | POA: Diagnosis not present

## 2014-12-26 DIAGNOSIS — S46012A Strain of muscle(s) and tendon(s) of the rotator cuff of left shoulder, initial encounter: Secondary | ICD-10-CM | POA: Diagnosis not present

## 2014-12-26 DIAGNOSIS — Z7982 Long term (current) use of aspirin: Secondary | ICD-10-CM | POA: Diagnosis not present

## 2014-12-26 DIAGNOSIS — S46012D Strain of muscle(s) and tendon(s) of the rotator cuff of left shoulder, subsequent encounter: Secondary | ICD-10-CM | POA: Diagnosis not present

## 2014-12-26 DIAGNOSIS — F17200 Nicotine dependence, unspecified, uncomplicated: Secondary | ICD-10-CM | POA: Diagnosis not present

## 2014-12-31 DIAGNOSIS — M6281 Muscle weakness (generalized): Secondary | ICD-10-CM | POA: Diagnosis not present

## 2014-12-31 DIAGNOSIS — M25512 Pain in left shoulder: Secondary | ICD-10-CM | POA: Diagnosis not present

## 2015-01-02 DIAGNOSIS — M25512 Pain in left shoulder: Secondary | ICD-10-CM | POA: Diagnosis not present

## 2015-01-02 DIAGNOSIS — M6281 Muscle weakness (generalized): Secondary | ICD-10-CM | POA: Diagnosis not present

## 2015-01-15 DIAGNOSIS — M6281 Muscle weakness (generalized): Secondary | ICD-10-CM | POA: Diagnosis not present

## 2015-01-15 DIAGNOSIS — M25512 Pain in left shoulder: Secondary | ICD-10-CM | POA: Diagnosis not present

## 2015-01-17 DIAGNOSIS — M25512 Pain in left shoulder: Secondary | ICD-10-CM | POA: Diagnosis not present

## 2015-01-17 DIAGNOSIS — M6281 Muscle weakness (generalized): Secondary | ICD-10-CM | POA: Diagnosis not present

## 2015-01-22 DIAGNOSIS — M6281 Muscle weakness (generalized): Secondary | ICD-10-CM | POA: Diagnosis not present

## 2015-01-22 DIAGNOSIS — M25512 Pain in left shoulder: Secondary | ICD-10-CM | POA: Diagnosis not present

## 2015-01-28 DIAGNOSIS — M25512 Pain in left shoulder: Secondary | ICD-10-CM | POA: Diagnosis not present

## 2015-01-28 DIAGNOSIS — J440 Chronic obstructive pulmonary disease with acute lower respiratory infection: Secondary | ICD-10-CM | POA: Diagnosis not present

## 2015-01-28 DIAGNOSIS — I10 Essential (primary) hypertension: Secondary | ICD-10-CM | POA: Diagnosis not present

## 2015-01-28 DIAGNOSIS — M6281 Muscle weakness (generalized): Secondary | ICD-10-CM | POA: Diagnosis not present

## 2015-01-28 DIAGNOSIS — M545 Low back pain: Secondary | ICD-10-CM | POA: Diagnosis not present

## 2015-02-15 DIAGNOSIS — M545 Low back pain: Secondary | ICD-10-CM | POA: Diagnosis not present

## 2015-04-02 DIAGNOSIS — J440 Chronic obstructive pulmonary disease with acute lower respiratory infection: Secondary | ICD-10-CM | POA: Diagnosis not present

## 2015-04-02 DIAGNOSIS — I10 Essential (primary) hypertension: Secondary | ICD-10-CM | POA: Diagnosis not present

## 2015-04-02 DIAGNOSIS — M545 Low back pain: Secondary | ICD-10-CM | POA: Diagnosis not present

## 2015-05-08 DIAGNOSIS — L570 Actinic keratosis: Secondary | ICD-10-CM | POA: Diagnosis not present

## 2015-05-08 DIAGNOSIS — L821 Other seborrheic keratosis: Secondary | ICD-10-CM | POA: Diagnosis not present

## 2015-05-08 DIAGNOSIS — D1800 Hemangioma unspecified site: Secondary | ICD-10-CM | POA: Diagnosis not present

## 2015-06-03 DIAGNOSIS — Z1389 Encounter for screening for other disorder: Secondary | ICD-10-CM | POA: Diagnosis not present

## 2015-06-03 DIAGNOSIS — M545 Low back pain: Secondary | ICD-10-CM | POA: Diagnosis not present

## 2015-06-03 DIAGNOSIS — Z0000 Encounter for general adult medical examination without abnormal findings: Secondary | ICD-10-CM | POA: Diagnosis not present

## 2015-08-06 DIAGNOSIS — M545 Low back pain: Secondary | ICD-10-CM | POA: Diagnosis not present

## 2015-09-20 DIAGNOSIS — L2389 Allergic contact dermatitis due to other agents: Secondary | ICD-10-CM | POA: Diagnosis not present

## 2015-09-20 DIAGNOSIS — R928 Other abnormal and inconclusive findings on diagnostic imaging of breast: Secondary | ICD-10-CM | POA: Diagnosis not present

## 2015-09-20 DIAGNOSIS — Z1231 Encounter for screening mammogram for malignant neoplasm of breast: Secondary | ICD-10-CM | POA: Diagnosis not present

## 2015-10-09 DIAGNOSIS — R928 Other abnormal and inconclusive findings on diagnostic imaging of breast: Secondary | ICD-10-CM | POA: Diagnosis not present

## 2015-11-05 DIAGNOSIS — M5124 Other intervertebral disc displacement, thoracic region: Secondary | ICD-10-CM | POA: Diagnosis not present

## 2015-11-20 DIAGNOSIS — M545 Low back pain: Secondary | ICD-10-CM | POA: Diagnosis not present

## 2015-11-20 DIAGNOSIS — Z79899 Other long term (current) drug therapy: Secondary | ICD-10-CM | POA: Diagnosis not present

## 2015-11-20 DIAGNOSIS — I10 Essential (primary) hypertension: Secondary | ICD-10-CM | POA: Diagnosis not present

## 2016-01-20 DIAGNOSIS — I10 Essential (primary) hypertension: Secondary | ICD-10-CM | POA: Diagnosis not present

## 2016-01-20 DIAGNOSIS — M545 Low back pain: Secondary | ICD-10-CM | POA: Diagnosis not present

## 2016-03-18 DIAGNOSIS — E038 Other specified hypothyroidism: Secondary | ICD-10-CM | POA: Diagnosis not present

## 2016-03-18 DIAGNOSIS — M545 Low back pain: Secondary | ICD-10-CM | POA: Diagnosis not present

## 2016-03-18 DIAGNOSIS — I10 Essential (primary) hypertension: Secondary | ICD-10-CM | POA: Diagnosis not present

## 2016-03-20 ENCOUNTER — Other Ambulatory Visit (HOSPITAL_COMMUNITY): Payer: Self-pay | Admitting: Internal Medicine

## 2016-03-20 DIAGNOSIS — Z87891 Personal history of nicotine dependence: Secondary | ICD-10-CM

## 2016-03-20 DIAGNOSIS — R0602 Shortness of breath: Secondary | ICD-10-CM

## 2016-03-30 ENCOUNTER — Ambulatory Visit (HOSPITAL_COMMUNITY): Payer: Medicare Other

## 2016-03-31 ENCOUNTER — Ambulatory Visit (HOSPITAL_COMMUNITY): Admission: RE | Admit: 2016-03-31 | Payer: Medicare Other | Source: Ambulatory Visit

## 2016-04-24 DIAGNOSIS — J440 Chronic obstructive pulmonary disease with acute lower respiratory infection: Secondary | ICD-10-CM | POA: Diagnosis not present

## 2016-05-21 DIAGNOSIS — I10 Essential (primary) hypertension: Secondary | ICD-10-CM | POA: Diagnosis not present

## 2016-05-21 DIAGNOSIS — E038 Other specified hypothyroidism: Secondary | ICD-10-CM | POA: Diagnosis not present

## 2016-05-21 DIAGNOSIS — J440 Chronic obstructive pulmonary disease with acute lower respiratory infection: Secondary | ICD-10-CM | POA: Diagnosis not present

## 2016-05-21 DIAGNOSIS — M545 Low back pain: Secondary | ICD-10-CM | POA: Diagnosis not present

## 2016-06-03 ENCOUNTER — Ambulatory Visit (HOSPITAL_COMMUNITY)
Admission: RE | Admit: 2016-06-03 | Discharge: 2016-06-03 | Disposition: A | Discharge status: Home / Self Care | Payer: Medicare Other | Source: Ambulatory Visit | Attending: Internal Medicine | Admitting: Internal Medicine

## 2016-06-03 DIAGNOSIS — I700 Atherosclerosis of aorta: Secondary | ICD-10-CM | POA: Insufficient documentation

## 2016-06-03 DIAGNOSIS — I2510 Atherosclerotic heart disease of native coronary artery without angina pectoris: Secondary | ICD-10-CM | POA: Insufficient documentation

## 2016-06-03 DIAGNOSIS — Z87891 Personal history of nicotine dependence: Secondary | ICD-10-CM | POA: Diagnosis not present

## 2016-06-03 DIAGNOSIS — J439 Emphysema, unspecified: Secondary | ICD-10-CM | POA: Diagnosis not present

## 2016-06-03 DIAGNOSIS — R0602 Shortness of breath: Secondary | ICD-10-CM | POA: Diagnosis not present

## 2016-06-03 DIAGNOSIS — F17210 Nicotine dependence, cigarettes, uncomplicated: Secondary | ICD-10-CM | POA: Diagnosis not present

## 2016-06-03 DIAGNOSIS — K449 Diaphragmatic hernia without obstruction or gangrene: Secondary | ICD-10-CM | POA: Insufficient documentation

## 2016-06-27 DIAGNOSIS — I10 Essential (primary) hypertension: Secondary | ICD-10-CM | POA: Diagnosis not present

## 2016-06-27 DIAGNOSIS — R040 Epistaxis: Secondary | ICD-10-CM | POA: Diagnosis not present

## 2016-06-27 DIAGNOSIS — Z79899 Other long term (current) drug therapy: Secondary | ICD-10-CM | POA: Diagnosis not present

## 2016-07-07 DIAGNOSIS — J440 Chronic obstructive pulmonary disease with acute lower respiratory infection: Secondary | ICD-10-CM | POA: Diagnosis not present

## 2016-07-07 DIAGNOSIS — R040 Epistaxis: Secondary | ICD-10-CM | POA: Diagnosis not present

## 2016-07-10 DIAGNOSIS — Z13220 Encounter for screening for lipoid disorders: Secondary | ICD-10-CM | POA: Diagnosis not present

## 2016-07-10 DIAGNOSIS — Z1389 Encounter for screening for other disorder: Secondary | ICD-10-CM | POA: Diagnosis not present

## 2016-07-10 DIAGNOSIS — Z131 Encounter for screening for diabetes mellitus: Secondary | ICD-10-CM | POA: Diagnosis not present

## 2016-07-10 DIAGNOSIS — R5383 Other fatigue: Secondary | ICD-10-CM | POA: Diagnosis not present

## 2016-07-10 DIAGNOSIS — E038 Other specified hypothyroidism: Secondary | ICD-10-CM | POA: Diagnosis not present

## 2016-07-10 DIAGNOSIS — R7303 Prediabetes: Secondary | ICD-10-CM | POA: Diagnosis not present

## 2016-07-10 DIAGNOSIS — Z0000 Encounter for general adult medical examination without abnormal findings: Secondary | ICD-10-CM | POA: Diagnosis not present

## 2016-07-16 ENCOUNTER — Ambulatory Visit (INDEPENDENT_AMBULATORY_CARE_PROVIDER_SITE_OTHER): Payer: Medicare Other | Admitting: Otolaryngology

## 2016-07-16 DIAGNOSIS — R040 Epistaxis: Secondary | ICD-10-CM | POA: Diagnosis not present

## 2016-08-05 DIAGNOSIS — M5124 Other intervertebral disc displacement, thoracic region: Secondary | ICD-10-CM | POA: Diagnosis not present

## 2016-08-05 DIAGNOSIS — M542 Cervicalgia: Secondary | ICD-10-CM | POA: Diagnosis not present

## 2016-08-11 DIAGNOSIS — M25562 Pain in left knee: Secondary | ICD-10-CM | POA: Diagnosis not present

## 2016-08-20 DIAGNOSIS — H538 Other visual disturbances: Secondary | ICD-10-CM | POA: Diagnosis not present

## 2016-08-20 DIAGNOSIS — H26493 Other secondary cataract, bilateral: Secondary | ICD-10-CM | POA: Diagnosis not present

## 2016-08-20 DIAGNOSIS — I10 Essential (primary) hypertension: Secondary | ICD-10-CM | POA: Diagnosis not present

## 2016-08-20 DIAGNOSIS — Z961 Presence of intraocular lens: Secondary | ICD-10-CM | POA: Diagnosis not present

## 2016-08-28 DIAGNOSIS — M545 Low back pain: Secondary | ICD-10-CM | POA: Diagnosis not present

## 2016-08-28 DIAGNOSIS — J440 Chronic obstructive pulmonary disease with acute lower respiratory infection: Secondary | ICD-10-CM | POA: Diagnosis not present

## 2016-08-28 DIAGNOSIS — I700 Atherosclerosis of aorta: Secondary | ICD-10-CM | POA: Diagnosis not present

## 2016-09-22 DIAGNOSIS — M48061 Spinal stenosis, lumbar region without neurogenic claudication: Secondary | ICD-10-CM | POA: Diagnosis not present

## 2016-09-24 DIAGNOSIS — H26492 Other secondary cataract, left eye: Secondary | ICD-10-CM | POA: Diagnosis not present

## 2016-09-24 DIAGNOSIS — Z961 Presence of intraocular lens: Secondary | ICD-10-CM | POA: Diagnosis not present

## 2016-10-02 DIAGNOSIS — M25562 Pain in left knee: Secondary | ICD-10-CM | POA: Diagnosis not present

## 2016-10-02 DIAGNOSIS — M545 Low back pain: Secondary | ICD-10-CM | POA: Diagnosis not present

## 2016-10-02 DIAGNOSIS — M1612 Unilateral primary osteoarthritis, left hip: Secondary | ICD-10-CM | POA: Diagnosis not present

## 2016-10-14 DIAGNOSIS — L821 Other seborrheic keratosis: Secondary | ICD-10-CM | POA: Diagnosis not present

## 2016-10-14 DIAGNOSIS — L820 Inflamed seborrheic keratosis: Secondary | ICD-10-CM | POA: Diagnosis not present

## 2016-10-14 DIAGNOSIS — D1801 Hemangioma of skin and subcutaneous tissue: Secondary | ICD-10-CM | POA: Diagnosis not present

## 2016-10-14 DIAGNOSIS — D225 Melanocytic nevi of trunk: Secondary | ICD-10-CM | POA: Diagnosis not present

## 2016-11-02 DIAGNOSIS — M8588 Other specified disorders of bone density and structure, other site: Secondary | ICD-10-CM | POA: Diagnosis not present

## 2016-11-02 DIAGNOSIS — M810 Age-related osteoporosis without current pathological fracture: Secondary | ICD-10-CM | POA: Diagnosis not present

## 2016-11-03 DIAGNOSIS — J440 Chronic obstructive pulmonary disease with acute lower respiratory infection: Secondary | ICD-10-CM | POA: Diagnosis not present

## 2016-11-03 DIAGNOSIS — M545 Low back pain: Secondary | ICD-10-CM | POA: Diagnosis not present

## 2016-11-03 DIAGNOSIS — I700 Atherosclerosis of aorta: Secondary | ICD-10-CM | POA: Diagnosis not present

## 2016-12-03 DIAGNOSIS — Z1231 Encounter for screening mammogram for malignant neoplasm of breast: Secondary | ICD-10-CM | POA: Diagnosis not present

## 2016-12-12 ENCOUNTER — Encounter (HOSPITAL_COMMUNITY): Payer: Self-pay

## 2016-12-12 ENCOUNTER — Emergency Department (HOSPITAL_COMMUNITY): Payer: Medicare Other

## 2016-12-12 ENCOUNTER — Observation Stay (HOSPITAL_COMMUNITY)
Admission: EM | Admit: 2016-12-12 | Discharge: 2016-12-13 | Disposition: A | Discharge status: Home / Self Care | Payer: Medicare Other | Attending: Family Medicine | Admitting: Family Medicine

## 2016-12-12 DIAGNOSIS — Z7982 Long term (current) use of aspirin: Secondary | ICD-10-CM | POA: Diagnosis not present

## 2016-12-12 DIAGNOSIS — J441 Chronic obstructive pulmonary disease with (acute) exacerbation: Principal | ICD-10-CM | POA: Diagnosis present

## 2016-12-12 DIAGNOSIS — E039 Hypothyroidism, unspecified: Secondary | ICD-10-CM | POA: Insufficient documentation

## 2016-12-12 DIAGNOSIS — R0602 Shortness of breath: Secondary | ICD-10-CM | POA: Diagnosis present

## 2016-12-12 DIAGNOSIS — Z79899 Other long term (current) drug therapy: Secondary | ICD-10-CM | POA: Diagnosis not present

## 2016-12-12 DIAGNOSIS — I10 Essential (primary) hypertension: Secondary | ICD-10-CM | POA: Diagnosis present

## 2016-12-12 DIAGNOSIS — Z96652 Presence of left artificial knee joint: Secondary | ICD-10-CM | POA: Diagnosis not present

## 2016-12-12 DIAGNOSIS — Z720 Tobacco use: Secondary | ICD-10-CM | POA: Diagnosis present

## 2016-12-12 DIAGNOSIS — Z87891 Personal history of nicotine dependence: Secondary | ICD-10-CM | POA: Diagnosis not present

## 2016-12-12 LAB — BASIC METABOLIC PANEL
Anion gap: 10 (ref 5–15)
BUN: 16 mg/dL (ref 6–20)
CHLORIDE: 102 mmol/L (ref 101–111)
CO2: 26 mmol/L (ref 22–32)
CREATININE: 1.02 mg/dL — AB (ref 0.44–1.00)
Calcium: 9.1 mg/dL (ref 8.9–10.3)
GFR calc Af Amer: >60 mL/min (ref >60)
GFR calc non Af Amer: 54 mL/min — ABNORMAL LOW (ref >60)
Glucose, Bld: 125 mg/dL — ABNORMAL HIGH (ref 65–99)
Potassium: 3.7 mmol/L (ref 3.5–5.1)
SODIUM: 138 mmol/L (ref 135–145)

## 2016-12-12 LAB — CBC WITH DIFFERENTIAL/PLATELET
Basophils Absolute: 0.1 10*3/uL (ref 0.0–0.1)
Basophils Relative: 1 %
Eosinophils Absolute: 1.3 10*3/uL — ABNORMAL HIGH (ref 0.0–0.7)
Eosinophils Relative: 13 %
HCT: 36.4 % (ref 36.0–46.0)
Hemoglobin: 12.4 g/dL (ref 12.0–15.0)
Lymphocytes Relative: 23 %
Lymphs Abs: 2.4 10*3/uL (ref 0.7–4.0)
MCH: 31.3 pg (ref 26.0–34.0)
MCHC: 34.1 g/dL (ref 30.0–36.0)
MCV: 91.9 fL (ref 78.0–100.0)
Monocytes Absolute: 0.9 10*3/uL (ref 0.1–1.0)
Monocytes Relative: 8 %
NEUTROS ABS: 5.6 10*3/uL (ref 1.7–7.7)
NEUTROS PCT: 55 %
Platelets: 262 10*3/uL (ref 150–400)
RBC: 3.96 MIL/uL (ref 3.87–5.11)
RDW: 14.3 % (ref 11.5–15.5)
WBC: 10.3 10*3/uL (ref 4.0–10.5)

## 2016-12-12 LAB — I-STAT TROPONIN, ED: Troponin i, poc: 0 ng/mL (ref 0.00–0.08)

## 2016-12-12 LAB — TROPONIN I: Troponin I: <0.03 ng/mL (ref <0.03)

## 2016-12-12 MED ORDER — IPRATROPIUM-ALBUTEROL 0.5-2.5 (3) MG/3ML IN SOLN
3.0000 mL | Freq: Once | RESPIRATORY_TRACT | Status: AC
  Administered 2016-12-12: 3 mL via RESPIRATORY_TRACT
  Filled 2016-12-12: qty 3

## 2016-12-12 MED ORDER — ALBUTEROL (5 MG/ML) CONTINUOUS INHALATION SOLN
10.0000 mg/h | INHALATION_SOLUTION | RESPIRATORY_TRACT | Status: DC
  Administered 2016-12-13: 10 mg/h via RESPIRATORY_TRACT
  Filled 2016-12-12: qty 20

## 2016-12-12 MED ORDER — FAMOTIDINE IN NACL 20-0.9 MG/50ML-% IV SOLN
20.0000 mg | Freq: Once | INTRAVENOUS | Status: AC
  Administered 2016-12-13: 20 mg via INTRAVENOUS
  Filled 2016-12-12: qty 50

## 2016-12-12 MED ORDER — ALBUTEROL SULFATE (2.5 MG/3ML) 0.083% IN NEBU
2.5000 mg | INHALATION_SOLUTION | Freq: Once | RESPIRATORY_TRACT | Status: AC
  Administered 2016-12-12: 2.5 mg via RESPIRATORY_TRACT
  Filled 2016-12-12: qty 3

## 2016-12-12 MED ORDER — DIPHENHYDRAMINE HCL 50 MG/ML IJ SOLN
INTRAMUSCULAR | Status: AC
  Filled 2016-12-12: qty 1

## 2016-12-12 MED ORDER — METHYLPREDNISOLONE SODIUM SUCC 125 MG IJ SOLR
125.0000 mg | Freq: Once | INTRAMUSCULAR | Status: AC
  Administered 2016-12-12: 125 mg via INTRAVENOUS
  Filled 2016-12-12: qty 2

## 2016-12-12 MED ORDER — MAGNESIUM SULFATE 2 GM/50ML IV SOLN
2.0000 g | Freq: Once | INTRAVENOUS | Status: AC
  Administered 2016-12-12: 2 g via INTRAVENOUS
  Filled 2016-12-12: qty 50

## 2016-12-12 MED ORDER — IPRATROPIUM BROMIDE 0.02 % IN SOLN
0.5000 mg | Freq: Once | RESPIRATORY_TRACT | Status: AC
  Administered 2016-12-13: 0.5 mg via RESPIRATORY_TRACT
  Filled 2016-12-12: qty 2.5

## 2016-12-12 MED ORDER — DIPHENHYDRAMINE HCL 50 MG/ML IJ SOLN
25.0000 mg | Freq: Once | INTRAMUSCULAR | Status: AC
  Administered 2016-12-12: 25 mg via INTRAVENOUS

## 2016-12-12 NOTE — ED Triage Notes (Signed)
Pt reports sob and chest tightness onset this afternoon, has used 2 neb treatments at home.

## 2016-12-12 NOTE — ED Provider Notes (Signed)
[facility] DEPT Provider Note   CSN: [id number] Arrival date & time: 12/12/16  2250     History   Chief Complaint Chief Complaint  Patient presents with  . Shortness of Breath    HPI Alexis Ortega is a 71 y.o. female.  Patient presents with a 12 hour history of worsening shortness of breath and chest tightness. History of COPD using her home nebulizer without relief. Has had chest tightness, cough productive of clear mucus and rhinorrhea. Denies any leg pain or leg swelling. Denies any cardiac history or history of heart failure. No longer smokes. Hospitalized for COPD exacerbation last year. Normally does not use her nebulizer on regular basis. Takes only albuterol for her COPD.   The history is provided by the patient.  Shortness of Breath  Associated symptoms include rhinorrhea and cough. Pertinent negatives include no fever, no headaches, no chest pain, no vomiting, no abdominal pain, no rash and no leg swelling.    Past Medical History:  Diagnosis Date  . Arthritis   . Asthma   . Depression   . Fibromyalgia   . GERD (gastroesophageal reflux disease)   . Hypertension   . Hypothyroidism   . Thyroid disease     Patient Active Problem List   Diagnosis Date Noted  . Chest pain 10/01/2013  . Asthmatic bronchitis 07/11/2012  . Cough 07/11/2012  . Tobacco abuse 07/11/2012  . HTN (hypertension) 07/11/2012  . Hypothyroidism 07/11/2012  . Syncope 07/11/2012  . Hyponatremia 07/11/2012  . Anemia 07/11/2012    Past Surgical History:  Procedure Laterality Date  . ABDOMINAL HYSTERECTOMY    . BACK SURGERY    . CARPAL TUNNEL RELEASE Right   . CHOLECYSTECTOMY    . EYE SURGERY     cataract with lens implant  . KNEE ARTHROSCOPY Left   . LEFT HEART CATHETERIZATION WITH CORONARY ANGIOGRAM N/A 10/02/2013   Procedure: LEFT HEART CATHETERIZATION WITH CORONARY ANGIOGRAM;  Surgeon: Lorretta Harp, MD;  Location: [facility] CATH LAB;  Service: Cardiovascular;  Laterality: N/A;  .  NECK SURGERY    . TOTAL KNEE ARTHROPLASTY Left 06/01/2012   Procedure: LEFT TOTAL KNEE ARTHROPLASTY;  Surgeon: Ninetta Lights, MD;  Location: [facility];  Service: Orthopedics;  Laterality: Left;  . TUBAL LIGATION      OB History    No data available       Home Medications    Prior to Admission medications   Medication Sig Start Date End Date Taking? Authorizing Provider  ALPRAZolam Duanne Moron) 0.5 MG tablet Take 0.5 mg by mouth at bedtime as needed for sleep.    [provider]  amLODipine (NORVASC) 10 MG tablet Take 10 mg by mouth daily. 03/30/14   [provider]  amLODipine (NORVASC) 5 MG tablet Take 1 tablet (5 mg total) by mouth daily. 07/14/12   Velvet Bathe, MD  aspirin EC 81 MG tablet Take 81 mg by mouth daily.    [provider]  benzonatate (TESSALON) 100 MG capsule Take 100 mg by mouth 3 (three) times daily as needed for cough.    [provider]  CALCIUM PO Take 1 tablet by mouth daily.    [provider]  citalopram (CELEXA) 10 MG tablet Take 10 mg by mouth daily. 03/10/14   [provider]  citalopram (CELEXA) 20 MG tablet Take 20 mg by mouth daily.    [provider]  cyclobenzaprine (FLEXERIL) 10 MG tablet Take 1 tablet (10 mg total) by mouth 2 (  two) times daily as needed for muscle spasms. 06/02/14   Blanchie Dessert, MD  fentaNYL (DURAGESIC - DOSED MCG/HR) 25 MCG/HR patch Place 25 mcg onto the skin every 3 (three) days.  09/12/13   [provider]  levothyroxine (SYNTHROID, LEVOTHROID) 88 MCG tablet Take 88 mcg by mouth daily before breakfast.    [provider]  losartan (COZAAR) 100 MG tablet Take 100 mg by mouth daily.    [provider]  methylPREDNIsolone (MEDROL DOSPACK) 4 MG tablet Take 4 mg by mouth as directed.  09/28/13   [provider]  nicotine (NICODERM CQ - DOSED IN MG/24 HOURS) 21 mg/24hr patch Place 21 mg onto the skin daily.    [provider]    oxyCODONE-acetaminophen (PERCOCET) 7.5-325 MG per tablet  05/08/14   [provider]  oxyCODONE-acetaminophen (PERCOCET/ROXICET) 5-325 MG per tablet Take 1-2 tablets by mouth every 4 (four) hours as needed for pain. 06/03/12   Lanae Crumbly, PA-C  PROAIR HFA 108 [phone] BASE) MCG/ACT inhaler Inhale 2 puffs into the lungs every 4 (four) hours as needed for wheezing.  08/24/13   [provider]  Umeclidinium-Vilanterol (ANORO ELLIPTA) 62.5-25 MCG/INH AEPB Inhale 1 puff into the lungs daily.    [provider]    Family History Family History  Problem Relation Age of Onset  . Allergies Son   . Asthma Daughter     Social History Social History  Substance Use Topics  . Smoking status: Former Smoker    Packs/day: 1.00    Years: 40.00    Types: Cigarettes    Quit date: 06/14/2012  . Smokeless tobacco: Never Used  . Alcohol use No     Allergies   Tramadol   Review of Systems Review of Systems  Constitutional: Negative for activity change, appetite change, fatigue and fever.  HENT: Positive for congestion and rhinorrhea.   Respiratory: Positive for cough, chest tightness and shortness of breath.   Cardiovascular: Negative for chest pain and leg swelling.  Gastrointestinal: Negative for abdominal pain, nausea and vomiting.  Genitourinary: Negative for vaginal bleeding and vaginal discharge.  Musculoskeletal: Negative for arthralgias and myalgias.  Skin: Negative for rash.  Neurological: Negative for dizziness, syncope and headaches.    all other systems are negative except as noted in the HPI and PMH.   Physical Exam Updated Vital Signs BP 140/62 (BP Location: Left Arm)   Pulse 74   Temp 97.9 F (36.6 C) (Oral)   Resp (!) 26   Ht 5\' 4"  (1.626 m)   Wt 72.1 kg (159 lb)   SpO2 98%   BMI 27.29 kg/m   Physical Exam  Constitutional: She is oriented to person, place, and time. She appears well-developed and well-nourished. She appears distressed.   Speaking in short sentences  HENT:  Head: Normocephalic and atraumatic.  Mouth/Throat: Oropharynx is clear and moist. No oropharyngeal exudate.  Eyes: Pupils are equal, round, and reactive to light. Conjunctivae and EOM are normal.  Neck: Normal range of motion. Neck supple.  No meningismus.  Cardiovascular: Normal rate, regular rhythm, normal heart sounds and intact distal pulses.   No murmur heard. Pulmonary/Chest: Effort normal. No respiratory distress. She has wheezes.  Diffuse inspiratory and expiratory wheezing  Abdominal: Soft. There is no tenderness. There is no rebound and no guarding.  Musculoskeletal: Normal range of motion. She exhibits no edema or tenderness.  Neurological: She is alert and oriented to person, place, and time. No cranial nerve deficit. She exhibits  normal muscle tone. Coordination normal.   5/5 strength throughout. CN 2-12 intact.Equal grip strength.   Skin: Skin is warm.  Psychiatric: She has a normal mood and affect. Her behavior is normal.  Nursing note and vitals reviewed.    ED Treatments / Results  Labs (all labs ordered are listed, but only abnormal results are displayed) Labs Reviewed  CBC WITH DIFFERENTIAL/PLATELET - Abnormal; Notable for the following:       Result Value   Eosinophils Absolute 1.3 (*)    All other components within normal limits  BASIC METABOLIC PANEL - Abnormal; Notable for the following:    Glucose, Bld 125 (*)    Creatinine, Ser 1.02 (*)    GFR calc non Af Amer 54 (*)    All other components within normal limits  TROPONIN I  I-STAT TROPONIN, ED    EKG  EKG Interpretation  Date/Time:  Saturday December 12 2016 23:16:58 EDT Ventricular Rate:  74 PR Interval:  162 QRS Duration: 76 QT Interval:  400 QTC Calculation: 444 R Axis:   64 Text Interpretation:  Normal sinus rhythm Normal ECG No significant change was found Confirmed by Ezequiel Essex [phone]) on 12/12/2016 11:22:29 PM       Radiology Dg Chest 2  View  Result Date: 12/13/2016 CLINICAL DATA:  71 y/o  F; shortness of breath. EXAM: CHEST  2 VIEW COMPARISON:  12/01/2012 chest radiograph FINDINGS: Normal cardiac silhouette. Aortic atherosclerosis with calcification. Clear lungs. No pleural effusion or pneumothorax. Anterior cervical discectomy and fusion hardware noted. Bones are unremarkable. IMPRESSION: No acute pulmonary process identified.  The aortic atherosclerosis. Electronically Signed   By: Kristine Garbe M.D.   On: 12/13/2016 00:27    Procedures Procedures (including critical care time)  Medications Ordered in ED Medications  methylPREDNISolone sodium succinate (SOLU-MEDROL) 125 mg/2 mL injection 125 mg (not administered)  albuterol (PROVENTIL,VENTOLIN) solution continuous neb (not administered)  ipratropium (ATROVENT) nebulizer solution 0.5 mg (not administered)  magnesium sulfate IVPB 2 g 50 mL (not administered)  ipratropium-albuterol (DUONEB) 0.5-2.5 (3) MG/3ML nebulizer solution 3 mL (3 mLs Nebulization Given 12/12/16 2312)  albuterol (PROVENTIL) (2.5 MG/3ML) 0.083% nebulizer solution 2.5 mg (2.5 mg Nebulization Given 12/12/16 2312)     Initial Impression / Assessment and Plan / ED Course  I have reviewed the triage vital signs and the nursing notes.  Pertinent labs & imaging results that were available during my care of the patient were reviewed by me and considered in my medical decision making (see chart for details).    SOB and chest tightness since this afternoon. History of COPD.  Wheezing on exam, speaking in short sentences.  Bronchodilators, steroids, magnesium EKG nsr. Clean coronaries on cath in 2015.  Patient developed diffuse itching immediately after solu- Medrol infusion. No tongue or lip swelling. Given Benadryl and Pepcid.  Chest x-ray shows no pneumonia. Labs unremarkable. Troponin negative. Low suspicion for ACS or pulmonary embolism.   Patient much improved after continuous nebulizer,  magnesium and steroids. She is able to ambulate and maintain her oxygenation is 93% but does become short of breath.   Air exchange is fair. Patient given option of going home but prefers to be observed as she still feels short of breath and is coughing. Observation admission discussed with Dr. Shanon Brow.  Final Clinical Impressions(s) / ED Diagnoses   Final diagnoses:  COPD exacerbation ([place])    New Prescriptions New Prescriptions   No medications on file     Ezequiel Essex, MD 12/13/16  0333  

## 2016-12-13 ENCOUNTER — Encounter (HOSPITAL_COMMUNITY): Payer: Self-pay | Admitting: *Deleted

## 2016-12-13 DIAGNOSIS — Z720 Tobacco use: Secondary | ICD-10-CM

## 2016-12-13 DIAGNOSIS — J441 Chronic obstructive pulmonary disease with (acute) exacerbation: Principal | ICD-10-CM | POA: Diagnosis present

## 2016-12-13 DIAGNOSIS — I10 Essential (primary) hypertension: Secondary | ICD-10-CM

## 2016-12-13 DIAGNOSIS — R0602 Shortness of breath: Secondary | ICD-10-CM | POA: Diagnosis not present

## 2016-12-13 LAB — MRSA PCR SCREENING: MRSA BY PCR: NEGATIVE

## 2016-12-13 MED ORDER — DOXYCYCLINE HYCLATE 100 MG PO TABS: 100.0000 mg | ORAL_TABLET | Freq: Two times a day (BID) | ORAL | Status: DC

## 2016-12-13 MED ORDER — PREDNISONE 20 MG PO TABS: 40.0000 mg | ORAL_TABLET | Freq: Every day | ORAL | 0 refills | Status: AC

## 2016-12-13 MED ORDER — IPRATROPIUM-ALBUTEROL 0.5-2.5 (3) MG/3ML IN SOLN
3.0000 mL | Freq: Four times a day (QID) | RESPIRATORY_TRACT | Status: DC
  Administered 2016-12-13: 3 mL via RESPIRATORY_TRACT
  Filled 2016-12-13: qty 3

## 2016-12-13 MED ORDER — IPRATROPIUM BROMIDE 0.02 % IN SOLN: 0.5000 mg | Freq: Four times a day (QID) | RESPIRATORY_TRACT | Status: DC

## 2016-12-13 MED ORDER — SODIUM CHLORIDE 0.9% FLUSH: 3.0000 mL | INTRAVENOUS | Status: DC | PRN

## 2016-12-13 MED ORDER — SODIUM CHLORIDE 0.9% FLUSH
3.0000 mL | Freq: Two times a day (BID) | INTRAVENOUS | Status: DC
  Administered 2016-12-13: 3 mL via INTRAVENOUS

## 2016-12-13 MED ORDER — GUAIFENESIN ER 600 MG PO TB12: 600.0000 mg | ORAL_TABLET | Freq: Two times a day (BID) | ORAL | 0 refills | Status: AC

## 2016-12-13 MED ORDER — GUAIFENESIN ER 600 MG PO TB12
600.0000 mg | ORAL_TABLET | Freq: Two times a day (BID) | ORAL | Status: DC
  Administered 2016-12-13: 600 mg via ORAL
  Filled 2016-12-13: qty 1

## 2016-12-13 MED ORDER — PREDNISONE 20 MG PO TABS
20.0000 mg | ORAL_TABLET | Freq: Two times a day (BID) | ORAL | Status: DC
  Administered 2016-12-13: 20 mg via ORAL
  Filled 2016-12-13: qty 1

## 2016-12-13 MED ORDER — DOXYCYCLINE HYCLATE 100 MG PO TABS
100.0000 mg | ORAL_TABLET | Freq: Once | ORAL | Status: AC
  Administered 2016-12-13: 100 mg via ORAL
  Filled 2016-12-13: qty 1

## 2016-12-13 MED ORDER — ALBUTEROL SULFATE (2.5 MG/3ML) 0.083% IN NEBU: 2.5000 mg | INHALATION_SOLUTION | Freq: Four times a day (QID) | RESPIRATORY_TRACT | Status: DC

## 2016-12-13 MED ORDER — SODIUM CHLORIDE 0.9 % IV SOLN: 250.0000 mL | INTRAVENOUS | Status: DC | PRN

## 2016-12-13 MED ORDER — DOXYCYCLINE HYCLATE 100 MG PO TABS: 100.0000 mg | ORAL_TABLET | Freq: Two times a day (BID) | ORAL | 0 refills | Status: AC

## 2016-12-13 MED ORDER — ALBUTEROL SULFATE (2.5 MG/3ML) 0.083% IN NEBU: 2.5000 mg | INHALATION_SOLUTION | RESPIRATORY_TRACT | Status: DC | PRN

## 2016-12-13 MED ORDER — BENZONATATE 100 MG PO CAPS
100.0000 mg | ORAL_CAPSULE | Freq: Once | ORAL | Status: AC
  Administered 2016-12-13: 100 mg via ORAL
  Filled 2016-12-13: qty 1

## 2016-12-13 NOTE — Discharge Summary (Signed)
Physician Discharge Summary  Alexis Ortega OYD:[id number] DOB: [birth year] DOA: 12/12/2016  PCP: Neale Burly, MD  Admit date: 12/12/2016 Discharge date: 12/13/2016  Admitted From: Home  Disposition: Home   Recommendations for Outpatient Follow-up:  1. Follow up with PCP in 1 weeks  Discharge Condition: STABLE   CODE STATUS: FULL    Brief Hospitalization Summary: Please see all hospital notes, images, labs for full details of the hospitalization. HPI: Alexis Ortega is a 71 y.o. female with medical history significant of copd comes in with over a day of wheezing at home not better with her albuterol inhaler at home.  Some dry cough.  No fevers.  No leg swelling or edema.  No n/v/d.  No recent abx.  Pt got solumedrol in EMS which she says made her itch so it has been added to her allergy list.   Pt was admitted for observation, given oral prednisone and neb treatments and improved considerably.  She is stable for discharge home with follow up with her PCP.  She had an apparent reaction to solumedrol but has tolerated prednisone.  She will discharge on prednisone taper and oral doxycycline x 7 days.  Counseled patient on tobacco cessation and dangers of ongoing smoking.   Discharge Diagnoses:  Principal Problem:   COPD exacerbation ([place]) Active Problems:   Tobacco abuse   HTN (hypertension)  Discharge Instructions: Discharge Instructions    Call MD for:  difficulty breathing, headache or visual disturbances    Complete by:  As directed    Call MD for:  extreme fatigue    Complete by:  As directed    Call MD for:  hives    Complete by:  As directed    Call MD for:  persistant dizziness or light-headedness    Complete by:  As directed    Call MD for:  persistant nausea and vomiting    Complete by:  As directed    Call MD for:  severe uncontrolled pain    Complete by:  As directed    Call MD for:  temperature >100.4    Complete by:  As directed      Allergies as of 12/13/2016       Reactions   Solu-medrol [methylprednisolone]    Rash, itching, difficulty breathing   Tramadol Itching      Medication List    STOP taking these medications   fentaNYL 50 MCG/HR Commonly known as:  DURAGESIC - dosed mcg/hr   nicotine 21 mg/24hr patch Commonly known as:  NICODERM CQ - dosed in mg/24 hours   oxyCODONE-acetaminophen 7.5-325 MG tablet Commonly known as:  PERCOCET     TAKE these medications   alendronate 70 MG tablet Commonly known as:  FOSAMAX Take 70 mg by mouth once a week. Take with a full glass of water on an empty stomach.   ALPRAZolam 0.5 MG tablet Commonly known as:  XANAX Take 0.5 mg by mouth at bedtime as needed for sleep.   amLODipine 10 MG tablet Commonly known as:  NORVASC Take 10 mg by mouth daily.   ANORO ELLIPTA 62.5-25 MCG/INH Aepb Generic drug:  umeclidinium-vilanterol Inhale 1 puff into the lungs daily.   aspirin EC 81 MG tablet Take 81 mg by mouth daily.   benzonatate 100 MG capsule Commonly known as:  TESSALON Take 100 mg by mouth 3 (three) times daily as needed for cough.   CALCIUM PO Take 1 tablet by mouth daily.   citalopram 20 MG  tablet Commonly known as:  CELEXA Take 20 mg by mouth daily.   cyclobenzaprine 10 MG tablet Commonly known as:  FLEXERIL Take 1 tablet (10 mg total) by mouth 2 (two) times daily as needed for muscle spasms.   doxycycline 100 MG tablet Commonly known as:  VIBRA-TABS Take 1 tablet (100 mg total) by mouth every 12 (twelve) hours.   guaiFENesin 600 MG 12 hr tablet Commonly known as:  MUCINEX Take 1 tablet (600 mg total) by mouth 2 (two) times daily.   Levothyroxine Sodium 100 MCG Caps Take 100 mcg by mouth daily before breakfast.   losartan 100 MG tablet Commonly known as:  COZAAR Take 100 mg by mouth daily.   predniSONE 20 MG tablet Commonly known as:  DELTASONE Take 2 tablets (40 mg total) by mouth daily with breakfast.   PROAIR HFA 108 (90 Base) MCG/ACT inhaler Generic drug:   albuterol Inhale 2 puffs into the lungs every 4 (four) hours as needed for wheezing.            Discharge Care Instructions        Start     Ordered   12/13/16 0000  doxycycline (VIBRA-TABS) 100 MG tablet  Every 12 hours     12/13/16 0944   12/13/16 0000  guaiFENesin (MUCINEX) 600 MG 12 hr tablet  2 times daily     12/13/16 0944   12/13/16 0000  predniSONE (DELTASONE) 20 MG tablet  Daily with breakfast     12/13/16 0944   12/13/16 0000  Call MD for:  temperature >100.4     12/13/16 0944   12/13/16 0000  Call MD for:  persistant nausea and vomiting     12/13/16 0944   12/13/16 0000  Call MD for:  severe uncontrolled pain     12/13/16 0944   12/13/16 0000  Call MD for:  difficulty breathing, headache or visual disturbances     12/13/16 0944   12/13/16 0000  Call MD for:  hives     12/13/16 0944   12/13/16 0000  Call MD for:  persistant dizziness or light-headedness     12/13/16 0944   12/13/16 0000  Call MD for:  extreme fatigue     12/13/16 0944     Follow-up Information    Neale Burly, MD. Schedule an appointment as soon as possible for a visit in 1 week(s).   Specialty:  Internal Medicine Why:  Hospital Follow Up Contact information: [place] [id number] [phone]          Allergies  Allergen Reactions  . Solu-Medrol [Methylprednisolone]     Rash, itching, difficulty breathing  . Tramadol Itching   Current Discharge Medication List    START taking these medications   Details  doxycycline (VIBRA-TABS) 100 MG tablet Take 1 tablet (100 mg total) by mouth every 12 (twelve) hours. Qty: 12 tablet, Refills: 0    guaiFENesin (MUCINEX) 600 MG 12 hr tablet Take 1 tablet (600 mg total) by mouth 2 (two) times daily. Qty: 14 tablet, Refills: 0    predniSONE (DELTASONE) 20 MG tablet Take 2 tablets (40 mg total) by mouth daily with breakfast. Qty: 14 tablet, Refills: 0      CONTINUE these medications which have NOT CHANGED   Details   alendronate (FOSAMAX) 70 MG tablet Take 70 mg by mouth once a week. Take with a full glass of water on an empty stomach.    ALPRAZolam (XANAX) 0.5  MG tablet Take 0.5 mg by mouth at bedtime as needed for sleep.    amLODipine (NORVASC) 10 MG tablet Take 10 mg by mouth daily.    aspirin EC 81 MG tablet Take 81 mg by mouth daily.    citalopram (CELEXA) 20 MG tablet Take 20 mg by mouth daily.    Levothyroxine Sodium 100 MCG CAPS Take 100 mcg by mouth daily before breakfast.     losartan (COZAAR) 100 MG tablet Take 100 mg by mouth daily.    PROAIR HFA 108 (90 BASE) MCG/ACT inhaler Inhale 2 puffs into the lungs every 4 (four) hours as needed for wheezing.     benzonatate (TESSALON) 100 MG capsule Take 100 mg by mouth 3 (three) times daily as needed for cough.    CALCIUM PO Take 1 tablet by mouth daily.    cyclobenzaprine (FLEXERIL) 10 MG tablet Take 1 tablet (10 mg total) by mouth 2 (two) times daily as needed for muscle spasms. Qty: 20 tablet, Refills: 0    Umeclidinium-Vilanterol (ANORO ELLIPTA) 62.5-25 MCG/INH AEPB Inhale 1 puff into the lungs daily.      STOP taking these medications     fentaNYL (DURAGESIC - DOSED MCG/HR) 50 MCG/HR      nicotine (NICODERM CQ - DOSED IN MG/24 HOURS) 21 mg/24hr patch      oxyCODONE-acetaminophen (PERCOCET) 7.5-325 MG per tablet         Procedures/Studies: Dg Chest 2 View  Result Date: 12/13/2016 CLINICAL DATA:  71 y/o  F; shortness of breath. EXAM: CHEST  2 VIEW COMPARISON:  12/01/2012 chest radiograph FINDINGS: Normal cardiac silhouette. Aortic atherosclerosis with calcification. Clear lungs. No pleural effusion or pneumothorax. Anterior cervical discectomy and fusion hardware noted. Bones are unremarkable. IMPRESSION: No acute pulmonary process identified.  The aortic atherosclerosis. Electronically Signed   By: Kristine Garbe M.D.   On: 12/13/2016 00:27      Subjective: Pt breathing much better today.    Discharge  Exam: Vitals:   12/13/16 0459 12/13/16 0851  BP: (!) 120/56   Pulse: 88   Resp: 18   Temp: 97.7 F (36.5 C)   SpO2: 97% 93%   Vitals:   12/13/16 0330 12/13/16 0400 12/13/16 0459 12/13/16 0851  BP: (!) 105/44 (!) 110/49 (!) 120/56   Pulse: 86 83 88   Resp: 19 17 18    Temp:   97.7 F (36.5 C)   TempSrc:   Oral   SpO2: 95% 92% 97% 93%  Weight:      Height:        General: Pt is alert, awake, not in acute distress Cardiovascular: RRR, S1/S2 +, no rubs, no gallops Respiratory: CTA bilaterally with few exp wheezes heard Abdominal: Soft, NT, ND, bowel sounds + Extremities: no edema, no cyanosis   The results of significant diagnostics from this hospitalization (including imaging, microbiology, ancillary and laboratory) are listed below for reference.     Microbiology: No results found for this or any previous visit (from the past 240 hour(s)).   Labs: BNP (last 3 results) No results for input(s): BNP in the last 8760 hours. Basic Metabolic Panel:  Recent Labs Lab 12/12/16 2309  NA 138  K 3.7  CL 102  CO2 26  GLUCOSE 125*  BUN 16  CREATININE 1.02*  CALCIUM 9.1   Liver Function Tests: No results for input(s): AST, ALT, ALKPHOS, BILITOT, PROT, ALBUMIN in the last 168 hours. No results for input(s): LIPASE, AMYLASE in the last 168 hours. No results  for input(s): AMMONIA in the last 168 hours. CBC:  Recent Labs Lab 12/12/16 2309  WBC 10.3  NEUTROABS 5.6  HGB 12.4  HCT 36.4  MCV 91.9  PLT 262   Cardiac Enzymes:  Recent Labs Lab 12/12/16 2309  TROPONINI <0.03   BNP: Invalid input(s): POCBNP CBG: No results for input(s): GLUCAP in the last 168 hours. D-Dimer No results for input(s): DDIMER in the last 72 hours. Hgb A1c No results for input(s): HGBA1C in the last 72 hours. Lipid Profile No results for input(s): CHOL, HDL, LDLCALC, TRIG, CHOLHDL, LDLDIRECT in the last 72 hours. Thyroid function studies No results for input(s): TSH, T4TOTAL, T3FREE,  THYROIDAB in the last 72 hours.  Invalid input(s): FREET3 Anemia work up No results for input(s): VITAMINB12, FOLATE, FERRITIN, TIBC, IRON, RETICCTPCT in the last 72 hours. Urinalysis    Component Value Date/Time   COLORURINE YELLOW 07/11/2012 1613   APPEARANCEUR CLEAR 07/11/2012 1613   LABSPEC 1.015 07/11/2012 1613   PHURINE 6.5 07/11/2012 1613   GLUCOSEU NEGATIVE 07/11/2012 1613   HGBUR NEGATIVE 07/11/2012 1613   BILIRUBINUR NEGATIVE 07/11/2012 1613   KETONESUR NEGATIVE 07/11/2012 1613   PROTEINUR NEGATIVE 07/11/2012 1613   UROBILINOGEN 0.2 07/11/2012 1613   NITRITE NEGATIVE 07/11/2012 1613   LEUKOCYTESUR NEGATIVE 07/11/2012 1613   Sepsis Labs Invalid input(s): PROCALCITONIN,  WBC,  LACTICIDVEN Microbiology No results found for this or any previous visit (from the past 240 hour(s)).  Time coordinating discharge:   SIGNED:  Irwin Brakeman, MD  [facility] 12/13/2016, 9:51 AM Pager [phone]  If 7PM-7AM, please contact night-coverage [email or web address] Password [email or web address]

## 2016-12-13 NOTE — ED Notes (Signed)
Walked pt with pulse oximetry on, oxygen started out at 96% in room with nasal cannula going,oxygen dropped to 93% without any oxygen going while walking.

## 2016-12-13 NOTE — H&P (Signed)
History and Physical    Alexis Ortega OVZ:[id number] DOB: Jun 03, 1945 DOA: 12/12/2016  PCP: Neale Burly, MD  Patient coming from:  home  Chief Complaint:   sob  HPI: Alexis Ortega is a 71 y.o. female with medical history significant of copd comes in with over a day of wheezing at home not better with her albuterol inhaler at home.  Some dry cough.  No fevers.  No leg swelling or edema.  No n/v/d.  No recent abx.  Pt got solumedrol in EMS which she says made her itch so it has been added to her allergy list.  Referred for admission for copde.   Review of Systems: As per HPI otherwise 10 point review of systems negative.   Past Medical History:  Diagnosis Date  . Arthritis   . Asthma   . Depression   . Fibromyalgia   . GERD (gastroesophageal reflux disease)   . Hypertension   . Hypothyroidism   . Thyroid disease     Past Surgical History:  Procedure Laterality Date  . ABDOMINAL HYSTERECTOMY    . BACK SURGERY    . CARPAL TUNNEL RELEASE Right   . CHOLECYSTECTOMY    . EYE SURGERY     cataract with lens implant  . KNEE ARTHROSCOPY Left   . LEFT HEART CATHETERIZATION WITH CORONARY ANGIOGRAM N/A 10/02/2013   Procedure: LEFT HEART CATHETERIZATION WITH CORONARY ANGIOGRAM;  Surgeon: Lorretta Harp, MD;  Location: [facility] CATH LAB;  Service: Cardiovascular;  Laterality: N/A;  . NECK SURGERY    . TOTAL KNEE ARTHROPLASTY Left 06/01/2012   Procedure: LEFT TOTAL KNEE ARTHROPLASTY;  Surgeon: Ninetta Lights, MD;  Location: [facility];  Service: Orthopedics;  Laterality: Left;  . TUBAL LIGATION       reports that she quit smoking about 4 years ago. Her smoking use included Cigarettes. She has a 40.00 pack-year smoking history. She has never used smokeless tobacco. She reports that she does not drink alcohol or use drugs.  Allergies  Allergen Reactions  . Solu-Medrol [Methylprednisolone]     Rash, itching, difficulty breathing  . Tramadol Itching    Family History  Problem Relation Age  of Onset  . Allergies Son   . Asthma Daughter     Prior to Admission medications   Medication Sig Start Date End Date Taking? Authorizing Provider  alendronate (FOSAMAX) 70 MG tablet Take 70 mg by mouth once a week. Take with a full glass of water on an empty stomach.   Yes [provider]  ALPRAZolam Duanne Moron) 0.5 MG tablet Take 0.5 mg by mouth at bedtime as needed for sleep.   Yes [provider]  amLODipine (NORVASC) 10 MG tablet Take 10 mg by mouth daily. 03/30/14  Yes [provider]  aspirin EC 81 MG tablet Take 81 mg by mouth daily.   Yes [provider]  citalopram (CELEXA) 20 MG tablet Take 20 mg by mouth daily.   Yes [provider]  fentaNYL (DURAGESIC - DOSED MCG/HR) 50 MCG/HR Place 50 mcg onto the skin every 3 (three) days.  09/12/13  Yes [provider]  Levothyroxine Sodium 100 MCG CAPS Take 100 mcg by mouth daily before breakfast.    Yes [provider]  losartan (COZAAR) 100 MG tablet Take 100 mg by mouth daily.   Yes [provider]  PROAIR HFA 108 (90 BASE) MCG/ACT inhaler Inhale 2 puffs into the lungs every 4 (four) hours as needed for wheezing.  08/24/13  Yes [provider]  benzonatate (TESSALON) 100 MG capsule Take 100 mg by mouth 3 (three) times daily as needed for cough.    [provider]  CALCIUM PO Take 1 tablet by mouth daily.    [provider]  cyclobenzaprine (FLEXERIL) 10 MG tablet Take 1 tablet (10 mg total) by mouth 2 (two) times daily as needed for muscle spasms. 06/02/14   Blanchie Dessert, MD  nicotine (NICODERM CQ - DOSED IN MG/24 HOURS) 21 mg/24hr patch Place 21 mg onto the skin daily.    [provider]  oxyCODONE-acetaminophen (PERCOCET) 7.5-325 MG per tablet  05/08/14   [provider]  Umeclidinium-Vilanterol (ANORO ELLIPTA) 62.5-25 MCG/INH AEPB Inhale 1 puff into the lungs daily.    [provider]    Physical Exam: Vitals:    12/13/16 0145 12/13/16 0200 12/13/16 0230 12/13/16 0300  BP:  (!) 121/57 (!) 120/53 (!) 125/52  Pulse: 85 85 90 91  Resp: 20 (!) 24 (!) 21 19  Temp:      TempSrc:      SpO2: 99% 96% 94% 97%  Weight:      Height:        Constitutional: NAD, calm, comfortable Vitals:   12/13/16 0145 12/13/16 0200 12/13/16 0230 12/13/16 0300  BP:  (!) 121/57 (!) 120/53 (!) 125/52  Pulse: 85 85 90 91  Resp: 20 (!) 24 (!) 21 19  Temp:      TempSrc:      SpO2: 99% 96% 94% 97%  Weight:      Height:       Eyes: PERRL, lids and conjunctivae normal ENMT: Mucous membranes are moist. Posterior pharynx clear of any exudate or lesions.Normal dentition.  Neck: normal, supple, no masses, no thyromegaly Respiratory: clear to auscultation bilaterally, mild bilateral wheezing, no crackles. Normal respiratory effort. No accessory muscle use.  Cardiovascular: Regular rate and rhythm, no murmurs / rubs / gallops. No extremity edema. 2+ pedal pulses. No carotid bruits.  Abdomen: no tenderness, no masses palpated. No hepatosplenomegaly. Bowel sounds positive.  Musculoskeletal: no clubbing / cyanosis. No joint deformity upper and lower extremities. Good ROM, no contractures. Normal muscle tone.  Skin: no rashes, lesions, ulcers. No induration Neurologic: CN 2-12 grossly intact. Sensation intact, DTR normal. Strength 5/5 in all 4.  Psychiatric: Normal judgment and insight. Alert and oriented x 3. Normal mood.    Labs on Admission: I have personally reviewed following labs and imaging studies  CBC:  Recent Labs Lab 12/12/16 2309  WBC 10.3  NEUTROABS 5.6  HGB 12.4  HCT 36.4  MCV 91.9  PLT 154   Basic Metabolic Panel:  Recent Labs Lab 12/12/16 2309  NA 138  K 3.7  CL 102  CO2 26  GLUCOSE 125*  BUN 16  CREATININE 1.02*  CALCIUM 9.1   GFR: Estimated Creatinine Clearance: 49.3 mL/min (A) (by C-G formula based on SCr of 1.02 mg/dL (H)).  Cardiac Enzymes:  Recent Labs Lab 12/12/16 2309  TROPONINI  <0.03    Radiological Exams on Admission: Dg Chest 2 View  Result Date: 12/13/2016 CLINICAL DATA:  71 y/o  F; shortness of breath. EXAM: CHEST  2 VIEW COMPARISON:  12/01/2012 chest radiograph FINDINGS: Normal cardiac silhouette. Aortic atherosclerosis with calcification. Clear lungs. No pleural effusion or pneumothorax. Anterior cervical discectomy and fusion hardware noted. Bones are unremarkable. IMPRESSION: No acute pulmonary process identified.  The aortic atherosclerosis. Electronically Signed   By: Kristine Garbe M.D.   On:  12/13/2016 00:27     Assessment/Plan 71 yo female with mild copde  Principal Problem:   COPD exacerbation ([place])- cxr neg.  Oral prednisone.  freq nebs.  Po doxy.  Afvss, normal oxygen sats.  Active Problems:   Tobacco abuse- she quit this in jan 2018   HTN (hypertension)- stable  Clarify home meds  DVT prophylaxis:  scds Code Status:  full Family Communication:  none  Disposition Plan:  Per day team Consults called:  none Admission status:   observation   DAVID,RACHAL A MD [facility]  If 7PM-7AM, please contact night-coverage [email or web address] Password [email or web address]  12/13/2016, 4:15 AM

## 2016-12-13 NOTE — Progress Notes (Signed)
Patient discharged home with IV removed and site intact. Patient discharged with personal belongings and prescriptions sent to [facility].

## 2016-12-13 NOTE — ED Notes (Signed)
Pt c/o itching to hands, feet and back with more difficulty breathing; Dr. Wyvonnia Dusky notified and orders given and carried out

## 2016-12-13 NOTE — ED Notes (Signed)
Pt back from xray and states she feels alittle better and decreased effort in breathing

## 2016-12-13 NOTE — Discharge Instructions (Signed)
Follow with Primary MD  Neale Burly, MD  and other consultant's as instructed your Hospitalist MD  Please get a complete blood count and chemistry panel checked by your Primary MD at your next visit, and again as instructed by your Primary MD.  Get Medicines reviewed and adjusted: Please take all your medications with you for your next visit with your Primary MD  Laboratory/radiological data: Please request your Primary MD to go over all hospital tests and procedure/radiological results at the follow up, please ask your Primary MD to get all Hospital records sent to his/her office.  In some cases, they will be blood work, cultures and biopsy results pending at the time of your discharge. Please request that your primary care M.D. follows up on these results.  Also Note the following: If you experience worsening of your admission symptoms, develop shortness of breath, life threatening emergency, suicidal or homicidal thoughts you must seek medical attention immediately by calling 911 or calling your MD immediately  if symptoms less severe.  You must read complete instructions/literature along with all the possible adverse reactions/side effects for all the Medicines you take and that have been prescribed to you. Take any new Medicines after you have completely understood and accpet all the possible adverse reactions/side effects.   Do not drive when taking Pain medications or sleeping medications (Benzodaizepines)  Do not take more than prescribed Pain, Sleep and Anxiety Medications. It is not advisable to combine anxiety,sleep and pain medications without talking with your primary care practitioner  Special Instructions: If you have smoked or chewed Tobacco  in the last 2 yrs please stop smoking, stop any regular Alcohol  and or any Recreational drug use.  Wear Seat belts while driving.  Please note: You were cared for by a hospitalist during your hospital stay. Once you are discharged,  your primary care physician will handle any further medical issues. Please note that NO REFILLS for any discharge medications will be authorized once you are discharged, as it is imperative that you return to your primary care physician (or establish a relationship with a primary care physician if you do not have one) for your post hospital discharge needs so that they can reassess your need for medications and monitor your lab values.

## 2016-12-13 NOTE — ED Notes (Signed)
Report given to [facility] on 300

## 2016-12-16 ENCOUNTER — Encounter (HOSPITAL_COMMUNITY): Payer: Self-pay

## 2016-12-16 ENCOUNTER — Emergency Department (HOSPITAL_COMMUNITY): Payer: Medicare Other

## 2016-12-16 ENCOUNTER — Emergency Department (HOSPITAL_COMMUNITY)
Admission: EM | Admit: 2016-12-16 | Discharge: 2016-12-17 | Disposition: A | Discharge status: Home / Self Care | Payer: Medicare Other | Attending: Emergency Medicine | Admitting: Emergency Medicine

## 2016-12-16 DIAGNOSIS — Z87891 Personal history of nicotine dependence: Secondary | ICD-10-CM | POA: Diagnosis not present

## 2016-12-16 DIAGNOSIS — I10 Essential (primary) hypertension: Secondary | ICD-10-CM | POA: Insufficient documentation

## 2016-12-16 DIAGNOSIS — J45909 Unspecified asthma, uncomplicated: Secondary | ICD-10-CM | POA: Diagnosis not present

## 2016-12-16 DIAGNOSIS — Z79899 Other long term (current) drug therapy: Secondary | ICD-10-CM | POA: Insufficient documentation

## 2016-12-16 DIAGNOSIS — Z7982 Long term (current) use of aspirin: Secondary | ICD-10-CM | POA: Insufficient documentation

## 2016-12-16 DIAGNOSIS — E039 Hypothyroidism, unspecified: Secondary | ICD-10-CM | POA: Insufficient documentation

## 2016-12-16 DIAGNOSIS — J449 Chronic obstructive pulmonary disease, unspecified: Secondary | ICD-10-CM | POA: Insufficient documentation

## 2016-12-16 DIAGNOSIS — R079 Chest pain, unspecified: Secondary | ICD-10-CM | POA: Insufficient documentation

## 2016-12-16 DIAGNOSIS — R072 Precordial pain: Secondary | ICD-10-CM | POA: Diagnosis not present

## 2016-12-16 LAB — CBC WITH DIFFERENTIAL/PLATELET
BASOS ABS: 0 10*3/uL (ref 0.0–0.1)
Basophils Relative: 0 %
EOS ABS: 0 10*3/uL (ref 0.0–0.7)
EOS PCT: 0 %
HCT: 39.6 % (ref 36.0–46.0)
Hemoglobin: 13.3 g/dL (ref 12.0–15.0)
Lymphocytes Relative: 16 %
Lymphs Abs: 2.2 10*3/uL (ref 0.7–4.0)
MCH: 30.8 pg (ref 26.0–34.0)
MCHC: 33.6 g/dL (ref 30.0–36.0)
MCV: 91.7 fL (ref 78.0–100.0)
Monocytes Absolute: 1.2 10*3/uL — ABNORMAL HIGH (ref 0.1–1.0)
Monocytes Relative: 9 %
Neutro Abs: 10.5 10*3/uL — ABNORMAL HIGH (ref 1.7–7.7)
Neutrophils Relative %: 75 %
PLATELETS: 309 10*3/uL (ref 150–400)
RBC: 4.32 MIL/uL (ref 3.87–5.11)
RDW: 14.2 % (ref 11.5–15.5)
WBC: 13.8 10*3/uL — AB (ref 4.0–10.5)

## 2016-12-16 LAB — BASIC METABOLIC PANEL
ANION GAP: 9 (ref 5–15)
BUN: 21 mg/dL — AB (ref 6–20)
CO2: 28 mmol/L (ref 22–32)
Calcium: 9.4 mg/dL (ref 8.9–10.3)
Chloride: 94 mmol/L — ABNORMAL LOW (ref 101–111)
Creatinine, Ser: 1.09 mg/dL — ABNORMAL HIGH (ref 0.44–1.00)
GFR calc Af Amer: 58 mL/min — ABNORMAL LOW (ref >60)
GFR, EST NON AFRICAN AMERICAN: 50 mL/min — AB (ref >60)
Glucose, Bld: 115 mg/dL — ABNORMAL HIGH (ref 65–99)
POTASSIUM: 4.3 mmol/L (ref 3.5–5.1)
SODIUM: 131 mmol/L — AB (ref 135–145)

## 2016-12-16 LAB — TROPONIN I

## 2016-12-16 NOTE — ED Triage Notes (Signed)
Pt reports she ate some cereal this morning and started having chest pain.  Reports she called her doctor and he wasn't in the office but he called her in some nitro.  Reports she took 1 at 3pm and it eased the pain then again at 9pm tonight because pain came back.  Reports nitro helped again.  Pt says now feels like a "pinching" pain in center of chest.  Pt says when pain gets bad, it radiates into jaws.

## 2016-12-16 NOTE — ED Notes (Signed)
Pt refuses to go to xray at this time; wants to see the EDP first; Dr. Stark Jock notified

## 2016-12-17 ENCOUNTER — Emergency Department (HOSPITAL_COMMUNITY): Payer: Medicare Other

## 2016-12-17 DIAGNOSIS — R079 Chest pain, unspecified: Secondary | ICD-10-CM | POA: Diagnosis not present

## 2016-12-17 LAB — I-STAT TROPONIN, ED: TROPONIN I, POC: 0 ng/mL (ref 0.00–0.08)

## 2016-12-17 MED ORDER — MORPHINE SULFATE (PF) 4 MG/ML IV SOLN
4.0000 mg | Freq: Once | INTRAVENOUS | Status: AC
  Administered 2016-12-17: 4 mg via INTRAVENOUS
  Filled 2016-12-17: qty 1

## 2016-12-17 MED ORDER — NAPROXEN 500 MG PO TABS: 500.0000 mg | ORAL_TABLET | Freq: Two times a day (BID) | ORAL | 0 refills | Status: DC

## 2016-12-17 NOTE — Discharge Instructions (Signed)
Naproxen as prescribed.  Take your Percocet as previously prescribed as needed for pain.  Follow-up with your primary Dr. if not improving in the next 2-3 days, and return to the ER if your symptoms significantly worsen or change in the meantime.

## 2016-12-17 NOTE — ED Provider Notes (Signed)
[facility] DEPT Provider Note   CSN: [id number] Arrival date & time: 12/16/16  2210     History   Chief Complaint Chief Complaint  Patient presents with  . Chest Pain    HPI Alexis Ortega is a 71 y.o. female.  Patient is a 71 year old female with past medical history of COPD. She presents today for evaluation of chest pain. This started earlier this evening. She describes it as sharp and located in the center of her chest. She denies any nausea, diaphoresis, or radiation. She was treated here several days ago for a COPD exacerbation is currently taking prednisone. She denies any recent exertional symptoms.  According to her records, she had a heart cath in July 2015 which revealed normal coronary arteries and normal left ventricular function.   The history is provided by the patient.  Chest Pain   This is a new problem. The current episode started 3 to 5 hours ago. Episode frequency: Intermittently. The problem has not changed since onset.The pain is associated with movement. The pain is present in the substernal region. The pain is severe. The quality of the pain is described as sharp. The pain does not radiate. Pertinent negatives include no cough, no exertional chest pressure, no fever, no palpitations, no shortness of breath and no sputum production. She has tried nothing for the symptoms. There are no known risk factors.    Past Medical History:  Diagnosis Date  . Arthritis   . Asthma   . Depression   . Fibromyalgia   . GERD (gastroesophageal reflux disease)   . Hypertension   . Hypothyroidism   . Thyroid disease     Patient Active Problem List   Diagnosis Date Noted  . COPD exacerbation ([place]) 12/13/2016  . Chest pain 10/01/2013  . Asthmatic bronchitis 07/11/2012  . Cough 07/11/2012  . Tobacco abuse 07/11/2012  . HTN (hypertension) 07/11/2012  . Hypothyroidism 07/11/2012  . Syncope 07/11/2012  . Hyponatremia 07/11/2012  . Anemia 07/11/2012    Past  Surgical History:  Procedure Laterality Date  . ABDOMINAL HYSTERECTOMY    . BACK SURGERY    . CARPAL TUNNEL RELEASE Right   . CHOLECYSTECTOMY    . EYE SURGERY     cataract with lens implant  . KNEE ARTHROSCOPY Left   . LEFT HEART CATHETERIZATION WITH CORONARY ANGIOGRAM N/A 10/02/2013   Procedure: LEFT HEART CATHETERIZATION WITH CORONARY ANGIOGRAM;  Surgeon: Lorretta Harp, MD;  Location: [facility] CATH LAB;  Service: Cardiovascular;  Laterality: N/A;  . NECK SURGERY    . TOTAL KNEE ARTHROPLASTY Left 06/01/2012   Procedure: LEFT TOTAL KNEE ARTHROPLASTY;  Surgeon: Ninetta Lights, MD;  Location: [facility] [place];  Service: Orthopedics;  Laterality: Left;  . TUBAL LIGATION      OB History    No data available       Home Medications    Prior to Admission medications   Medication Sig Start Date End Date Taking? Authorizing Provider  alendronate (FOSAMAX) 70 MG tablet Take 70 mg by mouth once a week. Take with a full glass of water on an empty stomach.    [provider]  ALPRAZolam Duanne Moron) 0.5 MG tablet Take 0.5 mg by mouth at bedtime as needed for sleep.    [provider]  amLODipine (NORVASC) 10 MG tablet Take 10 mg by mouth daily. 03/30/14   [provider]  aspirin EC 81 MG tablet Take 81 mg by mouth daily.    [provider]  cholecalciferol (VITAMIN D) 1000 units tablet Take 1,000 Units by mouth daily.    [provider]  citalopram (CELEXA) 20 MG tablet Take 20 mg by mouth daily.    [provider]  doxycycline (VIBRA-TABS) 100 MG tablet Take 1 tablet (100 mg total) by mouth every 12 (twelve) hours. 12/13/16 12/19/16  Johnson, Clanford L, MD  guaiFENesin (MUCINEX) 600 MG 12 hr tablet Take 1 tablet (600 mg total) by mouth 2 (two) times daily. 12/13/16 12/20/16  Murlean Iba, MD  Levothyroxine Sodium 100 MCG CAPS Take 100 mcg by mouth daily before breakfast.     [provider]  losartan (COZAAR) 100 MG tablet Take 100 mg by mouth  daily.    [provider]  predniSONE (DELTASONE) 20 MG tablet Take 2 tablets (40 mg total) by mouth daily with breakfast. 12/13/16 12/20/16  Murlean Iba, MD  PROAIR HFA 108 (90 BASE) MCG/ACT inhaler Inhale 2 puffs into the lungs every 4 (four) hours as needed for wheezing.  08/24/13   [provider]    Family History Family History  Problem Relation Age of Onset  . Allergies Son   . Asthma Daughter     Social History Social History  Substance Use Topics  . Smoking status: Former Smoker    Packs/day: 1.00    Years: 40.00    Types: Cigarettes    Quit date: 06/14/2012  . Smokeless tobacco: Never Used  . Alcohol use No     Allergies   Solu-medrol [methylprednisolone]; Diclofenac; and Tramadol   Review of Systems Review of Systems  Constitutional: Negative for fever.  Respiratory: Negative for cough, sputum production and shortness of breath.   Cardiovascular: Positive for chest pain. Negative for palpitations.  All other systems reviewed and are negative.    Physical Exam Updated Vital Signs BP (!) 168/67 (BP Location: Right Arm)   Pulse 67   Temp 97.8 F (36.6 C) (Oral)   Resp 13   Ht 5\' 4"  (1.626 m)   Wt 72.1 kg (159 lb)   SpO2 96%   BMI 27.29 kg/m   Physical Exam  Constitutional: She is oriented to person, place, and time. She appears well-developed and well-nourished. No distress.  HENT:  Head: Normocephalic and atraumatic.  Neck: Normal range of motion. Neck supple.  Cardiovascular: Normal rate and regular rhythm.  Exam reveals no gallop and no friction rub.   No murmur heard. Pulmonary/Chest: Effort normal and breath sounds normal. No respiratory distress. She has no wheezes.  Abdominal: Soft. Bowel sounds are normal. She exhibits no distension. There is no tenderness.  Musculoskeletal: Normal range of motion. She exhibits no edema.  There is no calf tenderness, no edema, and Homans sign is absent bilaterally.  Neurological: She  is alert and oriented to person, place, and time.  Skin: Skin is warm and dry. She is not diaphoretic.  Nursing note and vitals reviewed.    ED Treatments / Results  Labs (all labs ordered are listed, but only abnormal results are displayed) Labs Reviewed  CBC WITH DIFFERENTIAL/PLATELET - Abnormal; Notable for the following:       Result Value   WBC 13.8 (*)    Neutro Abs 10.5 (*)    Monocytes Absolute 1.2 (*)    All other components within normal limits  BASIC METABOLIC PANEL - Abnormal; Notable for the following:    Sodium 131 (*)    Chloride 94 (*)    Glucose, Bld 115 (*)  BUN 21 (*)    Creatinine, Ser 1.09 (*)    GFR calc non Af Amer 50 (*)    GFR calc Af Amer 58 (*)    All other components within normal limits  TROPONIN I    EKG  EKG Interpretation  Date/Time:  Wednesday December 16 2016 22:18:35 EDT Ventricular Rate:  70 PR Interval:  170 QRS Duration: 80 QT Interval:  412 QTC Calculation: 444 R Axis:   19 Text Interpretation:  Normal sinus rhythm Normal ECG Confirmed by Veryl Speak [phone]) on 12/16/2016 11:58:09 PM       Radiology No results found.  Procedures Procedures (including critical care time)  Medications Ordered in ED Medications  morphine 4 MG/ML injection 4 mg (not administered)     Initial Impression / Assessment and Plan / ED Course  I have reviewed the triage vital signs and the nursing notes.  Pertinent labs & imaging results that were available during my care of the patient were reviewed by me and considered in my medical decision making (see chart for details).  Patient presents here with complaints of chest pain that started earlier this evening. Her pain is sharp in nature and occurs intermittently with no precipitating factors. Her workup shows an unchanged EKG and negative troponin 2. This combined with a normal heart cath in 2015 makes me feel as though a cardiac etiology is extremely unlikely. There is no pleuritic  component, no hypoxia, and there is no tachycardia. I highly doubt pulmonary embolism.  There are symptoms have improved significantly after a dose of morphine in the ER. At this point, I have discussed the disposition with the patient and her husband. She does not want to stay in the hospital and I am comfortable with discharging her. She will be given an anti-inflammatory medication and is to take the Percocet she has at home as needed. She understands that if her symptoms worsen or change, she is to return to the ER to be reevaluated.  Final Clinical Impressions(s) / ED Diagnoses   Final diagnoses:  None    New Prescriptions New Prescriptions   No medications on file     Veryl Speak, MD 12/17/16 [phone]

## 2016-12-17 NOTE — ED Notes (Signed)
Pt taken to bathroom and hooked back up; Dr. Stark Jock in room at this time

## 2016-12-22 DIAGNOSIS — J440 Chronic obstructive pulmonary disease with acute lower respiratory infection: Secondary | ICD-10-CM | POA: Diagnosis not present

## 2016-12-22 DIAGNOSIS — M545 Low back pain: Secondary | ICD-10-CM | POA: Diagnosis not present

## 2016-12-22 DIAGNOSIS — M80062D Age-related osteoporosis with current pathological fracture, left lower leg, subsequent encounter for fracture with routine healing: Secondary | ICD-10-CM | POA: Diagnosis not present

## 2016-12-28 DIAGNOSIS — M810 Age-related osteoporosis without current pathological fracture: Secondary | ICD-10-CM | POA: Diagnosis not present

## 2016-12-29 DIAGNOSIS — M810 Age-related osteoporosis without current pathological fracture: Secondary | ICD-10-CM | POA: Diagnosis not present

## 2017-01-12 DIAGNOSIS — M25562 Pain in left knee: Secondary | ICD-10-CM | POA: Diagnosis not present

## 2017-01-19 DIAGNOSIS — Z96652 Presence of left artificial knee joint: Secondary | ICD-10-CM | POA: Diagnosis not present

## 2017-01-19 DIAGNOSIS — M25562 Pain in left knee: Secondary | ICD-10-CM | POA: Diagnosis not present

## 2017-01-28 DIAGNOSIS — M25562 Pain in left knee: Secondary | ICD-10-CM | POA: Diagnosis not present

## 2017-02-08 DIAGNOSIS — M25562 Pain in left knee: Secondary | ICD-10-CM | POA: Diagnosis not present

## 2017-02-10 DIAGNOSIS — M25562 Pain in left knee: Secondary | ICD-10-CM | POA: Diagnosis not present

## 2017-02-15 DIAGNOSIS — M25562 Pain in left knee: Secondary | ICD-10-CM | POA: Diagnosis not present

## 2017-03-01 DIAGNOSIS — E038 Other specified hypothyroidism: Secondary | ICD-10-CM | POA: Diagnosis not present

## 2017-03-01 DIAGNOSIS — F411 Generalized anxiety disorder: Secondary | ICD-10-CM | POA: Diagnosis not present

## 2017-03-01 DIAGNOSIS — M545 Low back pain: Secondary | ICD-10-CM | POA: Diagnosis not present

## 2017-03-01 DIAGNOSIS — M80062D Age-related osteoporosis with current pathological fracture, left lower leg, subsequent encounter for fracture with routine healing: Secondary | ICD-10-CM | POA: Diagnosis not present

## 2017-03-01 DIAGNOSIS — J441 Chronic obstructive pulmonary disease with (acute) exacerbation: Secondary | ICD-10-CM | POA: Diagnosis not present

## 2017-04-20 DIAGNOSIS — M5414 Radiculopathy, thoracic region: Secondary | ICD-10-CM | POA: Diagnosis not present

## 2017-04-28 DIAGNOSIS — E038 Other specified hypothyroidism: Secondary | ICD-10-CM | POA: Diagnosis not present

## 2017-04-28 DIAGNOSIS — J441 Chronic obstructive pulmonary disease with (acute) exacerbation: Secondary | ICD-10-CM | POA: Diagnosis not present

## 2017-04-28 DIAGNOSIS — M80062D Age-related osteoporosis with current pathological fracture, left lower leg, subsequent encounter for fracture with routine healing: Secondary | ICD-10-CM | POA: Diagnosis not present

## 2017-04-28 DIAGNOSIS — F411 Generalized anxiety disorder: Secondary | ICD-10-CM | POA: Diagnosis not present

## 2017-04-28 DIAGNOSIS — I10 Essential (primary) hypertension: Secondary | ICD-10-CM | POA: Diagnosis not present

## 2017-04-28 DIAGNOSIS — M545 Low back pain: Secondary | ICD-10-CM | POA: Diagnosis not present

## 2017-05-04 DIAGNOSIS — M5414 Radiculopathy, thoracic region: Secondary | ICD-10-CM | POA: Diagnosis not present

## 2017-07-05 DIAGNOSIS — M80062D Age-related osteoporosis with current pathological fracture, left lower leg, subsequent encounter for fracture with routine healing: Secondary | ICD-10-CM | POA: Diagnosis not present

## 2017-07-05 DIAGNOSIS — J441 Chronic obstructive pulmonary disease with (acute) exacerbation: Secondary | ICD-10-CM | POA: Diagnosis not present

## 2017-07-05 DIAGNOSIS — I10 Essential (primary) hypertension: Secondary | ICD-10-CM | POA: Diagnosis not present

## 2017-07-05 DIAGNOSIS — M545 Low back pain: Secondary | ICD-10-CM | POA: Diagnosis not present

## 2017-07-05 DIAGNOSIS — F411 Generalized anxiety disorder: Secondary | ICD-10-CM | POA: Diagnosis not present

## 2017-07-05 DIAGNOSIS — E038 Other specified hypothyroidism: Secondary | ICD-10-CM | POA: Diagnosis not present

## 2017-07-16 DIAGNOSIS — E038 Other specified hypothyroidism: Secondary | ICD-10-CM | POA: Diagnosis not present

## 2017-07-16 DIAGNOSIS — I10 Essential (primary) hypertension: Secondary | ICD-10-CM | POA: Diagnosis not present

## 2017-07-16 DIAGNOSIS — M545 Low back pain: Secondary | ICD-10-CM | POA: Diagnosis not present

## 2017-07-16 DIAGNOSIS — M80062D Age-related osteoporosis with current pathological fracture, left lower leg, subsequent encounter for fracture with routine healing: Secondary | ICD-10-CM | POA: Diagnosis not present

## 2017-07-16 DIAGNOSIS — F411 Generalized anxiety disorder: Secondary | ICD-10-CM | POA: Diagnosis not present

## 2017-07-16 DIAGNOSIS — J441 Chronic obstructive pulmonary disease with (acute) exacerbation: Secondary | ICD-10-CM | POA: Diagnosis not present

## 2017-07-16 DIAGNOSIS — R7303 Prediabetes: Secondary | ICD-10-CM | POA: Diagnosis not present

## 2017-07-26 DIAGNOSIS — M5414 Radiculopathy, thoracic region: Secondary | ICD-10-CM | POA: Diagnosis not present

## 2017-08-10 DIAGNOSIS — M5414 Radiculopathy, thoracic region: Secondary | ICD-10-CM | POA: Diagnosis not present

## 2017-09-17 DIAGNOSIS — F411 Generalized anxiety disorder: Secondary | ICD-10-CM | POA: Diagnosis not present

## 2017-09-17 DIAGNOSIS — E038 Other specified hypothyroidism: Secondary | ICD-10-CM | POA: Diagnosis not present

## 2017-09-17 DIAGNOSIS — M545 Low back pain: Secondary | ICD-10-CM | POA: Diagnosis not present

## 2017-09-17 DIAGNOSIS — J441 Chronic obstructive pulmonary disease with (acute) exacerbation: Secondary | ICD-10-CM | POA: Diagnosis not present

## 2017-09-17 DIAGNOSIS — I10 Essential (primary) hypertension: Secondary | ICD-10-CM | POA: Diagnosis not present

## 2017-09-17 DIAGNOSIS — M80062D Age-related osteoporosis with current pathological fracture, left lower leg, subsequent encounter for fracture with routine healing: Secondary | ICD-10-CM | POA: Diagnosis not present

## 2017-10-25 DIAGNOSIS — Z86010 Personal history of colonic polyps: Secondary | ICD-10-CM | POA: Diagnosis not present

## 2017-11-18 DIAGNOSIS — M545 Low back pain: Secondary | ICD-10-CM | POA: Diagnosis not present

## 2017-11-18 DIAGNOSIS — F411 Generalized anxiety disorder: Secondary | ICD-10-CM | POA: Diagnosis not present

## 2017-11-18 DIAGNOSIS — M80062D Age-related osteoporosis with current pathological fracture, left lower leg, subsequent encounter for fracture with routine healing: Secondary | ICD-10-CM | POA: Diagnosis not present

## 2017-11-18 DIAGNOSIS — Z1389 Encounter for screening for other disorder: Secondary | ICD-10-CM | POA: Diagnosis not present

## 2017-11-18 DIAGNOSIS — I10 Essential (primary) hypertension: Secondary | ICD-10-CM | POA: Diagnosis not present

## 2017-11-18 DIAGNOSIS — Z0000 Encounter for general adult medical examination without abnormal findings: Secondary | ICD-10-CM | POA: Diagnosis not present

## 2017-11-18 DIAGNOSIS — J440 Chronic obstructive pulmonary disease with acute lower respiratory infection: Secondary | ICD-10-CM | POA: Diagnosis not present

## 2017-11-18 DIAGNOSIS — E038 Other specified hypothyroidism: Secondary | ICD-10-CM | POA: Diagnosis not present

## 2017-12-20 DIAGNOSIS — I10 Essential (primary) hypertension: Secondary | ICD-10-CM | POA: Diagnosis not present

## 2017-12-20 DIAGNOSIS — H04123 Dry eye syndrome of bilateral lacrimal glands: Secondary | ICD-10-CM | POA: Diagnosis not present

## 2017-12-23 DIAGNOSIS — I10 Essential (primary) hypertension: Secondary | ICD-10-CM | POA: Diagnosis not present

## 2017-12-23 DIAGNOSIS — K625 Hemorrhage of anus and rectum: Secondary | ICD-10-CM | POA: Diagnosis not present

## 2017-12-23 DIAGNOSIS — F329 Major depressive disorder, single episode, unspecified: Secondary | ICD-10-CM | POA: Diagnosis not present

## 2017-12-23 DIAGNOSIS — E039 Hypothyroidism, unspecified: Secondary | ICD-10-CM | POA: Diagnosis not present

## 2017-12-23 DIAGNOSIS — K5730 Diverticulosis of large intestine without perforation or abscess without bleeding: Secondary | ICD-10-CM | POA: Diagnosis not present

## 2017-12-23 DIAGNOSIS — F17200 Nicotine dependence, unspecified, uncomplicated: Secondary | ICD-10-CM | POA: Diagnosis not present

## 2017-12-23 DIAGNOSIS — Z886 Allergy status to analgesic agent status: Secondary | ICD-10-CM | POA: Diagnosis not present

## 2017-12-23 DIAGNOSIS — Z86010 Personal history of colonic polyps: Secondary | ICD-10-CM | POA: Diagnosis not present

## 2017-12-23 DIAGNOSIS — Z888 Allergy status to other drugs, medicaments and biological substances status: Secondary | ICD-10-CM | POA: Diagnosis not present

## 2017-12-23 DIAGNOSIS — J45909 Unspecified asthma, uncomplicated: Secondary | ICD-10-CM | POA: Diagnosis not present

## 2017-12-23 DIAGNOSIS — K641 Second degree hemorrhoids: Secondary | ICD-10-CM | POA: Diagnosis not present

## 2018-01-04 DIAGNOSIS — Z23 Encounter for immunization: Secondary | ICD-10-CM | POA: Diagnosis not present

## 2018-02-02 DIAGNOSIS — M80062D Age-related osteoporosis with current pathological fracture, left lower leg, subsequent encounter for fracture with routine healing: Secondary | ICD-10-CM | POA: Diagnosis not present

## 2018-02-02 DIAGNOSIS — E038 Other specified hypothyroidism: Secondary | ICD-10-CM | POA: Diagnosis not present

## 2018-02-02 DIAGNOSIS — F411 Generalized anxiety disorder: Secondary | ICD-10-CM | POA: Diagnosis not present

## 2018-02-02 DIAGNOSIS — J440 Chronic obstructive pulmonary disease with acute lower respiratory infection: Secondary | ICD-10-CM | POA: Diagnosis not present

## 2018-02-02 DIAGNOSIS — I10 Essential (primary) hypertension: Secondary | ICD-10-CM | POA: Diagnosis not present

## 2018-02-02 DIAGNOSIS — M545 Low back pain: Secondary | ICD-10-CM | POA: Diagnosis not present

## 2018-03-14 DIAGNOSIS — K625 Hemorrhage of anus and rectum: Secondary | ICD-10-CM | POA: Diagnosis not present

## 2018-04-04 DIAGNOSIS — J441 Chronic obstructive pulmonary disease with (acute) exacerbation: Secondary | ICD-10-CM | POA: Diagnosis not present

## 2018-04-04 DIAGNOSIS — F411 Generalized anxiety disorder: Secondary | ICD-10-CM | POA: Diagnosis not present

## 2018-04-04 DIAGNOSIS — M80062D Age-related osteoporosis with current pathological fracture, left lower leg, subsequent encounter for fracture with routine healing: Secondary | ICD-10-CM | POA: Diagnosis not present

## 2018-04-04 DIAGNOSIS — E038 Other specified hypothyroidism: Secondary | ICD-10-CM | POA: Diagnosis not present

## 2018-04-04 DIAGNOSIS — M545 Low back pain: Secondary | ICD-10-CM | POA: Diagnosis not present

## 2018-04-04 DIAGNOSIS — I10 Essential (primary) hypertension: Secondary | ICD-10-CM | POA: Diagnosis not present

## 2018-06-08 DIAGNOSIS — M545 Low back pain: Secondary | ICD-10-CM | POA: Diagnosis not present

## 2018-06-08 DIAGNOSIS — F411 Generalized anxiety disorder: Secondary | ICD-10-CM | POA: Diagnosis not present

## 2018-06-08 DIAGNOSIS — I10 Essential (primary) hypertension: Secondary | ICD-10-CM | POA: Diagnosis not present

## 2018-06-08 DIAGNOSIS — M80062D Age-related osteoporosis with current pathological fracture, left lower leg, subsequent encounter for fracture with routine healing: Secondary | ICD-10-CM | POA: Diagnosis not present

## 2018-06-08 DIAGNOSIS — E038 Other specified hypothyroidism: Secondary | ICD-10-CM | POA: Diagnosis not present

## 2018-06-08 DIAGNOSIS — J441 Chronic obstructive pulmonary disease with (acute) exacerbation: Secondary | ICD-10-CM | POA: Diagnosis not present

## 2018-08-15 DIAGNOSIS — J440 Chronic obstructive pulmonary disease with acute lower respiratory infection: Secondary | ICD-10-CM | POA: Diagnosis not present

## 2018-09-21 ENCOUNTER — Other Ambulatory Visit: Payer: Self-pay

## 2018-09-21 ENCOUNTER — Emergency Department (HOSPITAL_COMMUNITY): Payer: Medicare Other

## 2018-09-21 ENCOUNTER — Encounter (HOSPITAL_COMMUNITY): Payer: Self-pay | Admitting: Emergency Medicine

## 2018-09-21 ENCOUNTER — Emergency Department (HOSPITAL_COMMUNITY)
Admission: EM | Admit: 2018-09-21 | Discharge: 2018-09-22 | Disposition: A | Discharge status: Home / Self Care | Payer: Medicare Other | Attending: Emergency Medicine | Admitting: Emergency Medicine

## 2018-09-21 DIAGNOSIS — I10 Essential (primary) hypertension: Secondary | ICD-10-CM | POA: Insufficient documentation

## 2018-09-21 DIAGNOSIS — R0789 Other chest pain: Secondary | ICD-10-CM | POA: Diagnosis not present

## 2018-09-21 DIAGNOSIS — E039 Hypothyroidism, unspecified: Secondary | ICD-10-CM | POA: Insufficient documentation

## 2018-09-21 DIAGNOSIS — Z79899 Other long term (current) drug therapy: Secondary | ICD-10-CM | POA: Diagnosis not present

## 2018-09-21 DIAGNOSIS — F17210 Nicotine dependence, cigarettes, uncomplicated: Secondary | ICD-10-CM | POA: Insufficient documentation

## 2018-09-21 DIAGNOSIS — Z96652 Presence of left artificial knee joint: Secondary | ICD-10-CM | POA: Diagnosis not present

## 2018-09-21 DIAGNOSIS — Z7982 Long term (current) use of aspirin: Secondary | ICD-10-CM | POA: Insufficient documentation

## 2018-09-21 DIAGNOSIS — R0689 Other abnormalities of breathing: Secondary | ICD-10-CM | POA: Diagnosis not present

## 2018-09-21 DIAGNOSIS — J45909 Unspecified asthma, uncomplicated: Secondary | ICD-10-CM | POA: Diagnosis not present

## 2018-09-21 DIAGNOSIS — R079 Chest pain, unspecified: Secondary | ICD-10-CM | POA: Diagnosis not present

## 2018-09-21 LAB — CBC WITH DIFFERENTIAL/PLATELET
Abs Immature Granulocytes: 0.04 10*3/uL (ref 0.00–0.07)
Basophils Absolute: 0.1 10*3/uL (ref 0.0–0.1)
Basophils Relative: 1 %
Eosinophils Absolute: 0.3 10*3/uL (ref 0.0–0.5)
Eosinophils Relative: 3 %
HCT: 40.2 % (ref 36.0–46.0)
Hemoglobin: 13.3 g/dL (ref 12.0–15.0)
Immature Granulocytes: 0 %
Lymphocytes Relative: 24 %
Lymphs Abs: 2.5 10*3/uL (ref 0.7–4.0)
MCH: 31.9 pg (ref 26.0–34.0)
MCHC: 33.1 g/dL (ref 30.0–36.0)
MCV: 96.4 fL (ref 80.0–100.0)
Monocytes Absolute: 1 10*3/uL (ref 0.1–1.0)
Monocytes Relative: 10 %
Neutro Abs: 6.3 10*3/uL (ref 1.7–7.7)
Neutrophils Relative %: 62 %
Platelets: 280 10*3/uL (ref 150–400)
RBC: 4.17 MIL/uL (ref 3.87–5.11)
RDW: 13 % (ref 11.5–15.5)
WBC: 10.2 10*3/uL (ref 4.0–10.5)
nRBC: 0 % (ref 0.0–0.2)

## 2018-09-21 LAB — BASIC METABOLIC PANEL
Anion gap: 10 (ref 5–15)
BUN: 15 mg/dL (ref 8–23)
CO2: 25 mmol/L (ref 22–32)
Calcium: 9.1 mg/dL (ref 8.9–10.3)
Chloride: 101 mmol/L (ref 98–111)
Creatinine, Ser: 0.77 mg/dL (ref 0.44–1.00)
GFR calc Af Amer: >60 mL/min (ref >60)
GFR calc non Af Amer: >60 mL/min (ref >60)
Glucose, Bld: 95 mg/dL (ref 70–99)
Potassium: 3.7 mmol/L (ref 3.5–5.1)
Sodium: 136 mmol/L (ref 135–145)

## 2018-09-21 LAB — TROPONIN I (HIGH SENSITIVITY): Troponin I (High Sensitivity): 3 ng/L (ref <18)

## 2018-09-21 NOTE — ED Provider Notes (Signed)
[facility] EMERGENCY DEPARTMENT Provider Note   CSN: [id number] Arrival date & time: 09/21/18  2208    History   Chief Complaint Chief Complaint  Patient presents with  . Chest Pain    HPI Alexis Ortega is a 73 y.o. female.     Transient episode (10 minutes) of pressure-like sensation in her chest just prior to ED visit with associated headache and mild diaphoresis.  No known cardiac history.  Patient has had a "normal" cardiac cath in the past.  She took 4 baby aspirin at home.  Nothing makes symptoms better or worse.  She is now feeling much better.     Past Medical History:  Diagnosis Date  . Arthritis   . Asthma   . Depression   . Fibromyalgia   . GERD (gastroesophageal reflux disease)   . Hypertension   . Hypothyroidism   . Thyroid disease     Patient Active Problem List   Diagnosis Date Noted  . COPD exacerbation ([place]) 12/13/2016  . Chest pain 10/01/2013  . Asthmatic bronchitis 07/11/2012  . Cough 07/11/2012  . Tobacco abuse 07/11/2012  . HTN (hypertension) 07/11/2012  . Hypothyroidism 07/11/2012  . Syncope 07/11/2012  . Hyponatremia 07/11/2012  . Anemia 07/11/2012    Past Surgical History:  Procedure Laterality Date  . ABDOMINAL HYSTERECTOMY    . BACK SURGERY    . CARPAL TUNNEL RELEASE Right   . CHOLECYSTECTOMY    . EYE SURGERY     cataract with lens implant  . KNEE ARTHROSCOPY Left   . LEFT HEART CATHETERIZATION WITH CORONARY ANGIOGRAM N/A 10/02/2013   Procedure: LEFT HEART CATHETERIZATION WITH CORONARY ANGIOGRAM;  Surgeon: Lorretta Harp, MD;  Location: [facility] CATH LAB;  Service: Cardiovascular;  Laterality: N/A;  . NECK SURGERY    . TOTAL KNEE ARTHROPLASTY Left 06/01/2012   Procedure: LEFT TOTAL KNEE ARTHROPLASTY;  Surgeon: Ninetta Lights, MD;  Location: [place];  Service: Orthopedics;  Laterality: Left;  . TUBAL LIGATION       OB History   No obstetric history on file.      Home Medications    Prior to Admission medications    Medication Sig Start Date End Date Taking? Authorizing Provider  alendronate (FOSAMAX) 70 MG tablet Take 70 mg by mouth once a week. Take with a full glass of water on an empty stomach.    [provider]  ALPRAZolam Duanne Moron) 0.5 MG tablet Take 0.5 mg by mouth at bedtime as needed for sleep.    [provider]  amLODipine (NORVASC) 10 MG tablet Take 10 mg by mouth daily. 03/30/14   [provider]  aspirin EC 81 MG tablet Take 81 mg by mouth daily.    [provider]  cholecalciferol (VITAMIN D) 1000 units tablet Take 1,000 Units by mouth daily.    [provider]  citalopram (CELEXA) 20 MG tablet Take 20 mg by mouth daily.    [provider]  Levothyroxine Sodium 100 MCG CAPS Take 100 mcg by mouth daily before breakfast.     [provider]  losartan (COZAAR) 100 MG tablet Take 100 mg by mouth daily.    [provider]  naproxen (NAPROSYN) 500 MG tablet Take 1 tablet (500 mg total) by mouth 2 (two) times daily. 12/17/16   Veryl Speak, MD  PROAIR HFA 108 (90 BASE) MCG/ACT inhaler Inhale 2 puffs into the lungs every 4 (four) hours as needed for wheezing.  08/24/13   [provider]    Family History Family History  Problem Relation Age of Onset  . Allergies Son   . Asthma Daughter     Social History Social History   Tobacco Use  . Smoking status: Current Every Day Smoker    Packs/day: 1.00    Years: 40.00    Pack years: 40.00    Types: Cigarettes    Last attempt to quit: 06/14/2012    Years since quitting: 6.2  . Smokeless tobacco: Never Used  Substance Use Topics  . Alcohol use: No  . Drug use: No     Allergies   Solu-medrol [methylprednisolone], Diclofenac, and Tramadol   Review of Systems Review of Systems  All other systems reviewed and are negative.    Physical Exam Updated Vital Signs BP (!) 134/54 (BP Location: Right Arm)   Pulse 68   Temp 98.2 F (36.8 C) (Oral)   Resp 20   Ht  5\' 4"  (1.626 m)   Wt 72.6 kg   SpO2 96%   BMI 27.46 kg/m   Physical Exam Vitals signs and nursing note reviewed.  Constitutional:      Appearance: She is well-developed.  HENT:     Head: Normocephalic and atraumatic.  Eyes:     Conjunctiva/sclera: Conjunctivae normal.  Neck:     Musculoskeletal: Neck supple.  Cardiovascular:     Rate and Rhythm: Normal rate and regular rhythm.  Pulmonary:     Effort: Pulmonary effort is normal.     Breath sounds: Normal breath sounds.  Abdominal:     General: Bowel sounds are normal.     Palpations: Abdomen is soft.  Musculoskeletal: Normal range of motion.  Skin:    General: Skin is warm and dry.  Neurological:     Mental Status: She is alert and oriented to person, place, and time.  Psychiatric:        Behavior: Behavior normal.      ED Treatments / Results  Labs (all labs ordered are listed, but only abnormal results are displayed) Labs Reviewed  CBC WITH DIFFERENTIAL/PLATELET  BASIC METABOLIC PANEL  TROPONIN I (HIGH SENSITIVITY)  TROPONIN I (HIGH SENSITIVITY)    EKG EKG Interpretation  Date/Time:  Wednesday September 21 2018 22:23:14 EDT Ventricular Rate:  68 PR Interval:    QRS Duration: 80 QT Interval:  421 QTC Calculation: 448 R Axis:   56 Text Interpretation:  Sinus rhythm Confirmed by Nat Christen [phone]) on 09/21/2018 10:36:27 PM   Radiology No results found.  Procedures Procedures (including critical care time)  Medications Ordered in ED Medications - No data to display   Initial Impression / Assessment and Plan / ED Course  I have reviewed the triage vital signs and the nursing notes.  Pertinent labs & imaging results that were available during my care of the patient were reviewed by me and considered in my medical decision making (see chart for details).        Patient presents with chest pain.  Will do typical cardiac work-up.  Expect discharge home.  Discussed with Dr. Betsey Holiday  Final Clinical  Impressions(s) / ED Diagnoses   Final diagnoses:  Chest pain, unspecified type    ED Discharge Orders    None       Nat Christen, MD 09/21/18 2238

## 2018-09-21 NOTE — ED Triage Notes (Signed)
Pt from home via [facility]. Pt C/O chest pain that started while she was talking with family sitting on the porch. Pt has taken 324 ASA prior to EMS arrival.

## 2018-09-22 DIAGNOSIS — R0789 Other chest pain: Secondary | ICD-10-CM | POA: Diagnosis not present

## 2018-09-22 LAB — TROPONIN I (HIGH SENSITIVITY): Troponin I (High Sensitivity): 3 ng/L (ref <18)

## 2018-09-22 NOTE — ED Provider Notes (Signed)
Patient signed out to me by Dr. Lacinda Axon to follow-up on second troponin.  Patient presented to the ER for evaluation of chest discomfort.  She had an episode of onset of pressure-like sensation in her chest that came on prior to coming to the ER.  She was sitting at rest when the pain occurred.  She reports that it lasted approximately 10 minutes and then resolved.  She did take some aspirin prior to arrival, no other interventions.  She has not had any recurrence of symptoms.  This is felt to be very low risk based on this set of symptoms and also her cardiac risk factors.  Additionally she has had a cardiac cath in the past that showed no disease.  Troponin negative x2, patient with a normal EKG.  This is felt to be adequate work-up currently, can follow-up with PCP, return if symptoms recur.   Orpah Greek, MD 09/22/18 [phone]

## 2018-10-17 ENCOUNTER — Other Ambulatory Visit: Payer: Self-pay

## 2018-10-20 DIAGNOSIS — J440 Chronic obstructive pulmonary disease with acute lower respiratory infection: Secondary | ICD-10-CM | POA: Diagnosis not present

## 2018-10-20 DIAGNOSIS — M545 Low back pain: Secondary | ICD-10-CM | POA: Diagnosis not present

## 2018-11-18 DIAGNOSIS — Z79899 Other long term (current) drug therapy: Secondary | ICD-10-CM | POA: Diagnosis not present

## 2018-11-18 DIAGNOSIS — M545 Low back pain: Secondary | ICD-10-CM | POA: Diagnosis not present

## 2018-11-18 DIAGNOSIS — J440 Chronic obstructive pulmonary disease with acute lower respiratory infection: Secondary | ICD-10-CM | POA: Diagnosis not present

## 2018-12-10 IMAGING — CT CT CHEST LUNG CANCER SCREENING LOW DOSE W/O CM
2 of 4 series · 15 of 40 positions shown, 18 images · non-contrast
Comparison: 07/11/2012 chest CT angiogram.

CLINICAL DATA: 70-year-old asymptomatic female current smoker with
35 pack-year smoking history.

EXAM:
CT CHEST WITHOUT CONTRAST LOW-DOSE FOR LUNG CANCER SCREENING
TECHNIQUE: Multidetector CT imaging of the chest was performed following the
standard protocol without IV contrast.

[Series 2: axial st · axial · 0.67mm/px · z∈[+1205,+1460]mm · 12 of 61 slices shown, 15 images]
[im 5/61  mediastinal]
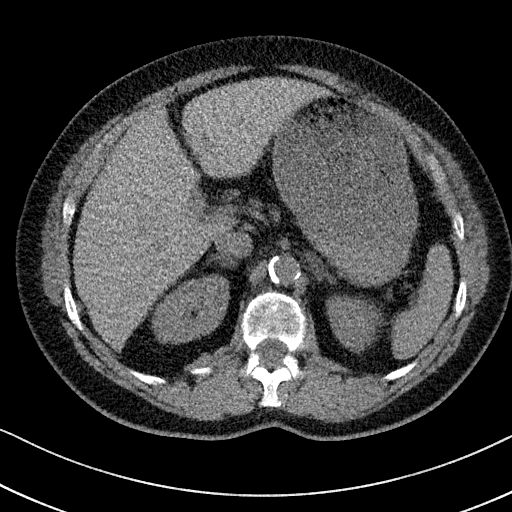
[im 5/61  lung]
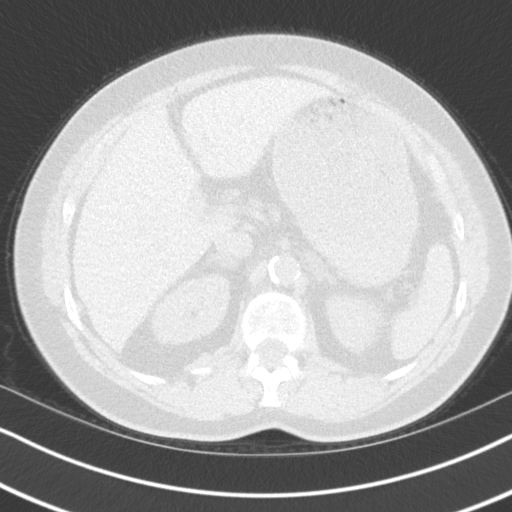
[im 10/61  lung]
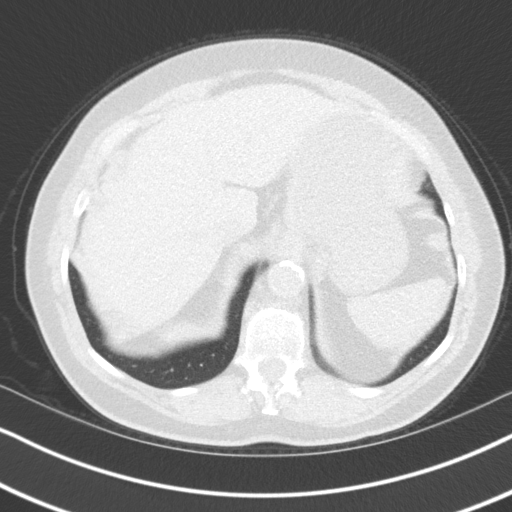
[im 14/61  lung]
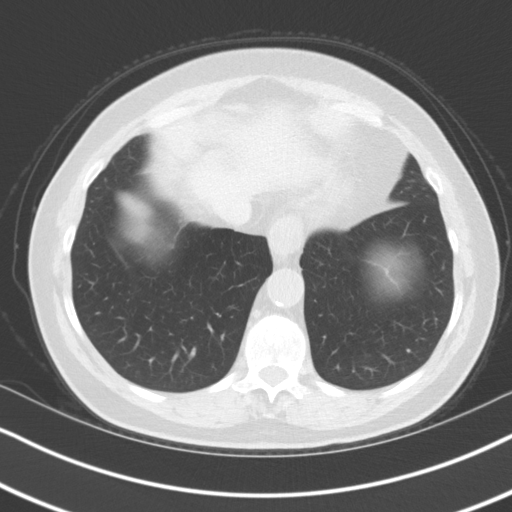
[im 19/61  lung]
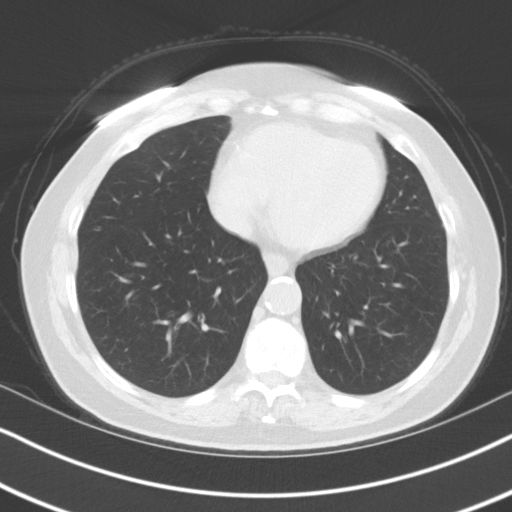
[im 24/61  mediastinal]
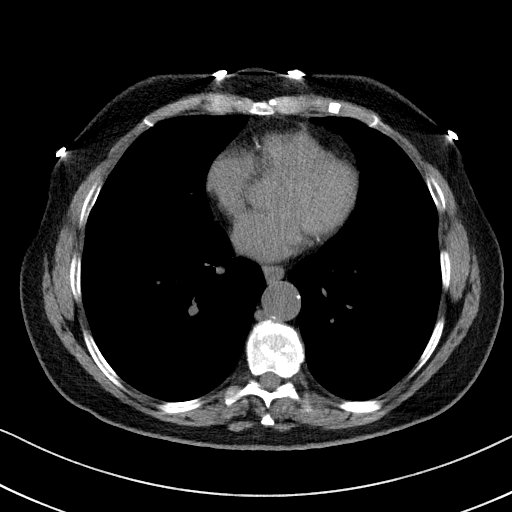
[im 24/61  lung]
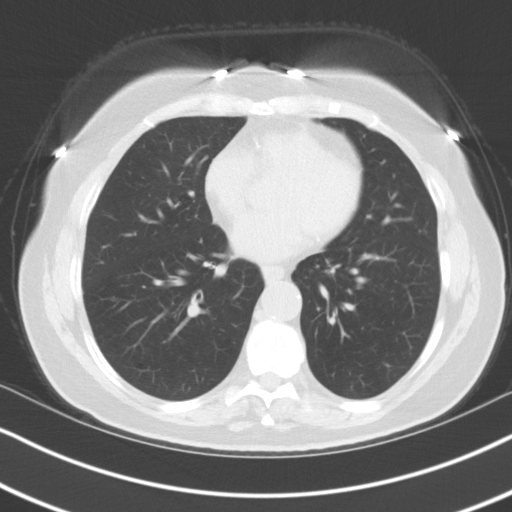
[im 28/61  lung]
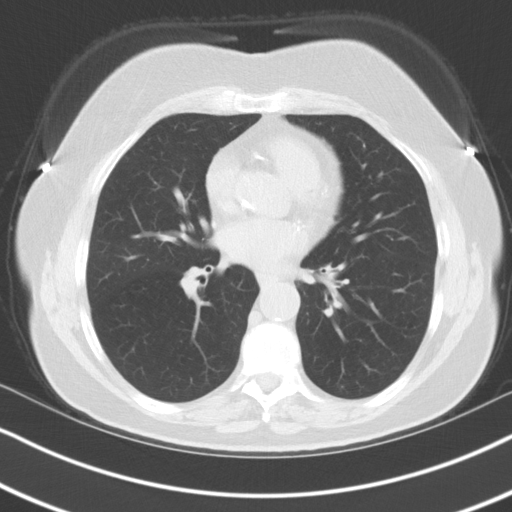
[im 33/61  lung]
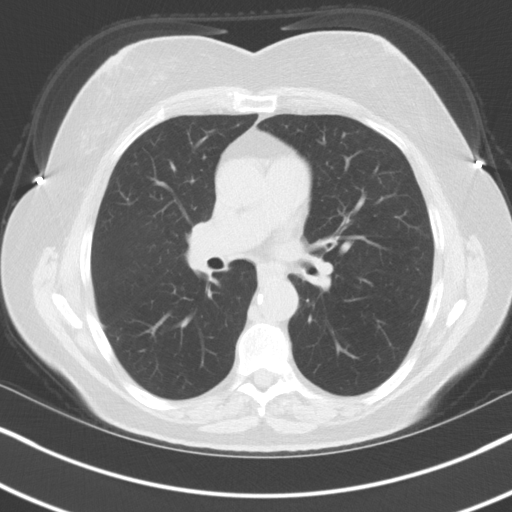
[im 37/61  lung]
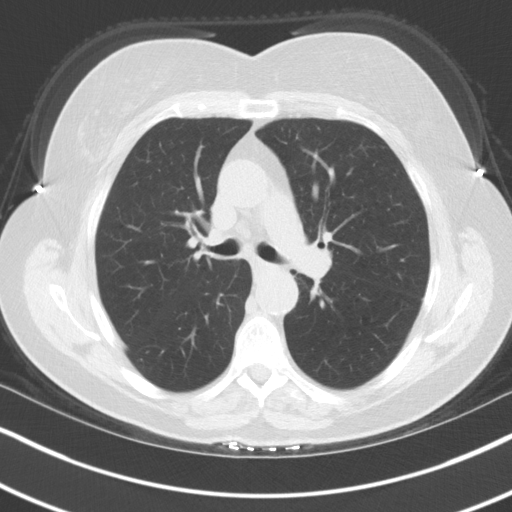
[im 42/61  mediastinal]
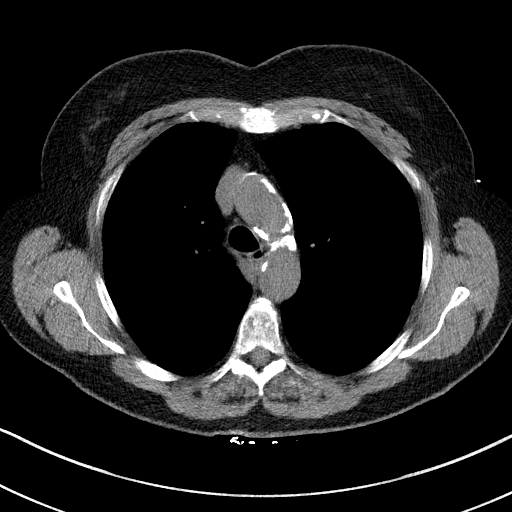
[im 42/61  lung]
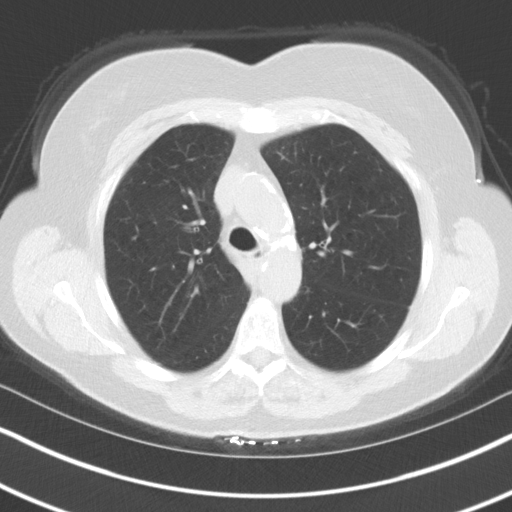
[im 47/61  lung]
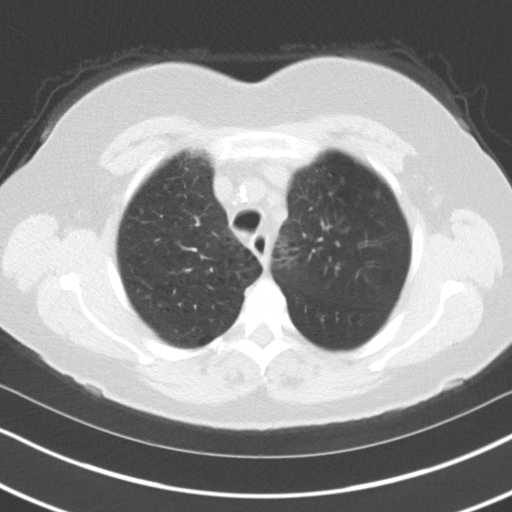
[im 51/61  lung]
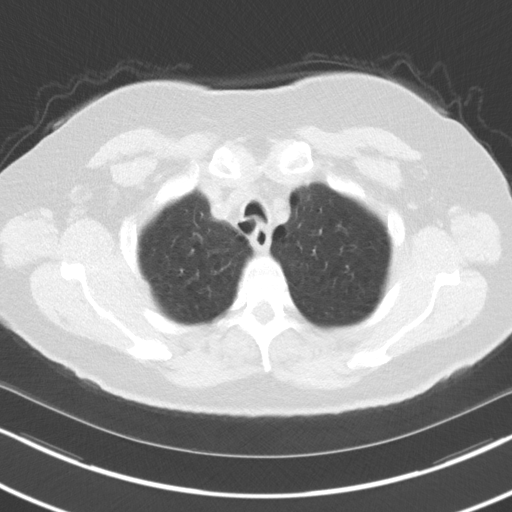
[im 56/61  lung]
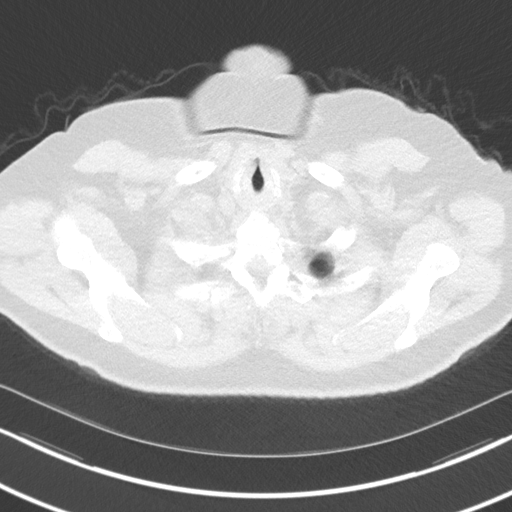

[Series 5: coronal · coronal · 0.64mm/px · 3 of 281 slices shown]
[im 57/281  lung]
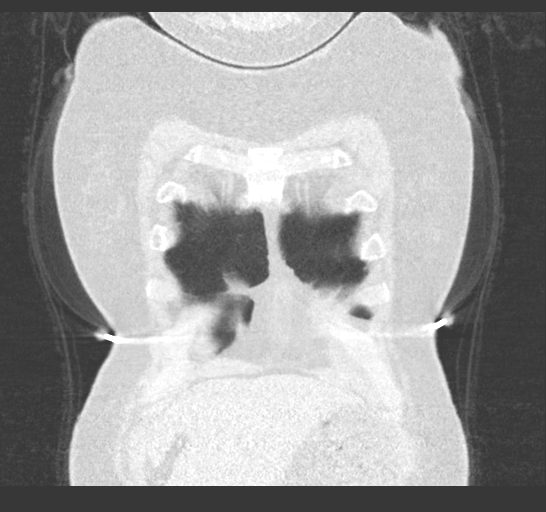
[im 113/281  lung]
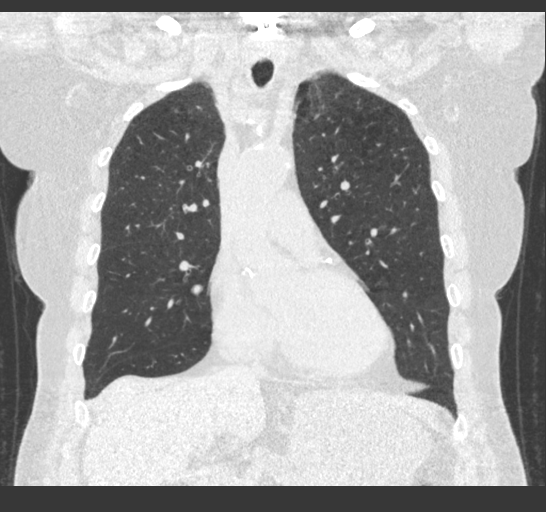
[im 169/281  lung]
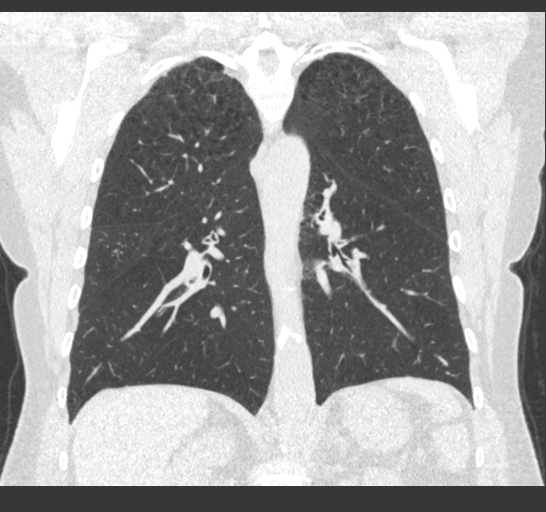

[15 of 40 positions shown; findings below may reference images not displayed]

FINDINGS: Cardiovascular: Normal heart size. No significant pericardial
fluid/thickening. Left main, left anterior descending, left
circumflex and right coronary atherosclerosis. Atherosclerotic
nonaneurysmal thoracic aorta. Normal caliber pulmonary arteries.

Mediastinum/Nodes: No discrete thyroid nodules. Unremarkable
esophagus. No pathologically enlarged axillary, mediastinal or gross
hilar lymph nodes, noting limited sensitivity for the detection of
hilar adenopathy on this noncontrast study.

Lungs/Pleura: No pneumothorax. No pleural effusion. Mild
centrilobular and paraseptal emphysema with diffuse bronchial wall
thickening. Apical right upper lobe pulmonary nodule measuring
mm in volume derived mean diameter (series 4/image 68). No acute
consolidative airspace disease, lung masses or additional
significant pulmonary nodules.

Upper abdomen: Small hiatal hernia. Cholecystectomy.

Musculoskeletal: No aggressive appearing focal osseous lesions.
Partially visualized surgical hardware from [facility] in the lower
cervical spine. Moderate thoracic spondylosis.
IMPRESSION: 1. Lung-RADS Category 2-S, benign appearance or behavior. Continue
annual screening with low-dose chest CT without contrast in 12
months.
2. The "S" modifier above refers to potentially clinically
significant non lung cancer related findings. Specifically, left
main and 3 vessel coronary atherosclerosis.
3. Aortic atherosclerosis.
4. Mild emphysema with diffuse bronchial wall thickening, suggesting
COPD.
5. Small hiatal hernia.

## 2018-12-29 DIAGNOSIS — M546 Pain in thoracic spine: Secondary | ICD-10-CM | POA: Diagnosis not present

## 2018-12-29 DIAGNOSIS — M5124 Other intervertebral disc displacement, thoracic region: Secondary | ICD-10-CM | POA: Diagnosis not present

## 2019-01-06 DIAGNOSIS — M546 Pain in thoracic spine: Secondary | ICD-10-CM | POA: Diagnosis not present

## 2019-01-09 DIAGNOSIS — Z1389 Encounter for screening for other disorder: Secondary | ICD-10-CM | POA: Diagnosis not present

## 2019-01-09 DIAGNOSIS — J449 Chronic obstructive pulmonary disease, unspecified: Secondary | ICD-10-CM | POA: Diagnosis not present

## 2019-01-09 DIAGNOSIS — M545 Low back pain: Secondary | ICD-10-CM | POA: Diagnosis not present

## 2019-01-09 DIAGNOSIS — Z0000 Encounter for general adult medical examination without abnormal findings: Secondary | ICD-10-CM | POA: Diagnosis not present

## 2019-03-26 ENCOUNTER — Emergency Department (HOSPITAL_COMMUNITY)
Admission: EM | Admit: 2019-03-26 | Discharge: 2019-03-26 | Disposition: A | Discharge status: Home / Self Care | Payer: Medicare Other | Attending: Emergency Medicine | Admitting: Emergency Medicine

## 2019-03-26 ENCOUNTER — Other Ambulatory Visit: Payer: Self-pay

## 2019-03-26 ENCOUNTER — Emergency Department (HOSPITAL_COMMUNITY): Payer: Medicare Other

## 2019-03-26 ENCOUNTER — Encounter (HOSPITAL_COMMUNITY): Payer: Self-pay | Admitting: Emergency Medicine

## 2019-03-26 DIAGNOSIS — R1013 Epigastric pain: Secondary | ICD-10-CM | POA: Diagnosis not present

## 2019-03-26 DIAGNOSIS — I10 Essential (primary) hypertension: Secondary | ICD-10-CM | POA: Diagnosis not present

## 2019-03-26 DIAGNOSIS — R079 Chest pain, unspecified: Secondary | ICD-10-CM | POA: Diagnosis not present

## 2019-03-26 DIAGNOSIS — J449 Chronic obstructive pulmonary disease, unspecified: Secondary | ICD-10-CM | POA: Insufficient documentation

## 2019-03-26 DIAGNOSIS — R0789 Other chest pain: Secondary | ICD-10-CM | POA: Insufficient documentation

## 2019-03-26 DIAGNOSIS — J45909 Unspecified asthma, uncomplicated: Secondary | ICD-10-CM | POA: Diagnosis not present

## 2019-03-26 DIAGNOSIS — K219 Gastro-esophageal reflux disease without esophagitis: Secondary | ICD-10-CM | POA: Insufficient documentation

## 2019-03-26 DIAGNOSIS — F17210 Nicotine dependence, cigarettes, uncomplicated: Secondary | ICD-10-CM | POA: Diagnosis not present

## 2019-03-26 LAB — LIPASE, BLOOD: Lipase: 30 U/L (ref 11–51)

## 2019-03-26 LAB — HEPATIC FUNCTION PANEL
ALT: 21 U/L (ref 0–44)
AST: 23 U/L (ref 15–41)
Albumin: 4.4 g/dL (ref 3.5–5.0)
Alkaline Phosphatase: 60 U/L (ref 38–126)
Bilirubin, Direct: 0.1 mg/dL (ref 0.0–0.2)
Indirect Bilirubin: 0.4 mg/dL (ref 0.3–0.9)
Total Bilirubin: 0.5 mg/dL (ref 0.3–1.2)
Total Protein: 7.4 g/dL (ref 6.5–8.1)

## 2019-03-26 LAB — BASIC METABOLIC PANEL
Anion gap: 8 (ref 5–15)
BUN: 16 mg/dL (ref 8–23)
CO2: 27 mmol/L (ref 22–32)
Calcium: 9.3 mg/dL (ref 8.9–10.3)
Chloride: 99 mmol/L (ref 98–111)
Creatinine, Ser: 0.88 mg/dL (ref 0.44–1.00)
GFR calc Af Amer: >60 mL/min (ref >60)
GFR calc non Af Amer: >60 mL/min (ref >60)
Glucose, Bld: 102 mg/dL — ABNORMAL HIGH (ref 70–99)
Potassium: 4.7 mmol/L (ref 3.5–5.1)
Sodium: 134 mmol/L — ABNORMAL LOW (ref 135–145)

## 2019-03-26 LAB — CBC
HCT: 38.7 % (ref 36.0–46.0)
Hemoglobin: 12.6 g/dL (ref 12.0–15.0)
MCH: 31.4 pg (ref 26.0–34.0)
MCHC: 32.6 g/dL (ref 30.0–36.0)
MCV: 96.5 fL (ref 80.0–100.0)
Platelets: 263 10*3/uL (ref 150–400)
RBC: 4.01 MIL/uL (ref 3.87–5.11)
RDW: 13.1 % (ref 11.5–15.5)
WBC: 9 10*3/uL (ref 4.0–10.5)
nRBC: 0 % (ref 0.0–0.2)

## 2019-03-26 LAB — TROPONIN I (HIGH SENSITIVITY)
Troponin I (High Sensitivity): 2 ng/L (ref <18)
Troponin I (High Sensitivity): 3 ng/L (ref <18)

## 2019-03-26 MED ORDER — HYDROCODONE-ACETAMINOPHEN 5-325 MG PO TABS: 1.0000 | ORAL_TABLET | Freq: Four times a day (QID) | ORAL | 0 refills | Status: DC | PRN

## 2019-03-26 MED ORDER — MORPHINE SULFATE (PF) 2 MG/ML IV SOLN
2.0000 mg | Freq: Once | INTRAVENOUS | Status: AC
  Administered 2019-03-26: 16:00:00 2 mg via INTRAVENOUS
  Filled 2019-03-26: qty 1

## 2019-03-26 MED ORDER — MORPHINE SULFATE (PF) 2 MG/ML IV SOLN
2.0000 mg | Freq: Once | INTRAVENOUS | Status: AC
  Administered 2019-03-26: 17:00:00 2 mg via INTRAVENOUS
  Filled 2019-03-26: qty 1

## 2019-03-26 MED ORDER — PANTOPRAZOLE SODIUM 20 MG PO TBEC: 20.0000 mg | DELAYED_RELEASE_TABLET | Freq: Every day | ORAL | 0 refills | Status: DC

## 2019-03-26 MED ORDER — LIDOCAINE VISCOUS HCL 2 % MT SOLN
15.0000 mL | Freq: Once | OROMUCOSAL | Status: AC
  Administered 2019-03-26: 15 mL via ORAL
  Filled 2019-03-26: qty 15

## 2019-03-26 MED ORDER — ONDANSETRON 4 MG PO TBDP: ORAL_TABLET | ORAL | 0 refills | Status: DC

## 2019-03-26 MED ORDER — ALUM & MAG HYDROXIDE-SIMETH 200-200-20 MG/5ML PO SUSP
30.0000 mL | Freq: Once | ORAL | Status: AC
  Administered 2019-03-26: 16:00:00 30 mL via ORAL
  Filled 2019-03-26: qty 30

## 2019-03-26 NOTE — ED Provider Notes (Signed)
[facility] EMERGENCY DEPARTMENT Provider Note   CSN: [id number] Arrival date & time: 03/26/19  1433     History Chief Complaint  Patient presents with  . Chest Pain    Alexis Ortega is a 74 y.o. female.  Patient complains of epigastric pain.  The history is provided by the patient. No language interpreter was used.  Chest Pain Pain location:  Epigastric Pain quality: aching   Pain radiates to:  Does not radiate Pain severity:  Moderate Onset quality:  Sudden Timing:  Constant Progression:  Waxing and waning Chronicity:  New Associated symptoms: no abdominal pain, no back pain, no cough, no fatigue and no headache        Past Medical History:  Diagnosis Date  . Arthritis   . Asthma   . Depression   . Fibromyalgia   . GERD (gastroesophageal reflux disease)   . Hypertension   . Hypothyroidism   . Thyroid disease     Patient Active Problem List   Diagnosis Date Noted  . COPD exacerbation ([place]) 12/13/2016  . Chest pain 10/01/2013  . Asthmatic bronchitis 07/11/2012  . Cough 07/11/2012  . Tobacco abuse 07/11/2012  . HTN (hypertension) 07/11/2012  . Hypothyroidism 07/11/2012  . Syncope 07/11/2012  . Hyponatremia 07/11/2012  . Anemia 07/11/2012    Past Surgical History:  Procedure Laterality Date  . ABDOMINAL HYSTERECTOMY    . BACK SURGERY    . CARPAL TUNNEL RELEASE Right   . CHOLECYSTECTOMY    . EYE SURGERY     cataract with lens implant  . KNEE ARTHROSCOPY Left   . LEFT HEART CATHETERIZATION WITH CORONARY ANGIOGRAM N/A 10/02/2013   Procedure: LEFT HEART CATHETERIZATION WITH CORONARY ANGIOGRAM;  Surgeon: Lorretta Harp, MD;  Location: [facility] CATH LAB;  Service: Cardiovascular;  Laterality: N/A;  . NECK SURGERY    . TOTAL KNEE ARTHROPLASTY Left 06/01/2012   Procedure: LEFT TOTAL KNEE ARTHROPLASTY;  Surgeon: Ninetta Lights, MD;  Location: [place];  Service: Orthopedics;  Laterality: Left;  . TUBAL LIGATION       OB History   No obstetric history on  file.     Family History  Problem Relation Age of Onset  . Allergies Son   . Asthma Daughter     Social History   Tobacco Use  . Smoking status: Current Every Day Smoker    Packs/day: 0.50    Years: 40.00    Pack years: 20.00    Types: Cigarettes    Last attempt to quit: 06/14/2012    Years since quitting: 6.7  . Smokeless tobacco: Never Used  Substance Use Topics  . Alcohol use: No  . Drug use: No    Home Medications Prior to Admission medications   Medication Sig Start Date End Date Taking? Authorizing Provider  cyclobenzaprine (FLEXERIL) 5 MG tablet Take 5 mg by mouth 3 (three) times daily. 12/26/18  Yes [provider]  DULoxetine (CYMBALTA) 30 MG capsule Take 30 mg by mouth daily. 01/09/19  Yes [provider]  ALPRAZolam Duanne Moron) 0.5 MG tablet Take 0.5 mg by mouth at bedtime.     [provider]  amLODipine (NORVASC) 10 MG tablet Take 10 mg by mouth daily. 03/30/14   [provider]  aspirin EC 81 MG tablet Take 324 mg by mouth once.     [provider]  cholecalciferol (VITAMIN D) 1000 units tablet Take 1,000 Units by mouth daily.    [provider]  citalopram (CELEXA) 20  MG tablet Take 20 mg by mouth daily.    [provider]  fentaNYL (DURAGESIC) 50 MCG/HR Place 1 patch onto the skin every 3 (three) days.    [provider]  HYDROcodone-acetaminophen (NORCO/VICODIN) 5-325 MG tablet Take 1 tablet by mouth every 6 (six) hours as needed. 03/26/19   Milton Ferguson, MD  levothyroxine (SYNTHROID) 100 MCG tablet Take 100 mcg by mouth daily before breakfast.     [provider]  losartan (COZAAR) 100 MG tablet Take 100 mg by mouth daily.    [provider]  Multiple Vitamin (MULTIVITAMIN WITH MINERALS) TABS tablet Take 1 tablet by mouth 2 (two) times a week.    [provider]  ondansetron (ZOFRAN ODT) 4 MG disintegrating tablet 4mg  ODT q4 hours prn nausea/vomit 03/26/19   Milton Ferguson, MD  pantoprazole (PROTONIX) 20 MG tablet Take 1 tablet (20 mg total) by mouth daily. 03/26/19   Milton Ferguson, MD  PROAIR HFA 108 (90 BASE) MCG/ACT inhaler Inhale 2 puffs into the lungs every 4 (four) hours as needed for wheezing.  08/24/13   [provider]  vitamin C (ASCORBIC ACID) 500 MG tablet Take 500 mg by mouth 2 (two) times a week.    [provider]    Allergies    Solu-medrol [methylprednisolone], Diclofenac, and Tramadol  Review of Systems   Review of Systems  Constitutional: Negative for appetite change and fatigue.  HENT: Negative for congestion, ear discharge and sinus pressure.   Eyes: Negative for discharge.  Respiratory: Negative for cough.   Cardiovascular: Positive for chest pain.  Gastrointestinal: Negative for abdominal pain and diarrhea.  Genitourinary: Negative for frequency and hematuria.  Musculoskeletal: Negative for back pain.  Skin: Negative for rash.  Neurological: Negative for seizures and headaches.  Psychiatric/Behavioral: Negative for hallucinations.    Physical Exam Updated Vital Signs BP (!) 130/53   Pulse 68   Temp 98 F (36.7 C) (Oral)   Resp 17   Ht 5\' 4"  (1.626 m)   Wt 72.6 kg   SpO2 94%   BMI 27.46 kg/m   Physical Exam Vitals and nursing note reviewed.  Constitutional:      Appearance: She is well-developed.  HENT:     Head: Normocephalic.     Nose: Nose normal.  Eyes:     General: No scleral icterus.    Conjunctiva/sclera: Conjunctivae normal.  Neck:     Thyroid: No thyromegaly.  Cardiovascular:     Rate and Rhythm: Normal rate and regular rhythm.     Heart sounds: No murmur. No friction rub. No gallop.   Pulmonary:     Breath sounds: No stridor. No wheezing or rales.  Chest:     Chest wall: No tenderness.  Abdominal:     General: There is no distension.     Tenderness: There is no abdominal tenderness. There is no rebound.     Comments: Tenderness to epigastric area  Musculoskeletal:         General: Normal range of motion.     Cervical back: Neck supple.  Lymphadenopathy:     Cervical: No cervical adenopathy.  Skin:    Findings: No erythema or rash.  Neurological:     Mental Status: She is alert and oriented to person, place, and time.     Motor: No abnormal muscle tone.     Coordination: Coordination normal.  Psychiatric:        Behavior: Behavior normal.     ED Results /  Procedures / Treatments   Labs (all labs ordered are listed, but only abnormal results are displayed) Labs Reviewed  BASIC METABOLIC PANEL - Abnormal; Notable for the following components:      Result Value   Sodium 134 (*)    Glucose, Bld 102 (*)    All other components within normal limits  CBC  HEPATIC FUNCTION PANEL  LIPASE, BLOOD  TROPONIN I (HIGH SENSITIVITY)  TROPONIN I (HIGH SENSITIVITY)    EKG EKG Interpretation  Date/Time:  Sunday March 26 2019 14:45:50 EST Ventricular Rate:  73 PR Interval:  174 QRS Duration: 76 QT Interval:  398 QTC Calculation: 438 R Axis:   36 Text Interpretation: Normal sinus rhythm Normal ECG Confirmed by Leena Tiede ([id number]) on 03/26/2019 3:20:05 PM   Radiology DG Chest 2 View  Result Date: 03/26/2019 CLINICAL DATA:  Chest pain. EXAM: CHEST - 2 VIEW COMPARISON:  September 21, 2018 FINDINGS: The heart, hila, and mediastinum are normal. No pneumothorax. No nodules or masses. Mild atelectasis in the bases. No suspicious infiltrates. No overt edema. IMPRESSION: No active cardiopulmonary disease. Electronically Signed   By: David  Williams III M.D   On: 03/26/2019 15:36    Procedures Procedures (including critical care time)  Medications Ordered in ED Medications  alum & mag hydroxide-simeth (MAALOX/MYLANTA) 200-200-20 MG/5ML suspension 30 mL (30 mLs Oral Given 03/26/19 1538)    And  lidocaine (XYLOCAINE) 2 % viscous mouth solution 15 mL (15 mLs Oral Given 03/26/19 1538)  morphine 2 MG/ML injection 2 mg (2 mg Intravenous Given 03/26/19 1540)  morphine  2 MG/ML injection 2 mg (2 mg Intravenous Given 03/26/19 1653)    ED Course  I have reviewed the triage vital signs and the nursing notes.  Pertinent labs & imaging results that were available during my care of the patient were reviewed by me and considered in my medical decision making (see chart for details).    MDM Rules/Calculators/A&P                      EKG normal.  CBC chemistries liver profile and troponins x2 were normal.  Patient improved some with pain medicine and Mylanta.  Doubt this is coronary artery disease with all troponins and EKG negative.  Patient will be placed on Protonix and given some pain and nausea medicine she is referred to audiology to see if they want to do some further test.  Patient did have a negative cath 5 years ago Final Clinical Impression(s) / ED Diagnoses Final diagnoses:  Atypical chest pain    Rx / DC Orders ED Discharge Orders         Ordered    pantoprazole (PROTONIX) 20 MG tablet  Daily     03/26/19 1847    HYDROcodone-acetaminophen (NORCO/VICODIN) 5-325 MG tablet  Every 6 hours PRN     03/26/19 1847    ondansetron (ZOFRAN ODT) 4 MG disintegrating tablet     01 /10/21 1847           Milton Ferguson, MD 03/26/19 [phone]

## 2019-03-26 NOTE — Discharge Instructions (Addendum)
Follow-up with Dr. Bronson Ing or one of his partners this week for recheck

## 2019-03-26 NOTE — ED Triage Notes (Signed)
Pt states that she has been having chest pain for over a hour. She denies any other symptoms

## 2019-03-28 ENCOUNTER — Encounter: Payer: Self-pay | Admitting: Cardiovascular Disease

## 2019-03-28 ENCOUNTER — Other Ambulatory Visit: Payer: Self-pay

## 2019-03-28 ENCOUNTER — Ambulatory Visit (INDEPENDENT_AMBULATORY_CARE_PROVIDER_SITE_OTHER): Payer: Medicare Other | Admitting: Cardiovascular Disease

## 2019-03-28 VITALS — BP 128/64 | HR 78 | Ht 64.5 in | Wt 153.0 lb

## 2019-03-28 DIAGNOSIS — I10 Essential (primary) hypertension: Secondary | ICD-10-CM

## 2019-03-28 DIAGNOSIS — R1310 Dysphagia, unspecified: Secondary | ICD-10-CM

## 2019-03-28 DIAGNOSIS — R079 Chest pain, unspecified: Secondary | ICD-10-CM

## 2019-03-28 NOTE — Progress Notes (Signed)
CARDIOLOGY CONSULT NOTE  Patient ID: Alexis Ortega MRN: [id number] DOB/AGE: 21-Apr-1945 74 y.o.  Admit date: (Not on file) Primary Physician: Neale Burly, MD  Reason for Consultation: Chest pain  HPI: Alexis Ortega is a 74 y.o. female who is being seen today for the evaluation of chest pain at the request of Hasanaj, Samul Dada, MD.   Past medical history includes COPD, tobacco use, and HTN.  She was evaluated in the ED on 03/26/19.  Troponins and lipase were normal.  Chest x-ray was normal.  CBC and BMET were unremarkable.  Symptoms were not felt to be cardiac in nature. She was started on Protonix.  I personally reviewed the ECG which demonstrated sinus rhythm with no ischemic ST segment or T wave abnormalities, nor any arrhythmias.  She underwent a normal coronary angiogram on 10/02/2013 by Dr. Gwenlyn Found.  Upon speaking with her further, she did not pick up her prescription for Protonix yet but used her husband's Protonix.  Symptoms occurred when she sat down to eat lunch and took a bite of a sandwich when it got "stuck about halfway down ".  After the food passed through her esophagus, she then experienced excruciating chest pain accompanied by shortness of breath.  She denies exertional chest pain.  She has occasional exertional dyspnea.  She smokes about a pack of cigarettes daily.  She sleeps in a recliner due to back issues related to her previous surgery after a fall.  She denies nausea and vomiting.  She denies abdominal pain.  Social history: Her son, Adalie Hanberry, is also my patient.  Her daughter, Wyman Songster, is a respiratory therapist at [employer].  Allergies  Allergen Reactions  . Solu-Medrol [Methylprednisolone]     Rash, itching, difficulty breathing  . Diclofenac Rash  . Tramadol Itching    Current Outpatient Medications  Medication Sig Dispense Refill  . ALPRAZolam (XANAX) 0.5 MG tablet Take 0.5 mg by mouth at bedtime.     Marland Kitchen  amLODipine (NORVASC) 10 MG tablet Take 10 mg by mouth daily.    . cholecalciferol (VITAMIN D) 1000 units tablet Take 1,000 Units by mouth daily.    . citalopram (CELEXA) 20 MG tablet Take 20 mg by mouth daily.    . fentaNYL (DURAGESIC) 50 MCG/HR Place 1 patch onto the skin every 3 (three) days.    Marland Kitchen HYDROcodone-acetaminophen (NORCO/VICODIN) 5-325 MG tablet Take 1 tablet by mouth every 6 (six) hours as needed. 12 tablet 0  . levothyroxine (SYNTHROID) 100 MCG tablet Take 100 mcg by mouth daily before breakfast.     . losartan (COZAAR) 100 MG tablet Take 100 mg by mouth daily.    . Multiple Vitamin (MULTIVITAMIN WITH MINERALS) TABS tablet Take 1 tablet by mouth 2 (two) times a week.    . ondansetron (ZOFRAN ODT) 4 MG disintegrating tablet 4mg  ODT q4 hours prn nausea/vomit 12 tablet 0  . pantoprazole (PROTONIX) 20 MG tablet Take 1 tablet (20 mg total) by mouth daily. 30 tablet 0  . PROAIR HFA 108 (90 BASE) MCG/ACT inhaler Inhale 2 puffs into the lungs every 4 (four) hours as needed for wheezing.     . vitamin C (ASCORBIC ACID) 500 MG tablet Take 500 mg by mouth 2 (two) times a week.     No current facility-administered medications for this visit.    Past Medical History:  Diagnosis Date  . Arthritis   . Asthma   . Depression   .  Fibromyalgia   . GERD (gastroesophageal reflux disease)   . Hypertension   . Hypothyroidism   . Thyroid disease     Past Surgical History:  Procedure Laterality Date  . ABDOMINAL HYSTERECTOMY    . BACK SURGERY    . CARPAL TUNNEL RELEASE Right   . CHOLECYSTECTOMY    . EYE SURGERY     cataract with lens implant  . KNEE ARTHROSCOPY Left   . LEFT HEART CATHETERIZATION WITH CORONARY ANGIOGRAM N/A 10/02/2013   Procedure: LEFT HEART CATHETERIZATION WITH CORONARY ANGIOGRAM;  Surgeon: Lorretta Harp, MD;  Location: [facility] C[facility] LAB;  Service: Cardiovascular;  Laterality: N/A;  . NECK SURGERY    . TOTAL KNEE ARTHROPLASTY Left 06/01/2012   Procedure: LEFT TOTAL KNEE  ARTHROPLASTY;  Surgeon: Ninetta Lights, MD;  Location: [place];  Service: Orthopedics;  Laterality: Left;  . TUBAL LIGATION      Social History   Socioeconomic History  . Marital status: Married    Spouse name: Not on file  . Number of children: Not on file  . Years of education: Not on file  . Highest education level: Not on file  Occupational History  . Not on file  Tobacco Use  . Smoking status: Current Every Day Smoker    Packs/day: 0.50    Years: 40.00    Pack years: 20.00    Types: Cigarettes    Last attempt to quit: 06/14/2012    Years since quitting: 6.7  . Smokeless tobacco: Never Used  Substance and Sexual Activity  . Alcohol use: No  . Drug use: No  . Sexual activity: Not on file  Other Topics Concern  . Not on file  Social History Narrative  . Not on file   Social Determinants of Health   Financial Resource Strain:   . Difficulty of Paying Living Expenses: Not on file  Food Insecurity:   . Worried About Charity fundraiser in the Last Year: Not on file  . Ran Out of Food in the Last Year: Not on file  Transportation Needs:   . Lack of Transportation (Medical): Not on file  . Lack of Transportation (Non-Medical): Not on file  Physical Activity:   . Days of Exercise per Week: Not on file  . Minutes of Exercise per Session: Not on file  Stress:   . Feeling of Stress : Not on file  Social Connections:   . Frequency of Communication with Friends and Family: Not on file  . Frequency of Social Gatherings with Friends and Family: Not on file  . Attends Religious Services: Not on file  . Active Member of Clubs or Organizations: Not on file  . Attends Archivist Meetings: Not on file  . Marital Status: Not on file  Intimate Partner Violence:   . Fear of Current or Ex-Partner: Not on file  . Emotionally Abused: Not on file  . Physically Abused: Not on file  . Sexually Abused: Not on file     No family history of premature CAD in 1st degree  relatives.  Current Meds  Medication Sig  . ALPRAZolam (XANAX) 0.5 MG tablet Take 0.5 mg by mouth at bedtime.   Marland Kitchen amLODipine (NORVASC) 10 MG tablet Take 10 mg by mouth daily.  . cholecalciferol (VITAMIN D) 1000 units tablet Take 1,000 Units by mouth daily.  . citalopram (CELEXA) 20 MG tablet Take 20 mg by mouth daily.  . fentaNYL (DURAGESIC) 50 MCG/HR Place 1 patch onto the  skin every 3 (three) days.  Marland Kitchen HYDROcodone-acetaminophen (NORCO/VICODIN) 5-325 MG tablet Take 1 tablet by mouth every 6 (six) hours as needed.  Marland Kitchen levothyroxine (SYNTHROID) 100 MCG tablet Take 100 mcg by mouth daily before breakfast.   . losartan (COZAAR) 100 MG tablet Take 100 mg by mouth daily.  . Multiple Vitamin (MULTIVITAMIN WITH MINERALS) TABS tablet Take 1 tablet by mouth 2 (two) times a week.  . ondansetron (ZOFRAN ODT) 4 MG disintegrating tablet 4mg  ODT q4 hours prn nausea/vomit  . pantoprazole (PROTONIX) 20 MG tablet Take 1 tablet (20 mg total) by mouth daily.  Marland Kitchen PROAIR HFA 108 (90 BASE) MCG/ACT inhaler Inhale 2 puffs into the lungs every 4 (four) hours as needed for wheezing.   . vitamin C (ASCORBIC ACID) 500 MG tablet Take 500 mg by mouth 2 (two) times a week.      Review of systems complete and found to be negative unless listed above in HPI    Physical exam Blood pressure 128/64, pulse 78, height 5' 4.5" (1.638 m), weight 153 lb (69.4 kg), SpO2 96 %. General: NAD Neck: No JVD, no thyromegaly or thyroid nodule.  Lungs: Poor air movement, no crackles or wheezes. CV: Nondisplaced PMI. Regular rate and rhythm, normal S1/S2, no S3/S4, no murmur.  No peripheral edema.  No carotid bruit.    Abdomen: Soft, nontender, no distention.  Skin: Intact without lesions or rashes.  Neurologic: Alert and oriented x 3.  Psych: Normal affect. Extremities: No clubbing or cyanosis.  HEENT: Normal.   ECG: Most recent ECG reviewed.   Labs: Lab Results  Component Value Date/Time   K 4.7 03/26/2019 03:08 PM   BUN 16  03/26/2019 03:08 PM   CREATININE 0.88 03/26/2019 03:08 PM   ALT 21 03/26/2019 03:30 PM   TSH 7.914 (H) 07/13/2012 05:05 AM   HGB 12.6 03/26/2019 03:08 PM     Lipids: Lab Results  Component Value Date/Time   LDLCALC 115 (H) 10/02/2013 07:04 AM   CHOL 207 (H) 10/02/2013 07:04 AM   TRIG 89 10/02/2013 07:04 AM   HDL 74 10/02/2013 07:04 AM        ASSESSMENT AND PLAN:  1.  Chest pain: Symptoms appear to be atypical in nature for a cardiac etiology but point to an esophageal etiology.  She does have a history of esophageal dilatation approximately 15 years ago.  She underwent a normal coronary angiogram on 10/02/2013.  She has been prescribed Protonix by the ED physician.  I will make a referral to GI.  No cardiac testing is indicated at this time.  2.  Hypertension: Blood pressure is normal.  No changes.  3.  Dysphagia for solids: Symptoms began shortly after eating.  She has a history of esophageal dilatation and I wonder if she has esophageal stenosis and needs another dilatation.  I will make a referral to GI.     Disposition: Follow up in 3 months virtual visit  Signed: Kate Sable, M.D., F.A.C.C.  03/28/2019, 1:26 PM

## 2019-03-28 NOTE — Patient Instructions (Addendum)
Medication Instructions:    Your physician recommends that you continue on your current medications as directed. Please refer to the Current Medication list given to you today.  Labwork:  NONE  Testing/Procedures:  NONE  Follow-Up:  Your physician recommends that you schedule a follow-up appointment in: 3 months (virtual)  Any Other Special Instructions Will Be Listed Below (If Applicable).  You have been referred to a gastroenterologist.  If you need a refill on your cardiac medications before your next appointment, please call your pharmacy.

## 2019-03-29 ENCOUNTER — Encounter (INDEPENDENT_AMBULATORY_CARE_PROVIDER_SITE_OTHER): Payer: Self-pay | Admitting: Gastroenterology

## 2019-05-03 ENCOUNTER — Encounter (INDEPENDENT_AMBULATORY_CARE_PROVIDER_SITE_OTHER): Payer: Self-pay | Admitting: Gastroenterology

## 2019-05-03 ENCOUNTER — Ambulatory Visit (INDEPENDENT_AMBULATORY_CARE_PROVIDER_SITE_OTHER): Payer: Medicare Other | Admitting: Gastroenterology

## 2019-05-03 ENCOUNTER — Other Ambulatory Visit: Payer: Self-pay

## 2019-05-03 VITALS — BP 143/74 | HR 81 | Temp 97.1°F | Ht 64.5 in | Wt 160.7 lb

## 2019-05-03 DIAGNOSIS — R0789 Other chest pain: Secondary | ICD-10-CM

## 2019-05-03 DIAGNOSIS — R1310 Dysphagia, unspecified: Secondary | ICD-10-CM | POA: Diagnosis not present

## 2019-05-03 NOTE — Patient Instructions (Signed)
We are arranging endoscopy for evaluation of trouble swallowing.  Make sure you are chewing very well and eating small bites particularly when eating meats

## 2019-05-03 NOTE — Progress Notes (Signed)
Patient profile: Alexis Ortega is a 74 y.o. female seen for evaluation of dysphagia.  History of Present Illness: Alexis Ortega is seen today for dysphagia and chest pain.  She reports dysphagia beginning about a year ago.  Seems to be more so with meats including hamburger and steak. Typically in upper esophageal area.  May have to regurgitate meats and spit out at times.  Happening fairly often, last episode was last night.  She denies any classic GERD symptoms.  She started Protonix 20 mg once a day for some atypical chest pain as below.  Has not seen significant improvement in dysphagia.  She denies nausea vomiting.  Reports 2 weeks ago developed chest pain, she was seen in the ER and by cardiology.  Reports it can occur in her mid chest radiating to her back and become "unbearable".  Patient reports today it is not related to eating, per cardiology notes it began after an episode of dysphagia.  Does not relate to movement or exertion..  She has no nausea or vomiting or belching with the pain.  Has not found any triggers.  She does feel it radiates around her back where she has chronic severe back pain.  She has a bowel movement daily without any lower GI symptoms.  No constipation, diarrhea, melena, rectal bleeding.  She does not eat late meals.  She does not use NSAIDs.  No alcohol.  Is trying to stop smoking, wearing patch over the past 2 days.  Generally sleeps in a recliner due to chronic back pain.   No nsaids.   Wt Readings from Last 3 Encounters:  05/03/19 160 lb 11.2 oz (72.9 kg)  03/28/19 153 lb (69.4 kg)  03/26/19 160 lb (72.6 kg)     Last Colonoscopy: per patient 2019 with Dr. Ladona Horns Last Endoscopy: 2012--Schatzki's ring, dilated 16.5 mm, mild esophagitis.  Past Medical History:  Past Medical History:  Diagnosis Date   Arthritis    Asthma    Depression    Fibromyalgia    GERD (gastroesophageal reflux disease)    Hypertension    Hypothyroidism    Thyroid  disease     Problem List: Patient Active Problem List   Diagnosis Date Noted   COPD exacerbation ([place]) 12/13/2016   Chest pain 10/01/2013   Asthmatic bronchitis 07/11/2012   Cough 07/11/2012   Tobacco abuse 07/11/2012   HTN (hypertension) 07/11/2012   Hypothyroidism 07/11/2012   Syncope 07/11/2012   Hyponatremia 07/11/2012   Anemia 07/11/2012    Past Surgical History: Past Surgical History:  Procedure Laterality Date   ABDOMINAL HYSTERECTOMY     BACK SURGERY     CARPAL TUNNEL RELEASE Right    CHOLECYSTECTOMY     EYE SURGERY     cataract with lens implant   KNEE ARTHROSCOPY Left    LEFT HEART CATHETERIZATION WITH CORONARY ANGIOGRAM N/A 10/02/2013   Procedure: LEFT HEART CATHETERIZATION WITH CORONARY ANGIOGRAM;  Surgeon: Lorretta Harp, MD;  Location[facility] CATH LAB;  Service: Cardiovascular;  Laterality: N/A;   NECK SURGERY     TOTAL KNEE ARTHROPLASTY Left 06/01/2012   Procedure: LEFT TOTAL KNEE ARTHROPLASTY;  Surgeon: Ninetta Lights, MD;  Location: [place];  Service: Orthopedics;  Laterality: Left;   TUBAL LIGATION      Allergies: Allergies  Allergen Reactions   Solu-Medrol [Methylprednisolone]     Rash, itching, difficulty breathing   Diclofenac Rash   Tramadol Itching      Home Medications:  Current Outpatient  Medications:    acetaminophen (TYLENOL) 500 MG tablet, Take 500 mg by mouth. Patient will take 2 by mouth as needed for pain., Disp: , Rfl:    ALPRAZolam (XANAX) 0.5 MG tablet, Take 0.5 mg by mouth at bedtime. , Disp: , Rfl:    amLODipine (NORVASC) 10 MG tablet, Take 10 mg by mouth daily., Disp: , Rfl:    Biotin 10000 MCG TBDP, Take by mouth daily., Disp: , Rfl:    cholecalciferol (VITAMIN D) 1000 units tablet, Take 2,000 Units by mouth daily. , Disp: , Rfl:    citalopram (CELEXA) 20 MG tablet, Take 20 mg by mouth daily., Disp: , Rfl:    fentaNYL (DURAGESIC) 50 MCG/HR, Place 1 patch onto the skin every 3 (three) days., Disp: , Rfl:     levothyroxine (SYNTHROID) 100 MCG tablet, Take 100 mcg by mouth daily before breakfast. , Disp: , Rfl:    losartan (COZAAR) 100 MG tablet, Take 100 mg by mouth daily., Disp: , Rfl:    Multiple Vitamin (MULTIVITAMIN WITH MINERALS) TABS tablet, Take 1 tablet by mouth 2 (two) times a week., Disp: , Rfl:    pantoprazole (PROTONIX) 20 MG tablet, Take 1 tablet (20 mg total) by mouth daily., Disp: 30 tablet, Rfl: 0   PROAIR HFA 108 (90 BASE) MCG/ACT inhaler, Inhale 2 puffs into the lungs every 4 (four) hours as needed for wheezing. , Disp: , Rfl:    vitamin C (ASCORBIC ACID) 500 MG tablet, Take 500 mg by mouth 2 (two) times a week., Disp: , Rfl:    Zinc 50 MG CAPS, Take by mouth daily., Disp: , Rfl:    ondansetron (ZOFRAN ODT) 4 MG disintegrating tablet, 4mg  ODT q4 hours prn nausea/vomit (Patient not taking: Reported on 05/03/2019), Disp: 12 tablet, Rfl: 0   Family History: family history includes Allergies in her son; Asthma in her daughter.    No family history colon polyps or colon cancer   Social History:   reports that she has been smoking cigarettes. She has a 20.00 pack-year smoking history. She has never used smokeless tobacco. She reports that she does not drink alcohol or use drugs.   Currently wearing patch-last cigarette 2 days ago.    Review of Systems: Constitutional: Denies weight loss/weight gain  Eyes: No changes in vision. ENT: No oral lesions, sore throat.  GI: see HPI.  Heme/Lymph: No easy bruising.  CV: No chest pain.  GU: No hematuria.  Integumentary: No rashes.  Neuro: No headaches.  Psych: No depression/anxiety.  Endocrine: No heat/cold intolerance.  Allergic/Immunologic: No urticaria.  Resp: No cough, SOB.  Musculoskeletal: No joint swelling.    Physical Examination: BP (!) 143/74 (BP Location: Right Arm, Patient Position: Sitting, Cuff Size: Large)    Pulse 81    Temp (!) 97.1 F (36.2 C) (Temporal)    Ht 5' 4.5" (1.638 m)    Wt 160 lb 11.2 oz (72.9  kg)    BMI 27.16 kg/m  Gen: NAD, alert and oriented x 4 HEENT: PEERLA, EOMI, Neck: supple, no JVD Chest: CTA bilaterally, no wheezes, crackles, or other adventitious sounds CV: RRR, no m/g/c/r Abd: soft, NT, ND, +BS in all four quadrants; no HSM, guarding, ridigity, or rebound tenderness Ext: no edema, well perfused with 2+ pulses, Skin: no rash or lesions noted on observed skin Lymph: no noted LAD  Data Reviewed:  Cardiology notes-chest pain felt related to esophageal etiology.   Assessment/Plan: Ms. Buri is a 74 y.o. female  Chareen was seen today for new patient (initial visit).  Diagnoses and all orders for this visit:  Dysphagia, unspecified type  Atypical chest pain    1.  Dysphagia-last endoscopy 2012 with dilation.  Needs repeat endoscopy for evaluation.  Given she is on a fentanyl patch chronically we will plan for propofol.  She denies any past issues with sedation.  She is on Protonix 20 mg and does not feel a lot of GERD symptoms, recommend she continue until EGD in case has mild esophagitis.  We discussed diet modifications.  2.  History of colon polyps-up-to-date with colonoscopies per patient.  Has these with Dr. Ladona Horns  3.  Chest pain-has been seen in the emergency room with negative initial evaluation.  Reported to cardiology relates dysphagia but today feels it is more related to her back pain.  Further recommendations pending endoscopy report.   Patient denies CP, SOB, and use of blood thinners. I discussed the risks and benefits of procedure including bleeding, perforation, infection, missed lesions, medication reactions and possible hospitalization or surgery if complications. All questions answered.      I personally performed the service, non-incident to. (WP)  Laurine Blazer, [facility]

## 2019-05-06 DIAGNOSIS — M5414 Radiculopathy, thoracic region: Secondary | ICD-10-CM | POA: Diagnosis not present

## 2019-05-11 DIAGNOSIS — F418 Other specified anxiety disorders: Secondary | ICD-10-CM | POA: Diagnosis not present

## 2019-05-11 DIAGNOSIS — J449 Chronic obstructive pulmonary disease, unspecified: Secondary | ICD-10-CM | POA: Diagnosis not present

## 2019-05-11 DIAGNOSIS — M545 Low back pain: Secondary | ICD-10-CM | POA: Diagnosis not present

## 2019-05-16 DIAGNOSIS — Z23 Encounter for immunization: Secondary | ICD-10-CM | POA: Diagnosis not present

## 2019-05-19 DIAGNOSIS — M5414 Radiculopathy, thoracic region: Secondary | ICD-10-CM | POA: Diagnosis not present

## 2019-05-23 ENCOUNTER — Telehealth (INDEPENDENT_AMBULATORY_CARE_PROVIDER_SITE_OTHER): Payer: Self-pay | Admitting: *Deleted

## 2019-05-23 ENCOUNTER — Encounter (INDEPENDENT_AMBULATORY_CARE_PROVIDER_SITE_OTHER): Payer: Self-pay | Admitting: *Deleted

## 2019-05-23 NOTE — Telephone Encounter (Signed)
Made several attempts to contact patient to schedule EGD/ED with MAC, patient hasn't returned calls, letter mailed asking patient to call me to schedule

## 2019-06-13 DIAGNOSIS — Z23 Encounter for immunization: Secondary | ICD-10-CM | POA: Diagnosis not present

## 2019-07-06 ENCOUNTER — Telehealth: Payer: Self-pay | Admitting: Cardiovascular Disease

## 2019-07-06 NOTE — Telephone Encounter (Signed)
No, she does not need to have that appointment.  In fact, she can follow-up as needed.

## 2019-07-06 NOTE — Telephone Encounter (Signed)
Patient informed via voicemail.

## 2019-07-06 NOTE — Telephone Encounter (Signed)
Patient would like to know if she need her appointment on 07/11/19 she went to Gastrologist but has not had throat stretched

## 2019-07-11 ENCOUNTER — Telehealth: Payer: Medicare Other | Admitting: Cardiovascular Disease

## 2019-07-13 DIAGNOSIS — J449 Chronic obstructive pulmonary disease, unspecified: Secondary | ICD-10-CM | POA: Diagnosis not present

## 2019-07-13 DIAGNOSIS — M545 Low back pain: Secondary | ICD-10-CM | POA: Diagnosis not present

## 2019-07-13 DIAGNOSIS — F418 Other specified anxiety disorders: Secondary | ICD-10-CM | POA: Diagnosis not present

## 2019-08-08 DIAGNOSIS — J440 Chronic obstructive pulmonary disease with acute lower respiratory infection: Secondary | ICD-10-CM | POA: Diagnosis not present

## 2019-08-30 DIAGNOSIS — M545 Low back pain: Secondary | ICD-10-CM | POA: Diagnosis not present

## 2019-11-21 DIAGNOSIS — R5383 Other fatigue: Secondary | ICD-10-CM | POA: Diagnosis not present

## 2019-11-21 DIAGNOSIS — F411 Generalized anxiety disorder: Secondary | ICD-10-CM | POA: Diagnosis not present

## 2019-11-21 DIAGNOSIS — M545 Low back pain: Secondary | ICD-10-CM | POA: Diagnosis not present

## 2019-11-22 DIAGNOSIS — M545 Low back pain: Secondary | ICD-10-CM | POA: Diagnosis not present

## 2019-11-22 DIAGNOSIS — R5383 Other fatigue: Secondary | ICD-10-CM | POA: Diagnosis not present

## 2019-11-22 DIAGNOSIS — F411 Generalized anxiety disorder: Secondary | ICD-10-CM | POA: Diagnosis not present

## 2019-12-19 DIAGNOSIS — Z23 Encounter for immunization: Secondary | ICD-10-CM | POA: Diagnosis not present

## 2020-01-29 DIAGNOSIS — E032 Hypothyroidism due to medicaments and other exogenous substances: Secondary | ICD-10-CM | POA: Diagnosis not present

## 2020-01-29 DIAGNOSIS — F411 Generalized anxiety disorder: Secondary | ICD-10-CM | POA: Diagnosis not present

## 2020-01-29 DIAGNOSIS — Z0000 Encounter for general adult medical examination without abnormal findings: Secondary | ICD-10-CM | POA: Diagnosis not present

## 2020-01-29 DIAGNOSIS — Z1331 Encounter for screening for depression: Secondary | ICD-10-CM | POA: Diagnosis not present

## 2020-01-29 DIAGNOSIS — J440 Chronic obstructive pulmonary disease with acute lower respiratory infection: Secondary | ICD-10-CM | POA: Diagnosis not present

## 2020-01-29 DIAGNOSIS — M545 Low back pain, unspecified: Secondary | ICD-10-CM | POA: Diagnosis not present

## 2020-02-06 DIAGNOSIS — Z23 Encounter for immunization: Secondary | ICD-10-CM | POA: Diagnosis not present

## 2020-02-15 ENCOUNTER — Ambulatory Visit (INDEPENDENT_AMBULATORY_CARE_PROVIDER_SITE_OTHER): Payer: Medicare Other | Admitting: Gastroenterology

## 2020-02-15 ENCOUNTER — Other Ambulatory Visit (INDEPENDENT_AMBULATORY_CARE_PROVIDER_SITE_OTHER): Payer: Self-pay

## 2020-02-15 ENCOUNTER — Encounter (INDEPENDENT_AMBULATORY_CARE_PROVIDER_SITE_OTHER): Payer: Self-pay | Admitting: Gastroenterology

## 2020-02-15 ENCOUNTER — Other Ambulatory Visit: Payer: Self-pay

## 2020-02-15 ENCOUNTER — Encounter (INDEPENDENT_AMBULATORY_CARE_PROVIDER_SITE_OTHER): Payer: Self-pay

## 2020-02-15 DIAGNOSIS — R1319 Other dysphagia: Secondary | ICD-10-CM | POA: Diagnosis not present

## 2020-02-15 DIAGNOSIS — R1310 Dysphagia, unspecified: Secondary | ICD-10-CM | POA: Insufficient documentation

## 2020-02-15 NOTE — Progress Notes (Signed)
Maylon Peppers, M.D. Gastroenterology & Hepatology [facility] [address] [place], [place] [address]  Primary Care Physician: Neale Burly, MD [place] [address]  I will communicate my assessment and recommendations to the referring MD via EMR. "Note: Occasional unusual wording and randomly placed punctuation marks may result from the use of speech recognition technology to transcribe this document"  Problems: 1. Dysphagia 2. History of Schatzki's ring status post dilation in 2012  History of Present Illness: Alexis Ortega is a 74 y.o. female with PMH depression, FBM, hypothyroidism, HTN, multiple back surgeries, and history of Schatzki's ring, who presents for follow up of dysphagia.   The patient was last seen on 05/03/2019. At that time, the patient was ordered to have an EGD for evaluation of worsening dysphagia, however the patient did not answer the phone calls to schedule her procedure.  The patient states that she may have disregarded the calls as she did not recognize the number and thought it was a spam call.  Patient reports that for the last 2 years she has presented worsening dysphagia to solids. Patient states that she has presented episodes in which "food got lodged in the middle of her chest" after which she had to induce vomiting to relief the pressure. She states that after these episodes she had significant chest pain in the retrosternal area. Patient got concerned as the last week she ate shredded pieces of loaf bread and beef, felt the food got suck and it did not improve when drinking water. She states that she actually had to vomit the water as it did not go down. She had to induce the vomiting to improve the obstruction. Denies any odynophagia but states that there was soreness after her recent event. Patient states that she does not have frequent heartburn episodes. She has lost 5 lb in the  last few weeks.  Overall, she reports that she tries to chew her food thoroughly and eat small pieces to avoid having any more dysphagia episodes.  The patient denies having any nausea, fever, chills, hematochezia, melena, hematemesis, abdominal distention, abdominal pain, diarrhea, jaundice, pruritus.  Last EGD: 2012--Schatzki's ring, dilated 16.5 mm, mild esophagitis. Last Colonoscopy: per patient 2019 with Dr. Ladona Horns, had colon polyps per patient and was advised to repeat in 5 years. No report available.  Past Medical History: Past Medical History:  Diagnosis Date  . Arthritis   . Asthma   . Depression   . Fibromyalgia   . GERD (gastroesophageal reflux disease)   . Hypertension   . Hypothyroidism   . Thyroid disease     Past Surgical History: Past Surgical History:  Procedure Laterality Date  . ABDOMINAL HYSTERECTOMY    . BACK SURGERY    . CARPAL TUNNEL RELEASE Right   . CHOLECYSTECTOMY    . EYE SURGERY     cataract with lens implant  . KNEE ARTHROSCOPY Left   . LEFT HEART CATHETERIZATION WITH CORONARY ANGIOGRAM N/A 10/02/2013   Procedure: LEFT HEART CATHETERIZATION WITH CORONARY ANGIOGRAM;  Surgeon: Lorretta Harp, MD;  Location: [facility] CATH LAB;  Service: Cardiovascular;  Laterality: N/A;  . NECK SURGERY    . TOTAL KNEE ARTHROPLASTY Left 06/01/2012   Procedure: LEFT TOTAL KNEE ARTHROPLASTY;  Surgeon: Ninetta Lights, MD;  Location: [facility];  Service: Orthopedics;  Laterality: Left;  . TUBAL LIGATION      Family History: Family History  Problem Relation Age of Onset  .  Allergies Son   . Asthma Daughter     Social History: Social History   Tobacco Use  Smoking Status Current Every Day Smoker  . Packs/day: 0.50  . Years: 40.00  . Pack years: 20.00  . Types: Cigarettes  . Last attempt to quit: 06/14/2012  . Years since quitting: 7.6  Smokeless Tobacco Never Used   Social History   Substance and Sexual Activity  Alcohol Use No   Social History   Substance  and Sexual Activity  Drug Use Yes   Comment: fentanyl patch Q three days    Allergies: Allergies  Allergen Reactions  . Solu-Medrol [Methylprednisolone]     Rash, itching, difficulty breathing  . Diclofenac Rash  . Tramadol Itching    Medications: Current Outpatient Medications  Medication Sig Dispense Refill  . acetaminophen (TYLENOL) 500 MG tablet Take 500 mg by mouth. Patient will take 2 by mouth as needed for pain.    Marland Kitchen ALPRAZolam (XANAX) 0.5 MG tablet Take 0.5 mg by mouth at bedtime.     Marland Kitchen amLODipine (NORVASC) 10 MG tablet Take 10 mg by mouth daily.    . Biotin 10000 MCG TBDP Take by mouth daily.    . cholecalciferol (VITAMIN D) 1000 units tablet Take 2,000 Units by mouth daily.     . citalopram (CELEXA) 20 MG tablet Take 20 mg by mouth daily.    . fentaNYL (DURAGESIC) 50 MCG/HR Place 1 patch onto the skin every 3 (three) days.    Marland Kitchen levothyroxine (SYNTHROID) 100 MCG tablet Take 100 mcg by mouth daily before breakfast.     . losartan (COZAAR) 100 MG tablet Take 100 mg by mouth daily.    . Multiple Vitamin (MULTIVITAMIN WITH MINERALS) TABS tablet Take 1 tablet by mouth 2 (two) times a week.    . ondansetron (ZOFRAN ODT) 4 MG disintegrating tablet 4mg  ODT q4 hours prn nausea/vomit 12 tablet 0  . PROAIR HFA 108 (90 BASE) MCG/ACT inhaler Inhale 2 puffs into the lungs every 4 (four) hours as needed for wheezing.     . vitamin C (ASCORBIC ACID) 500 MG tablet Take 500 mg by mouth 2 (two) times a week.    . Zinc 50 MG CAPS Take by mouth daily.    . pantoprazole (PROTONIX) 20 MG tablet Take 1 tablet (20 mg total) by mouth daily. (Patient not taking: Reported on 02/15/2020) 30 tablet 0   No current facility-administered medications for this visit.    Review of Systems: GENERAL: negative for malaise, night sweats HEENT: No changes in hearing or vision, no nose bleeds or other nasal problems. NECK: Negative for lumps, goiter, pain and significant neck swelling RESPIRATORY: Negative for  cough, wheezing CARDIOVASCULAR: Negative for chest pain, leg swelling, palpitations, orthopnea GI: SEE HPI MUSCULOSKELETAL: Negative for joint pain or swelling, back pain, and muscle pain. SKIN: Negative for lesions, rash PSYCH: Negative for sleep disturbance, mood disorder and recent psychosocial stressors. HEMATOLOGY Negative for prolonged bleeding, bruising easily, and swollen nodes. ENDOCRINE: Negative for cold or heat intolerance, polyuria, polydipsia and goiter. NEURO: negative for tremor, gait imbalance, syncope and seizures. The remainder of the review of systems is noncontributory.   Physical Exam: BP (!) 145/77 (BP Location: Left Arm, Patient Position: Sitting, Cuff Size: Large)   Pulse 78   Temp 97.7 F (36.5 C) (Oral)   Ht 5\' 4"  (1.626 m)   Wt 157 lb (71.2 kg)   BMI 26.95 kg/m  GENERAL: The patient is AO x3, in no acute  distress. Elder. HEENT: Head is normocephalic and atraumatic. EOMI are intact. Mouth is well hydrated and without lesions. NECK: Supple. No masses LUNGS: Clear to auscultation. No presence of rhonchi/wheezing/rales. Adequate chest expansion HEART: RRR, normal s1 and s2. ABDOMEN: Soft, nontender, no guarding, no peritoneal signs, and nondistended. BS +. No masses. EXTREMITIES: Without any cyanosis, clubbing, rash, lesions or edema. NEUROLOGIC: AOx3, no focal motor deficit. SKIN: no jaundice, no rashes  Imaging/Labs: as above  I personally reviewed and interpreted the available labs, imaging and endoscopic files.  Impression and Plan: Alexis Ortega is a 74 y.o. female with PMH depression, FBM, hypothyroidism, HTN, multiple back surgeries, and history of Schatzki's ring, who presents for follow up of dysphagia.  The patient had evidence of progressive dysphagia for the last few years of unclear etiology but given her history of Schatzki's ring it is very likely this is related to recurrent strictures/rings that will require endoscopic dilation.  She has  not presented any episodes of heartburn but if she has evidence of stricturing disease, will need to keep her on a PPI at least once a day.  We will proceed with a esophagogastroduodenospy, explained this to the patient who understood and agreed.  I advised her to avoid eating any solid food today before of her procedure to avoid any food presence during her procedure, but I also advised her to try to avoid food that is too dry or that could get stuck in her esophagus until the procedure is performed.  The patient understood and agreed.  - Schedule EGD with ED  All questions were answered.      Harvel Quale, MD Gastroenterology and Hepatology [facility]

## 2020-02-15 NOTE — Patient Instructions (Signed)
Schedule EGD with ED

## 2020-03-26 NOTE — Patient Instructions (Signed)
Alexis Ortega  03/26/2020     @PREFPERIOPPHARMACY @   Your procedure is scheduled on  03/29/2020.  Report to 03/31/2020 at  0800  A.M.  Call this number if you have problems the morning of surgery:  [phone]   Remember:  Follow the diet instructions given to you by the office.                      Take these medicines the morning of surgery with A SIP OF WATER  Amlodipine, celexa, gabapentin, levothyroxine, zofran(If needed). Use your inhaler before you come.    Do not wear jewelry, make-up or nail polish.  Do not wear lotions, powders, or perfumes, or deodorant. Please brush your teeth.  Do not shave 48 hours prior to surgery.  Men may shave face and neck.  Do not bring valuables to the hospital.  [facility] is not responsible for any belongings or valuables.  Contacts, dentures or bridgework may not be worn into surgery.  Leave your suitcase in the car.  After surgery it may be brought to your room.  For patients admitted to the hospital, discharge time will be determined by your treatment team.  Patients discharged the day of surgery will not be allowed to drive home.   Name and phone number of your driver:   family Special instructions:  DO NOT smoke the morning of your procedure.  Please read over the following fact sheets that you were given. Anesthesia Post-op Instructions and Care and Recovery After Surgery       Upper Endoscopy, Adult, Care After This sheet gives you information about how to care for yourself after your procedure. Your health care provider may also give you more specific instructions. If you have problems or questions, contact your health care provider. What can I expect after the procedure? After the procedure, it is common to have:  A sore throat.  Mild stomach pain or discomfort.  Bloating.  Nausea. Follow these instructions at home:  Follow instructions from your health care provider about what to eat or drink after  your procedure.  Return to your normal activities as told by your health care provider. Ask your health care provider what activities are safe for you.  Take over-the-counter and prescription medicines only as told by your health care provider.  If you were given a sedative during the procedure, it can affect you for several hours. Do not drive or operate machinery until your health care provider says that it is safe.  Keep all follow-up visits as told by your health care provider. This is important.   Contact a health care provider if you have:  A sore throat that lasts longer than one day.  Trouble swallowing. Get help right away if:  You vomit blood or your vomit looks like coffee grounds.  You have: ? A fever. ? Bloody, black, or tarry stools. ? A severe sore throat or you cannot swallow. ? Difficulty breathing. ? Severe pain in your chest or abdomen. Summary  After the procedure, it is common to have a sore throat, mild stomach discomfort, bloating, and nausea.  If you were given a sedative during the procedure, it can affect you for several hours. Do not drive or operate machinery until your health care provider says that it is safe.  Follow instructions from your health care provider about what to eat or drink after your procedure.  Return  to your normal activities as told by your health care provider. This information is not intended to replace advice given to you by your health care provider. Make sure you discuss any questions you have with your health care provider. Document Revised: 02/28/2019 Document Reviewed: 08/02/2017 Elsevier Patient Education  2021 [place].  https://www.asge.org/home/for-patients/patient-information/understanding-eso-dilation-updated">  Esophageal Dilatation Esophageal dilatation, also called esophageal dilation, is a procedure to widen or open a blocked or narrowed part of the esophagus. The esophagus is the part of the body that moves  food and liquid from the mouth to the stomach. You may need this procedure if:  You have a buildup of scar tissue in your esophagus that makes it difficult, painful, or impossible to swallow. This can be caused by gastroesophageal reflux disease (GERD).  You have cancer of the esophagus.  There is a problem with how food moves through your esophagus. In some cases, you may need this procedure repeated at a later time to dilate the esophagus gradually. Tell a health care provider about:  Any allergies you have.  All medicines you are taking, including vitamins, herbs, eye drops, creams, and over-the-counter medicines.  Any problems you or family members have had with anesthetic medicines.  Any blood disorders you have.  Any surgeries you have had.  Any medical conditions you have.  Any antibiotic medicines you are required to take before dental procedures.  Whether you are pregnant or may be pregnant. What are the risks? Generally, this is a safe procedure. However, problems may occur, including:  Bleeding due to a tear in the lining of the esophagus.  A hole, or perforation, in the esophagus. What happens before the procedure?  Ask your health care provider about: ? Changing or stopping your regular medicines. This is especially important if you are taking diabetes medicines or blood thinners. ? Taking medicines such as aspirin and ibuprofen. These medicines can thin your blood. Do not take these medicines unless your health care provider tells you to take them. ? Taking over-the-counter medicines, vitamins, herbs, and supplements.  Follow instructions from your health care provider about eating or drinking restrictions.  Plan to have a responsible adult take you home from the hospital or clinic.  Plan to have a responsible adult care for you for the time you are told after you leave the hospital or clinic. This is important. What happens during the procedure?  You may be  given a medicine to help you relax (sedative).  A numbing medicine may be sprayed into the back of your throat, or you may gargle the medicine.  Your health care provider may perform the dilatation using various surgical instruments, such as: ? Simple dilators. This instrument is carefully placed in the esophagus to stretch it. ? Guided wire bougies. This involves using an endoscope to insert a wire into the esophagus. A dilator is passed over this wire to enlarge the esophagus. Then the wire is removed. ? Balloon dilators. An endoscope with a small balloon is inserted into the esophagus. The balloon is inflated to stretch the esophagus and open it up. The procedure may vary among health care providers and hospitals. What can I expect after the procedure?  Your blood pressure, heart rate, breathing rate, and blood oxygen level will be monitored until you leave the hospital or clinic.  Your throat may feel slightly sore and numb. This will get better over time.  You will not be allowed to eat or drink until your throat is no longer  numb.  When you are able to drink, urinate, and sit on the edge of the bed without nausea or dizziness, you may be able to return home. Follow these instructions at home:  Take over-the-counter and prescription medicines only as told by your health care provider.  If you were given a sedative during the procedure, it can affect you for several hours. Do not drive or operate machinery until your health care provider says that it is safe.  Plan to have a responsible adult care for you for the time you are told. This is important.  Follow instructions from your health care provider about any eating or drinking restrictions.  Do not use any products that contain nicotine or tobacco, such as cigarettes, e-cigarettes, and chewing tobacco. If you need help quitting, ask your health care provider.  Keep all follow-up visits. This is important. Contact a health care  provider if:  You have a fever.  You have pain that is not relieved by medicine. Get help right away if:  You have chest pain.  You have trouble breathing.  You have trouble swallowing.  You vomit blood.  You have black, tarry, or bloody stools. These symptoms may represent a serious problem that is an emergency. Do not wait to see if the symptoms will go away. Get medical help right away. Call your local emergency services (911 in the [place] Do not drive yourself to the hospital. Summary  Esophageal dilatation, also called esophageal dilation, is a procedure to widen or open a blocked or narrowed part of the esophagus.  Plan to have a responsible adult take you home from the hospital or clinic.  For this procedure, a numbing medicine may be sprayed into the back of your throat, or you may gargle the medicine.  Do not drive or operate machinery until your health care provider says that it is safe. This information is not intended to replace advice given to you by your health care provider. Make sure you discuss any questions you have with your health care provider. Document Revised: 07/19/2019 Document Reviewed: 07/19/2019 Elsevier Patient Education  2021 [place] After This sheet gives you information about how to care for yourself after your procedure. Your health care provider may also give you more specific instructions. If you have problems or questions, contact your health care provider. What can I expect after the procedure? After the procedure, it is common to have:  Tiredness.  Forgetfulness about what happened after the procedure.  Impaired judgment for important decisions.  Nausea or vomiting.  Some difficulty with balance. Follow these instructions at home: For the time period you were told by your health care provider:  Rest as needed.  Do not participate in activities where you could fall or become injured.  Do not  drive or use machinery.  Do not drink alcohol.  Do not take sleeping pills or medicines that cause drowsiness.  Do not make important decisions or sign legal documents.  Do not take care of children on your own.      Eating and drinking  Follow the diet that is recommended by your health care provider.  Drink enough fluid to keep your urine pale yellow.  If you vomit: ? Drink water, juice, or soup when you can drink without vomiting. ? Make sure you have little or no nausea before eating solid foods. General instructions  Have a responsible adult stay with you for the time you are told. It  is important to have someone help care for you until you are awake and alert.  Take over-the-counter and prescription medicines only as told by your health care provider.  If you have sleep apnea, surgery and certain medicines can increase your risk for breathing problems. Follow instructions from your health care provider about wearing your sleep device: ? Anytime you are sleeping, including during daytime naps. ? While taking prescription pain medicines, sleeping medicines, or medicines that make you drowsy.  Avoid smoking.  Keep all follow-up visits as told by your health care provider. This is important. Contact a health care provider if:  You keep feeling nauseous or you keep vomiting.  You feel light-headed.  You are still sleepy or having trouble with balance after 24 hours.  You develop a rash.  You have a fever.  You have redness or swelling around the IV site. Get help right away if:  You have trouble breathing.  You have new-onset confusion at home. Summary  For several hours after your procedure, you may feel tired. You may also be forgetful and have poor judgment.  Have a responsible adult stay with you for the time you are told. It is important to have someone help care for you until you are awake and alert.  Rest as told. Do not drive or operate machinery. Do  not drink alcohol or take sleeping pills.  Get help right away if you have trouble breathing, or if you suddenly become confused. This information is not intended to replace advice given to you by your health care provider. Make sure you discuss any questions you have with your health care provider. Document Revised: 11/16/2019 Document Reviewed: 02/02/2019 Elsevier Patient Education  2021 [employer].

## 2020-03-27 ENCOUNTER — Encounter (HOSPITAL_COMMUNITY): Payer: Self-pay

## 2020-03-27 ENCOUNTER — Other Ambulatory Visit (HOSPITAL_COMMUNITY)
Admission: RE | Admit: 2020-03-27 | Discharge: 2020-03-27 | Disposition: A | Discharge status: Home / Self Care | Payer: Medicare Other | Source: Ambulatory Visit | Attending: Gastroenterology | Admitting: Gastroenterology

## 2020-03-27 ENCOUNTER — Other Ambulatory Visit: Payer: Self-pay

## 2020-03-27 ENCOUNTER — Encounter (HOSPITAL_COMMUNITY)
Admission: RE | Admit: 2020-03-27 | Discharge: 2020-03-27 | Disposition: A | Discharge status: Home / Self Care | Payer: Medicare Other | Source: Ambulatory Visit | Attending: Gastroenterology | Admitting: Gastroenterology

## 2020-03-27 DIAGNOSIS — Z01818 Encounter for other preprocedural examination: Secondary | ICD-10-CM | POA: Insufficient documentation

## 2020-03-27 DIAGNOSIS — Z20822 Contact with and (suspected) exposure to covid-19: Secondary | ICD-10-CM | POA: Diagnosis not present

## 2020-03-27 LAB — SARS CORONAVIRUS 2 (TAT 6-24 HRS): SARS Coronavirus 2: NEGATIVE

## 2020-03-29 ENCOUNTER — Ambulatory Visit (HOSPITAL_COMMUNITY): Payer: Medicare Other | Admitting: Anesthesiology

## 2020-03-29 ENCOUNTER — Encounter (HOSPITAL_COMMUNITY): Payer: Self-pay | Admitting: Gastroenterology

## 2020-03-29 ENCOUNTER — Encounter (HOSPITAL_COMMUNITY)
Admission: RE | Disposition: A | Discharge status: Home / Self Care | Payer: Self-pay | Source: Home / Self Care | Attending: Gastroenterology

## 2020-03-29 ENCOUNTER — Ambulatory Visit (HOSPITAL_COMMUNITY)
Admission: RE | Admit: 2020-03-29 | Discharge: 2020-03-29 | Disposition: A | Discharge status: Home / Self Care | Payer: Medicare Other | Attending: Gastroenterology | Admitting: Gastroenterology

## 2020-03-29 DIAGNOSIS — I10 Essential (primary) hypertension: Secondary | ICD-10-CM | POA: Insufficient documentation

## 2020-03-29 DIAGNOSIS — Z885 Allergy status to narcotic agent status: Secondary | ICD-10-CM | POA: Insufficient documentation

## 2020-03-29 DIAGNOSIS — F17210 Nicotine dependence, cigarettes, uncomplicated: Secondary | ICD-10-CM | POA: Insufficient documentation

## 2020-03-29 DIAGNOSIS — Z79890 Hormone replacement therapy (postmenopausal): Secondary | ICD-10-CM | POA: Diagnosis not present

## 2020-03-29 DIAGNOSIS — J449 Chronic obstructive pulmonary disease, unspecified: Secondary | ICD-10-CM | POA: Diagnosis not present

## 2020-03-29 DIAGNOSIS — K222 Esophageal obstruction: Secondary | ICD-10-CM | POA: Diagnosis not present

## 2020-03-29 DIAGNOSIS — K2950 Unspecified chronic gastritis without bleeding: Secondary | ICD-10-CM | POA: Diagnosis not present

## 2020-03-29 DIAGNOSIS — K3189 Other diseases of stomach and duodenum: Secondary | ICD-10-CM

## 2020-03-29 DIAGNOSIS — Z79899 Other long term (current) drug therapy: Secondary | ICD-10-CM | POA: Diagnosis not present

## 2020-03-29 DIAGNOSIS — R1310 Dysphagia, unspecified: Secondary | ICD-10-CM

## 2020-03-29 DIAGNOSIS — B9681 Helicobacter pylori [H. pylori] as the cause of diseases classified elsewhere: Secondary | ICD-10-CM | POA: Diagnosis not present

## 2020-03-29 DIAGNOSIS — Z888 Allergy status to other drugs, medicaments and biological substances status: Secondary | ICD-10-CM | POA: Insufficient documentation

## 2020-03-29 DIAGNOSIS — K31A19 Gastric intestinal metaplasia without dysplasia, unspecified site: Secondary | ICD-10-CM | POA: Diagnosis not present

## 2020-03-29 DIAGNOSIS — K449 Diaphragmatic hernia without obstruction or gangrene: Secondary | ICD-10-CM

## 2020-03-29 HISTORY — PX: ESOPHAGOGASTRODUODENOSCOPY (EGD) WITH PROPOFOL: SHX5813

## 2020-03-29 HISTORY — PX: BIOPSY: SHX5522

## 2020-03-29 HISTORY — PX: ESOPHAGEAL DILATION: SHX303

## 2020-03-29 SURGERY — ESOPHAGOGASTRODUODENOSCOPY (EGD) WITH PROPOFOL
Anesthesia: General

## 2020-03-29 MED ORDER — PROPOFOL 10 MG/ML IV BOLUS
INTRAVENOUS | Status: AC
  Filled 2020-03-29: qty 80

## 2020-03-29 MED ORDER — PROPOFOL 10 MG/ML IV BOLUS
INTRAVENOUS | Status: DC | PRN
  Administered 2020-03-29: 50 mg via INTRAVENOUS

## 2020-03-29 MED ORDER — LACTATED RINGERS IV SOLN: INTRAVENOUS | Status: DC | PRN

## 2020-03-29 MED ORDER — LIDOCAINE VISCOUS HCL 2 % MT SOLN
OROMUCOSAL | Status: AC
  Filled 2020-03-29: qty 15

## 2020-03-29 MED ORDER — GLYCOPYRROLATE 0.2 MG/ML IJ SOLN
INTRAMUSCULAR | Status: AC
  Filled 2020-03-29: qty 1

## 2020-03-29 MED ORDER — GLYCOPYRROLATE 0.2 MG/ML IJ SOLN
0.2000 mg | Freq: Once | INTRAMUSCULAR | Status: AC
  Administered 2020-03-29: 0.2 mg via INTRAVENOUS

## 2020-03-29 MED ORDER — LIDOCAINE VISCOUS HCL 2 % MT SOLN
15.0000 mL | Freq: Once | OROMUCOSAL | Status: AC
  Administered 2020-03-29: 15 mL via OROMUCOSAL

## 2020-03-29 MED ORDER — STERILE WATER FOR IRRIGATION IR SOLN
Status: DC | PRN
  Administered 2020-03-29: 100 mL

## 2020-03-29 MED ORDER — PROPOFOL 500 MG/50ML IV EMUL
INTRAVENOUS | Status: DC | PRN
  Administered 2020-03-29: 150 ug/kg/min via INTRAVENOUS

## 2020-03-29 MED ORDER — OMEPRAZOLE 40 MG PO CPDR: DELAYED_RELEASE_CAPSULE | ORAL | 3 refills | Status: DC

## 2020-03-29 MED ORDER — LACTATED RINGERS IV SOLN: Freq: Once | INTRAVENOUS | Status: AC

## 2020-03-29 NOTE — H&P (Signed)
Alexis Ortega is an 75 y.o. female.   Chief Complaint: Dysphagia HPI: 75 y.o. female with PMH depression, FBM, hypothyroidism, HTN, multiple back surgeries, and history of Schatzki's ring, who presents for management of dysphagia.  The patient has presented recurrent episodes of dysphagia for the last 2 years, which is worse with solids.  She states that the symptoms have not improved despite taking PPI.  Notably, she lost some weight initially in the 1st weeks but her weight has been stable since then.  Denies any odynophagia or heartburn frequently.  She has been eating mostly Ensure and small amount of solid food.  Last EGD: 2012--Schatzki's ring, dilated 16.5 mm, mild esophagitis.  Past Medical History:  Diagnosis Date  . Arthritis   . Asthma   . Depression   . Fibromyalgia   . GERD (gastroesophageal reflux disease)   . Hypertension   . Hypothyroidism   . Thyroid disease     Past Surgical History:  Procedure Laterality Date  . ABDOMINAL HYSTERECTOMY    . BACK SURGERY    . CARPAL TUNNEL RELEASE Right   . CHOLECYSTECTOMY    . EYE SURGERY     cataract with lens implant  . KNEE ARTHROSCOPY Left   . LEFT HEART CATHETERIZATION WITH CORONARY ANGIOGRAM N/A 10/02/2013   Procedure: LEFT HEART CATHETERIZATION WITH CORONARY ANGIOGRAM;  Surgeon: Lorretta Harp, MD;  Location: [facility] CATH LAB;  Service: Cardiovascular;  Laterality: N/A;  . NECK SURGERY    . TOTAL KNEE ARTHROPLASTY Left 06/01/2012   Procedure: LEFT TOTAL KNEE ARTHROPLASTY;  Surgeon: Ninetta Lights, MD;  Location: [facility];  Service: Orthopedics;  Laterality: Left;  . TUBAL LIGATION      Family History  Problem Relation Age of Onset  . Allergies Son   . Asthma Daughter    Social History:  reports that she has been smoking cigarettes. She has a 20.00 pack-year smoking history. She has never used smokeless tobacco. She reports current drug use. She reports that she does not drink alcohol.  Allergies:  Allergies  Allergen  Reactions  . Diclofenac Rash  . Tramadol Itching    Medications Prior to Admission  Medication Sig Dispense Refill  . acetaminophen (TYLENOL) 500 MG tablet Take 500 mg by mouth every 6 (six) hours as needed for moderate pain or mild pain.    Marland Kitchen ALPRAZolam (XANAX) 0.5 MG tablet Take 0.5 mg by mouth at bedtime.    Marland Kitchen amLODipine (NORVASC) 10 MG tablet Take 10 mg by mouth daily.    . citalopram (CELEXA) 20 MG tablet Take 20 mg by mouth daily.    . fentaNYL (DURAGESIC) 25 MCG/HR Place 1 patch onto the skin every 3 (three) days.    Marland Kitchen gabapentin (NEURONTIN) 300 MG capsule Take 300 mg by mouth 2 (two) times daily as needed (Pain).    Marland Kitchen levothyroxine (SYNTHROID) 100 MCG tablet Take 100 mcg by mouth daily before breakfast.     . losartan (COZAAR) 100 MG tablet Take 100 mg by mouth daily.    . Multiple Vitamin (MULTIVITAMIN WITH MINERALS) TABS tablet Take 1 tablet by mouth daily.    Marland Kitchen PROAIR HFA 108 (90 BASE) MCG/ACT inhaler Inhale 2 puffs into the lungs every 4 (four) hours as needed for wheezing.     . Zinc 50 MG CAPS Take 50 mg by mouth daily.    . ondansetron (ZOFRAN ODT) 4 MG disintegrating tablet 4mg  ODT q4 hours prn nausea/vomit (Patient taking differently: Take 4 mg by  mouth every 4 (four) hours as needed for nausea or vomiting. 4mg  ODT q4 hours prn nausea/vomit) 12 tablet 0    Results for orders placed or performed during the hospital encounter of 03/27/20 (from the past 48 hour(s))  SARS CORONAVIRUS 2 (TAT 6-24 HRS) Nasopharyngeal Nasopharyngeal Swab     Status: None   Collection Time: 03/27/20  9:28 AM   Specimen: Nasopharyngeal Swab  Result Value Ref Range   SARS Coronavirus 2 NEGATIVE NEGATIVE    Comment: (NOTE) SARS-CoV-2 target nucleic acids are NOT DETECTED.  The SARS-CoV-2 RNA is generally detectable in upper and lower respiratory specimens during the acute phase of infection. Negative results do not preclude SARS-CoV-2 infection, do not rule out co-infections with other  pathogens, and should not be used as the sole basis for treatment or other patient management decisions. Negative results must be combined with clinical observations, patient history, and epidemiological information. The expected result is Negative.  Fact Sheet for Patients: SugarRoll.be  Fact Sheet for Healthcare Providers: https://www.woods-mathews.com/  This test is not yet approved or cleared by the [place] FDA and  has been authorized for detection and/or diagnosis of SARS-CoV-2 by FDA under an Emergency Use Authorization (EUA). This EUA will remain  in effect (meaning this test can be used) for the duration of the COVID-19 declaration under Se ction 564(b)(1) of the Act, 21 U.S.C. section 360bbb-3(b)(1), unless the authorization is terminated or revoked sooner.  Performed at [facility], [place] [address], [place], [place] [address]    No results found.  Review of Systems  Constitutional: Negative.   HENT: Positive for trouble swallowing.   Eyes: Negative.   Respiratory: Negative.   Cardiovascular: Negative.   Gastrointestinal: Negative.   Endocrine: Negative.   Genitourinary: Negative.   Musculoskeletal: Negative.   Skin: Negative.   Allergic/Immunologic: Negative.   Neurological: Negative.   Hematological: Negative.   Psychiatric/Behavioral: Negative.     Blood pressure (!) 147/69, pulse 65, temperature 98 F (36.7 C), temperature source Oral, resp. rate 16, height 5\' 4"  (1.626 m), weight 72.6 kg, SpO2 98 %. Physical Exam  GENERAL: The patient is AO x3, in no acute distress. HEENT: Head is normocephalic and atraumatic. EOMI are intact. Mouth is well hydrated and without lesions. NECK: Supple. No masses LUNGS: Clear to auscultation. No presence of rhonchi/wheezing/rales. Adequate chest expansion HEART: RRR, normal s1 and s2. ABDOMEN: Soft, nontender, no guarding, no peritoneal signs, and nondistended. BS +. No  masses. EXTREMITIES: Without any cyanosis, clubbing, rash, lesions or edema. NEUROLOGIC: AOx3, no focal motor deficit. SKIN: no jaundice, no rashes  Assessment/Plan 75 y.o. female with PMH depression, FBM, hypothyroidism, HTN, multiple back surgeries, and history of Schatzki's ring, who presents for management of dysphagia.  We will proceed with EGD with esophageal dilation.  Harvel Quale, MD 03/29/2020, 8:21 AM

## 2020-03-29 NOTE — Anesthesia Preprocedure Evaluation (Signed)
Anesthesia Evaluation  Patient identified by MRN, date of birth, ID band Patient awake    Reviewed: Allergy & Precautions, NPO status , Patient's Chart, lab work & pertinent test results  History of Anesthesia Complications Negative for: history of anesthetic complications  Airway Mallampati: II  TM Distance: >3 FB Neck ROM: Full    Dental  (+) Dental Advisory Given, Edentulous Upper, Missing   Pulmonary asthma , COPD,  COPD inhaler, Current Smoker and Patient abstained from smoking.,    Pulmonary exam normal breath sounds clear to auscultation       Cardiovascular Exercise Tolerance: Poor METS (chronic back pain): hypertension, Pt. on medications Normal cardiovascular exam Rhythm:Regular Rate:Normal     Neuro/Psych PSYCHIATRIC DISORDERS Depression  Neuromuscular disease    GI/Hepatic Neg liver ROS, GERD  Medicated,  Endo/Other  Hypothyroidism   Renal/GU negative Renal ROS     Musculoskeletal  (+) Arthritis , Fibromyalgia -, narcotic dependent  Abdominal   Peds  Hematology  (+) anemia ,   Anesthesia Other Findings On fentanyl patch  Reproductive/Obstetrics                             Anesthesia Physical Anesthesia Plan  ASA: III  Anesthesia Plan: General   Post-op Pain Management:    Induction: Intravenous  PONV Risk Score and Plan: TIVA  Airway Management Planned: Nasal Cannula and Natural Airway  Additional Equipment:   Intra-op Plan:   Post-operative Plan:   Informed Consent: I have reviewed the patients History and Physical, chart, labs and discussed the procedure including the risks, benefits and alternatives for the proposed anesthesia with the patient or authorized representative who has indicated his/her understanding and acceptance.     Dental advisory given  Plan Discussed with: CRNA and Surgeon  Anesthesia Plan Comments:         Anesthesia Quick  Evaluation

## 2020-03-29 NOTE — Anesthesia Postprocedure Evaluation (Signed)
Anesthesia Post Note  Patient: Lorette A Landgren  Procedure(s) Performed: ESOPHAGOGASTRODUODENOSCOPY (EGD) WITH PROPOFOL (N/A ) ESOPHAGEAL DILATION (N/A ) BIOPSY  Patient location during evaluation: PACU Anesthesia Type: General Level of consciousness: awake, oriented, awake and alert and patient cooperative Pain management: satisfactory to patient Vital Signs Assessment: post-procedure vital signs reviewed and stable Respiratory status: spontaneous breathing, respiratory function stable and nonlabored ventilation Cardiovascular status: stable Postop Assessment: no apparent nausea or vomiting Anesthetic complications: no   No complications documented.   Last Vitals:  Vitals:   03/29/20 0759  BP: (!) 147/69  Pulse: 65  Resp: 16  Temp: 36.7 C  SpO2: 98%    Last Pain:  Vitals:   03/29/20 0922  TempSrc:   PainSc: 5                  Leshawn Houseworth

## 2020-03-29 NOTE — Op Note (Signed)
[facility] Patient Name: Alexis Ortega Procedure Date: 03/29/2020 9:16 AM MRN: [id number] Date of Birth: 1945-07-14 Attending MD: Maylon Peppers ,  CSN: [id number] Age: 75 Admit Type: Outpatient Procedure:                Upper GI endoscopy Indications:              Dysphagia Providers:                Maylon Peppers, Crystal Page, Raphael Gibney,                            Technician Referring MD:              Medicines:                Monitored Anesthesia Care Complications:            No immediate complications. Estimated Blood Loss:     Estimated blood loss: none. Procedure:                Pre-Anesthesia Assessment:                           - Prior to the procedure, a History and Physical                            was performed, and patient medications, allergies                            and sensitivities were reviewed. The patient's                            tolerance of previous anesthesia was reviewed.                           - The risks and benefits of the procedure and the                            sedation options and risks were discussed with the                            patient. All questions were answered and informed                            consent was obtained.                           - ASA Grade Assessment: II - A patient with mild                            systemic disease.                           After obtaining informed consent, the endoscope was                            passed under direct vision. Throughout the  procedure, the patient's blood pressure, pulse, and                            oxygen saturations were monitored continuously. The                            [id number]-H190 ([id number]) scope was introduced through the                            mouth, and advanced to the second part of duodenum.                            The upper GI endoscopy was accomplished without                            difficulty. The  patient tolerated the procedure                            well. Scope In: 9:27:56 AM Scope Out: 9:44:27 AM Total Procedure Duration: 0 hours 16 minutes 31 seconds  Findings:      A non-obstructing Schatzki ring was found at the gastroesophageal       junction. A guidewire was placed and the scope was withdrawn. Dilation       was performed with a Savary dilator with mild resistance at 18 mm.       Mucosal disruption was noted upon reinspection.      A 1 cm hiatal hernia was present.      Localized nodular mucosa was found in the gastric body. Biopsies were       taken with a cold forceps for histology.      The examined duodenum was normal. Impression:               - Non-obstructing Schatzki ring. Dilated.                           - 1 cm hiatal hernia.                           - Nodular mucosa in the gastric body. Biopsied.                           - Normal examined duodenum. Moderate Sedation:      Per Anesthesia Care Recommendation:           - Discharge patient to home (ambulatory).                           - Resume previous diet, advance as tolerated.                           - Use Prilosec (omeprazole) 40 mg PO BID for 2                            months, then decrease to once a day indefinitely.                           -  Await pathology results. Procedure Code(s):        --- Professional ---                           [phone], Esophagogastroduodenoscopy, flexible,                            transoral; with insertion of guide wire followed by                            passage of dilator(s) through esophagus over guide                            wire                           43239, 11, Esophagogastroduodenoscopy, flexible,                            transoral; with biopsy, single or multiple Diagnosis Code(s):        --- Professional ---                           K22.2, Esophageal obstruction                           K44.9, Diaphragmatic hernia without obstruction or                             gangrene                           K31.89, Other diseases of stomach and duodenum                           R13.10, Dysphagia, unspecified CPT copyright 2019 American Medical Association. All rights reserved. The codes documented in this report are preliminary and upon coder review may  be revised to meet current compliance requirements. Maylon Peppers, MD Maylon Peppers,  03/29/2020 9:51:59 AM This report has been signed electronically. Number of Addenda: 0

## 2020-03-29 NOTE — Discharge Instructions (Signed)
You are being discharged to home.  Resume your previous diet, advance as tolerated.  Take Prilosec (omeprazole) 40 mg by mouth twice a day for two months, then decrease to once a day indefinitely.  We are waiting for your pathology results.       Upper Endoscopy, Adult, Care After This sheet gives you information about how to care for yourself after your procedure. Your health care provider may also give you more specific instructions. If you have problems or questions, contact your health care provider. What can I expect after the procedure? After the procedure, it is common to have:  A sore throat.  Mild stomach pain or discomfort.  Bloating.  Nausea. Follow these instructions at home:  Follow instructions from your health care provider about what to eat or drink after your procedure.  Return to your normal activities as told by your health care provider. Ask your health care provider what activities are safe for you.  Take over-the-counter and prescription medicines only as told by your health care provider.  If you were given a sedative during the procedure, it can affect you for several hours. Do not drive or operate machinery until your health care provider says that it is safe.  Keep all follow-up visits as told by your health care provider. This is important.   Contact a health care provider if you have:  A sore throat that lasts longer than one day.  Trouble swallowing. Get help right away if:  You vomit blood or your vomit looks like coffee grounds.  You have: ? A fever. ? Bloody, black, or tarry stools. ? A severe sore throat or you cannot swallow. ? Difficulty breathing. ? Severe pain in your chest or abdomen. Summary  After the procedure, it is common to have a sore throat, mild stomach discomfort, bloating, and nausea.  If you were given a sedative during the procedure, it can affect you for several hours. Do not drive or operate machinery until your  health care provider says that it is safe.  Follow instructions from your health care provider about what to eat or drink after your procedure.  Return to your normal activities as told by your health care provider. This information is not intended to replace advice given to you by your health care provider. Make sure you discuss any questions you have with your health care provider. Document Revised: 02/28/2019 Document Reviewed: 08/02/2017 Elsevier Patient Education  2021 [employer]      Monitored Anesthesia Care, Care After This sheet gives you information about how to care for yourself after your procedure. Your health care provider may also give you more specific instructions. If you have problems or questions, contact your health care provider. What can I expect after the procedure? After the procedure, it is common to have:  Tiredness.  Forgetfulness about what happened after the procedure.  Impaired judgment for important decisions.  Nausea or vomiting.  Some difficulty with balance. Follow these instructions at home: For the time period you were told by your health care provider:  Rest as needed.  Do not participate in activities where you could fall or become injured.  Do not drive or use machinery.  Do not drink alcohol.  Do not take sleeping pills or medicines that cause drowsiness.  Do not make important decisions or sign legal documents.  Do not take care of children on your own.      Eating and drinking  Follow the diet that is  recommended by your health care provider.  Drink enough fluid to keep your urine pale yellow.  If you vomit: ? Drink water, juice, or soup when you can drink without vomiting. ? Make sure you have little or no nausea before eating solid foods. General instructions  Have a responsible adult stay with you for the time you are told. It is important to have someone help care for you until you are awake and alert.  Take  over-the-counter and prescription medicines only as told by your health care provider.  If you have sleep apnea, surgery and certain medicines can increase your risk for breathing problems. Follow instructions from your health care provider about wearing your sleep device: ? Anytime you are sleeping, including during daytime naps. ? While taking prescription pain medicines, sleeping medicines, or medicines that make you drowsy.  Avoid smoking.  Keep all follow-up visits as told by your health care provider. This is important. Contact a health care provider if:  You keep feeling nauseous or you keep vomiting.  You feel light-headed.  You are still sleepy or having trouble with balance after 24 hours.  You develop a rash.  You have a fever.  You have redness or swelling around the IV site. Get help right away if:  You have trouble breathing.  You have new-onset confusion at home. Summary  For several hours after your procedure, you may feel tired. You may also be forgetful and have poor judgment.  Have a responsible adult stay with you for the time you are told. It is important to have someone help care for you until you are awake and alert.  Rest as told. Do not drive or operate machinery. Do not drink alcohol or take sleeping pills.  Get help right away if you have trouble breathing, or if you suddenly become confused. This information is not intended to replace advice given to you by your health care provider. Make sure you discuss any questions you have with your health care provider. Document Revised: 11/16/2019 Document Reviewed: 02/02/2019 Elsevier Patient Education  2021 [place].    Omeprazole Sprinkle Capsules What is this medicine? OMEPRAZOLE (oh ME pray zol) prevents the production of acid in the stomach. It is used to treat gastroesophageal reflux disease (GERD), ulcers, certain bacteria in the stomach, inflammation of the esophagus, and Zollinger-Ellison  Syndrome. It is also used to treat other conditions that cause too much stomach acid. This medicine may be used for other purposes; ask your health care provider or pharmacist if you have questions. COMMON BRAND NAME(S): Prilosec What should I tell my health care provider before I take this medicine? They need to know if you have any of these conditions:  liver disease  low levels of magnesium in the blood  lupus  an unusual or allergic reaction to omeprazole, other medicines, foods, dyes, or preservatives  pregnant or trying to get pregnant  breast-feeding How should I use this medicine? Take this medicine by mouth with a glass of water. Follow the directions on the prescription label. Do not cut, crush or chew this medicine. Swallow the capsules whole. You may open the capsule and put the contents in 1 tablespoon of applesauce. Swallow the medicine and applesauce right away. Do not chew the medicine or applesauce. Take this medicine before a meal. Take your medicine at regular intervals. Do not take your medicine more often than directed. Do not stop taking except on your doctor's advice. A special MedGuide will be given  to you by the pharmacist with each prescription and refill. Be sure to read this information carefully each time. Talk to your pediatrician regarding the use of this medicine in children. While this drug may be prescribed for children as young as 2 years for selected conditions, precautions do apply. Overdosage: If you think you have taken too much of this medicine contact a poison control center or emergency room at once. NOTE: This medicine is only for you. Do not share this medicine with others. What if I miss a dose? If you miss a dose, take it as soon as you can. If it is almost time for your next dose, take only that dose. Do not take double or extra doses. What may interact with this medicine? Do not take this medicine with any of the following  medications:  atazanavir  clopidogrel  nelfinavir  rilpivirine This medicine may also interact with the following medications:  antifungals like itraconazole, ketoconazole, and voriconazole  certain antivirals for HIV or hepatitis  certain medicines that treat or prevent blood clots like warfarin  cilostazol  citalopram  cyclosporine  dasatinib  digoxin  disulfiram  diuretics  erlotinib  iron supplements  medicines for anxiety, panic, and sleep like diazepam  medicines for seizures like carbamazepine, phenobarbital, phenytoin  methotrexate  mycophenolate mofetil  nilotinib  rifampin  St. John's wort  tacrolimus  vitamin B12 This list may not describe all possible interactions. Give your health care provider a list of all the medicines, herbs, non-prescription drugs, or dietary supplements you use. Also tell them if you smoke, drink alcohol, or use illegal drugs. Some items may interact with your medicine. What should I watch for while using this medicine? Visit your healthcare professional for regular checks on your progress. Tell your healthcare professional if your symptoms do not start to get better or if they get worse. You may need blood work done while taking this medicine. This medicine may cause a decrease in vitamin B12. You should make sure that you get enough vitamin B12 while you are taking this medicine. Discuss the foods you eat and the vitamins you take with your health care professional. What side effects may I notice from receiving this medicine? Side effects that you should report to your doctor or health care professional as soon as possible:  allergic reactions like skin rash, itching or hives, swelling of the face, lips, or tongue  bone pain  breathing problems  fever or sore throat  joint pain  rash on cheeks or arms that gets worse in the sun  redness, blistering, peeling, or loosening of the skin, including inside the  mouth  severe diarrhea  signs and symptoms of kidney injury like trouble passing urine or change in the amount of urine  signs and symptoms of low magnesium like muscle cramps; muscle pain; muscle weakness; tremors; seizures; or fast, irregular heartbeat  stomach polyps  unusual bleeding or bruising Side effects that usually do not require medical attention (report to your doctor or health care professional if they continue or are bothersome):  diarrhea  dry mouth  gas  headache  nausea  stomach pain This list may not describe all possible side effects. Call your doctor for medical advice about side effects. You may report side effects to FDA at 1-800-FDA-1088. Where should I keep my medicine? Keep out of the reach of children. Store at room temperature between 15 and 30 degrees C (59 and 86 degrees F). Protect from light and moisture.  Throw away any unused medicine after the expiration date. NOTE: This sheet is a summary. It may not cover all possible information. If you have questions about this medicine, talk to your doctor, pharmacist, or health care provider.  2021 Elsevier/[employer] Standard (2020-01-10 18:32:47)

## 2020-03-29 NOTE — Transfer of Care (Signed)
Immediate Anesthesia Transfer of Care Note  Patient: Alexis Ortega  Procedure(s) Performed: ESOPHAGOGASTRODUODENOSCOPY (EGD) WITH PROPOFOL (N/A ) ESOPHAGEAL DILATION (N/A ) BIOPSY  Patient Location: PACU  Anesthesia Type:General  Level of Consciousness: awake, alert , oriented and patient cooperative  Airway & Oxygen Therapy: Patient Spontanous Breathing  Post-op Assessment: Report given to RN, Post -op Vital signs reviewed and stable and Patient moving all extremities X 4  Post vital signs: Reviewed and stable  Last Vitals:  Vitals Value Taken Time  BP    Temp    Pulse    Resp    SpO2      Last Pain:  Vitals:   03/29/20 0922  TempSrc:   PainSc: 5       Patients Stated Pain Goal: 6 (16/10/96 0454)  Complications: No complications documented.

## 2020-03-29 NOTE — Addendum Note (Signed)
Addendum  created 03/29/20 1245 by Jonna Munro, CRNA   Charge Capture section accepted

## 2020-04-01 ENCOUNTER — Other Ambulatory Visit: Payer: Self-pay

## 2020-04-02 ENCOUNTER — Other Ambulatory Visit (INDEPENDENT_AMBULATORY_CARE_PROVIDER_SITE_OTHER): Payer: Self-pay | Admitting: Gastroenterology

## 2020-04-02 DIAGNOSIS — B9681 Helicobacter pylori [H. pylori] as the cause of diseases classified elsewhere: Secondary | ICD-10-CM

## 2020-04-02 LAB — SURGICAL PATHOLOGY

## 2020-04-02 MED ORDER — AMOXICILLIN 500 MG PO CAPS: 1000.0000 mg | ORAL_CAPSULE | Freq: Two times a day (BID) | ORAL | 0 refills | Status: AC

## 2020-04-02 MED ORDER — CLARITHROMYCIN 500 MG PO TABS: 500.0000 mg | ORAL_TABLET | Freq: Two times a day (BID) | ORAL | 0 refills | Status: AC

## 2020-04-04 ENCOUNTER — Encounter (HOSPITAL_COMMUNITY): Payer: Self-pay | Admitting: Gastroenterology

## 2020-04-17 DIAGNOSIS — M5416 Radiculopathy, lumbar region: Secondary | ICD-10-CM | POA: Diagnosis not present

## 2020-04-17 DIAGNOSIS — M5414 Radiculopathy, thoracic region: Secondary | ICD-10-CM | POA: Diagnosis not present

## 2020-04-30 DIAGNOSIS — M5124 Other intervertebral disc displacement, thoracic region: Secondary | ICD-10-CM | POA: Diagnosis not present

## 2020-04-30 DIAGNOSIS — R202 Paresthesia of skin: Secondary | ICD-10-CM | POA: Diagnosis not present

## 2020-04-30 DIAGNOSIS — M47816 Spondylosis without myelopathy or radiculopathy, lumbar region: Secondary | ICD-10-CM | POA: Diagnosis not present

## 2020-04-30 DIAGNOSIS — N281 Cyst of kidney, acquired: Secondary | ICD-10-CM | POA: Diagnosis not present

## 2020-04-30 DIAGNOSIS — M5416 Radiculopathy, lumbar region: Secondary | ICD-10-CM | POA: Diagnosis not present

## 2020-04-30 DIAGNOSIS — M5125 Other intervertebral disc displacement, thoracolumbar region: Secondary | ICD-10-CM | POA: Diagnosis not present

## 2020-04-30 DIAGNOSIS — M5126 Other intervertebral disc displacement, lumbar region: Secondary | ICD-10-CM | POA: Diagnosis not present

## 2020-04-30 DIAGNOSIS — M5414 Radiculopathy, thoracic region: Secondary | ICD-10-CM | POA: Diagnosis not present

## 2020-04-30 DIAGNOSIS — M48061 Spinal stenosis, lumbar region without neurogenic claudication: Secondary | ICD-10-CM | POA: Diagnosis not present

## 2020-04-30 DIAGNOSIS — M47814 Spondylosis without myelopathy or radiculopathy, thoracic region: Secondary | ICD-10-CM | POA: Diagnosis not present

## 2020-04-30 DIAGNOSIS — M25551 Pain in right hip: Secondary | ICD-10-CM | POA: Diagnosis not present

## 2020-04-30 DIAGNOSIS — M25552 Pain in left hip: Secondary | ICD-10-CM | POA: Diagnosis not present

## 2020-05-07 DIAGNOSIS — E032 Hypothyroidism due to medicaments and other exogenous substances: Secondary | ICD-10-CM | POA: Diagnosis not present

## 2020-05-07 DIAGNOSIS — G458 Other transient cerebral ischemic attacks and related syndromes: Secondary | ICD-10-CM | POA: Diagnosis not present

## 2020-05-07 DIAGNOSIS — J440 Chronic obstructive pulmonary disease with acute lower respiratory infection: Secondary | ICD-10-CM | POA: Diagnosis not present

## 2020-05-07 DIAGNOSIS — M545 Low back pain, unspecified: Secondary | ICD-10-CM | POA: Diagnosis not present

## 2020-05-07 DIAGNOSIS — F411 Generalized anxiety disorder: Secondary | ICD-10-CM | POA: Diagnosis not present

## 2020-05-15 DIAGNOSIS — M5416 Radiculopathy, lumbar region: Secondary | ICD-10-CM | POA: Diagnosis not present

## 2020-05-30 DIAGNOSIS — Z1231 Encounter for screening mammogram for malignant neoplasm of breast: Secondary | ICD-10-CM | POA: Diagnosis not present

## 2020-06-03 DIAGNOSIS — G458 Other transient cerebral ischemic attacks and related syndromes: Secondary | ICD-10-CM | POA: Diagnosis not present

## 2020-06-03 DIAGNOSIS — I6523 Occlusion and stenosis of bilateral carotid arteries: Secondary | ICD-10-CM | POA: Diagnosis not present

## 2020-06-11 DIAGNOSIS — J441 Chronic obstructive pulmonary disease with (acute) exacerbation: Secondary | ICD-10-CM | POA: Diagnosis not present

## 2020-06-11 DIAGNOSIS — T886XXA Anaphylactic reaction due to adverse effect of correct drug or medicament properly administered, initial encounter: Secondary | ICD-10-CM | POA: Diagnosis not present

## 2020-06-13 DIAGNOSIS — M48061 Spinal stenosis, lumbar region without neurogenic claudication: Secondary | ICD-10-CM | POA: Diagnosis not present

## 2020-06-26 DIAGNOSIS — M48061 Spinal stenosis, lumbar region without neurogenic claudication: Secondary | ICD-10-CM | POA: Diagnosis not present

## 2020-06-26 DIAGNOSIS — M6281 Muscle weakness (generalized): Secondary | ICD-10-CM | POA: Diagnosis not present

## 2020-06-26 DIAGNOSIS — R2689 Other abnormalities of gait and mobility: Secondary | ICD-10-CM | POA: Diagnosis not present

## 2020-06-28 DIAGNOSIS — M6281 Muscle weakness (generalized): Secondary | ICD-10-CM | POA: Diagnosis not present

## 2020-06-28 DIAGNOSIS — M48061 Spinal stenosis, lumbar region without neurogenic claudication: Secondary | ICD-10-CM | POA: Diagnosis not present

## 2020-06-28 DIAGNOSIS — R2689 Other abnormalities of gait and mobility: Secondary | ICD-10-CM | POA: Diagnosis not present

## 2020-07-02 DIAGNOSIS — M6281 Muscle weakness (generalized): Secondary | ICD-10-CM | POA: Diagnosis not present

## 2020-07-02 DIAGNOSIS — M48061 Spinal stenosis, lumbar region without neurogenic claudication: Secondary | ICD-10-CM | POA: Diagnosis not present

## 2020-07-02 DIAGNOSIS — R2689 Other abnormalities of gait and mobility: Secondary | ICD-10-CM | POA: Diagnosis not present

## 2020-07-04 DIAGNOSIS — F411 Generalized anxiety disorder: Secondary | ICD-10-CM | POA: Diagnosis not present

## 2020-07-04 DIAGNOSIS — J449 Chronic obstructive pulmonary disease, unspecified: Secondary | ICD-10-CM | POA: Diagnosis not present

## 2020-07-04 DIAGNOSIS — R2689 Other abnormalities of gait and mobility: Secondary | ICD-10-CM | POA: Diagnosis not present

## 2020-07-04 DIAGNOSIS — E032 Hypothyroidism due to medicaments and other exogenous substances: Secondary | ICD-10-CM | POA: Diagnosis not present

## 2020-07-04 DIAGNOSIS — M6281 Muscle weakness (generalized): Secondary | ICD-10-CM | POA: Diagnosis not present

## 2020-07-04 DIAGNOSIS — M48061 Spinal stenosis, lumbar region without neurogenic claudication: Secondary | ICD-10-CM | POA: Diagnosis not present

## 2020-07-04 DIAGNOSIS — M545 Low back pain, unspecified: Secondary | ICD-10-CM | POA: Diagnosis not present

## 2020-07-09 DIAGNOSIS — R2689 Other abnormalities of gait and mobility: Secondary | ICD-10-CM | POA: Diagnosis not present

## 2020-07-09 DIAGNOSIS — M6281 Muscle weakness (generalized): Secondary | ICD-10-CM | POA: Diagnosis not present

## 2020-07-09 DIAGNOSIS — M48061 Spinal stenosis, lumbar region without neurogenic claudication: Secondary | ICD-10-CM | POA: Diagnosis not present

## 2020-07-11 DIAGNOSIS — R2689 Other abnormalities of gait and mobility: Secondary | ICD-10-CM | POA: Diagnosis not present

## 2020-07-11 DIAGNOSIS — M6281 Muscle weakness (generalized): Secondary | ICD-10-CM | POA: Diagnosis not present

## 2020-07-11 DIAGNOSIS — M48061 Spinal stenosis, lumbar region without neurogenic claudication: Secondary | ICD-10-CM | POA: Diagnosis not present

## 2020-07-16 DIAGNOSIS — M48061 Spinal stenosis, lumbar region without neurogenic claudication: Secondary | ICD-10-CM | POA: Diagnosis not present

## 2020-07-16 DIAGNOSIS — M6281 Muscle weakness (generalized): Secondary | ICD-10-CM | POA: Diagnosis not present

## 2020-07-16 DIAGNOSIS — R2689 Other abnormalities of gait and mobility: Secondary | ICD-10-CM | POA: Diagnosis not present

## 2020-07-18 DIAGNOSIS — M48061 Spinal stenosis, lumbar region without neurogenic claudication: Secondary | ICD-10-CM | POA: Diagnosis not present

## 2020-07-18 DIAGNOSIS — R2689 Other abnormalities of gait and mobility: Secondary | ICD-10-CM | POA: Diagnosis not present

## 2020-07-18 DIAGNOSIS — M6281 Muscle weakness (generalized): Secondary | ICD-10-CM | POA: Diagnosis not present

## 2020-07-23 DIAGNOSIS — R2689 Other abnormalities of gait and mobility: Secondary | ICD-10-CM | POA: Diagnosis not present

## 2020-07-23 DIAGNOSIS — M48061 Spinal stenosis, lumbar region without neurogenic claudication: Secondary | ICD-10-CM | POA: Diagnosis not present

## 2020-07-23 DIAGNOSIS — M6281 Muscle weakness (generalized): Secondary | ICD-10-CM | POA: Diagnosis not present

## 2020-07-25 DIAGNOSIS — M48061 Spinal stenosis, lumbar region without neurogenic claudication: Secondary | ICD-10-CM | POA: Diagnosis not present

## 2020-07-25 DIAGNOSIS — M6281 Muscle weakness (generalized): Secondary | ICD-10-CM | POA: Diagnosis not present

## 2020-07-25 DIAGNOSIS — R2689 Other abnormalities of gait and mobility: Secondary | ICD-10-CM | POA: Diagnosis not present

## 2020-07-30 DIAGNOSIS — M6281 Muscle weakness (generalized): Secondary | ICD-10-CM | POA: Diagnosis not present

## 2020-07-30 DIAGNOSIS — M48061 Spinal stenosis, lumbar region without neurogenic claudication: Secondary | ICD-10-CM | POA: Diagnosis not present

## 2020-07-30 DIAGNOSIS — R2689 Other abnormalities of gait and mobility: Secondary | ICD-10-CM | POA: Diagnosis not present

## 2020-08-01 DIAGNOSIS — M6281 Muscle weakness (generalized): Secondary | ICD-10-CM | POA: Diagnosis not present

## 2020-08-01 DIAGNOSIS — M48061 Spinal stenosis, lumbar region without neurogenic claudication: Secondary | ICD-10-CM | POA: Diagnosis not present

## 2020-08-01 DIAGNOSIS — R2689 Other abnormalities of gait and mobility: Secondary | ICD-10-CM | POA: Diagnosis not present

## 2020-08-05 DIAGNOSIS — R2689 Other abnormalities of gait and mobility: Secondary | ICD-10-CM | POA: Diagnosis not present

## 2020-08-05 DIAGNOSIS — M6281 Muscle weakness (generalized): Secondary | ICD-10-CM | POA: Diagnosis not present

## 2020-08-05 DIAGNOSIS — M48061 Spinal stenosis, lumbar region without neurogenic claudication: Secondary | ICD-10-CM | POA: Diagnosis not present

## 2020-08-08 DIAGNOSIS — R2689 Other abnormalities of gait and mobility: Secondary | ICD-10-CM | POA: Diagnosis not present

## 2020-08-08 DIAGNOSIS — M6281 Muscle weakness (generalized): Secondary | ICD-10-CM | POA: Diagnosis not present

## 2020-08-08 DIAGNOSIS — M48061 Spinal stenosis, lumbar region without neurogenic claudication: Secondary | ICD-10-CM | POA: Diagnosis not present

## 2020-08-13 DIAGNOSIS — M6281 Muscle weakness (generalized): Secondary | ICD-10-CM | POA: Diagnosis not present

## 2020-08-13 DIAGNOSIS — M48061 Spinal stenosis, lumbar region without neurogenic claudication: Secondary | ICD-10-CM | POA: Diagnosis not present

## 2020-08-13 DIAGNOSIS — R2689 Other abnormalities of gait and mobility: Secondary | ICD-10-CM | POA: Diagnosis not present

## 2020-08-15 DIAGNOSIS — M6281 Muscle weakness (generalized): Secondary | ICD-10-CM | POA: Diagnosis not present

## 2020-08-15 DIAGNOSIS — M48061 Spinal stenosis, lumbar region without neurogenic claudication: Secondary | ICD-10-CM | POA: Diagnosis not present

## 2020-08-15 DIAGNOSIS — R2689 Other abnormalities of gait and mobility: Secondary | ICD-10-CM | POA: Diagnosis not present

## 2020-08-26 DIAGNOSIS — J449 Chronic obstructive pulmonary disease, unspecified: Secondary | ICD-10-CM | POA: Diagnosis not present

## 2020-08-26 DIAGNOSIS — E032 Hypothyroidism due to medicaments and other exogenous substances: Secondary | ICD-10-CM | POA: Diagnosis not present

## 2020-08-26 DIAGNOSIS — M545 Low back pain, unspecified: Secondary | ICD-10-CM | POA: Diagnosis not present

## 2020-08-26 DIAGNOSIS — Z79891 Long term (current) use of opiate analgesic: Secondary | ICD-10-CM | POA: Diagnosis not present

## 2020-08-26 DIAGNOSIS — F411 Generalized anxiety disorder: Secondary | ICD-10-CM | POA: Diagnosis not present

## 2020-08-29 DIAGNOSIS — M810 Age-related osteoporosis without current pathological fracture: Secondary | ICD-10-CM | POA: Diagnosis not present

## 2020-08-29 DIAGNOSIS — Z780 Asymptomatic menopausal state: Secondary | ICD-10-CM | POA: Diagnosis not present

## 2020-08-29 DIAGNOSIS — M8588 Other specified disorders of bone density and structure, other site: Secondary | ICD-10-CM | POA: Diagnosis not present

## 2020-09-18 DIAGNOSIS — M48061 Spinal stenosis, lumbar region without neurogenic claudication: Secondary | ICD-10-CM | POA: Diagnosis not present

## 2020-09-18 DIAGNOSIS — M6281 Muscle weakness (generalized): Secondary | ICD-10-CM | POA: Diagnosis not present

## 2020-09-18 DIAGNOSIS — R2689 Other abnormalities of gait and mobility: Secondary | ICD-10-CM | POA: Diagnosis not present

## 2020-09-19 DIAGNOSIS — R2689 Other abnormalities of gait and mobility: Secondary | ICD-10-CM | POA: Diagnosis not present

## 2020-09-19 DIAGNOSIS — M6281 Muscle weakness (generalized): Secondary | ICD-10-CM | POA: Diagnosis not present

## 2020-09-19 DIAGNOSIS — M48061 Spinal stenosis, lumbar region without neurogenic claudication: Secondary | ICD-10-CM | POA: Diagnosis not present

## 2020-09-24 DIAGNOSIS — M48061 Spinal stenosis, lumbar region without neurogenic claudication: Secondary | ICD-10-CM | POA: Diagnosis not present

## 2020-09-24 DIAGNOSIS — M6281 Muscle weakness (generalized): Secondary | ICD-10-CM | POA: Diagnosis not present

## 2020-09-24 DIAGNOSIS — R2689 Other abnormalities of gait and mobility: Secondary | ICD-10-CM | POA: Diagnosis not present

## 2020-09-26 DIAGNOSIS — R2689 Other abnormalities of gait and mobility: Secondary | ICD-10-CM | POA: Diagnosis not present

## 2020-09-26 DIAGNOSIS — M6281 Muscle weakness (generalized): Secondary | ICD-10-CM | POA: Diagnosis not present

## 2020-09-26 DIAGNOSIS — M48061 Spinal stenosis, lumbar region without neurogenic claudication: Secondary | ICD-10-CM | POA: Diagnosis not present

## 2020-10-01 DIAGNOSIS — M48061 Spinal stenosis, lumbar region without neurogenic claudication: Secondary | ICD-10-CM | POA: Diagnosis not present

## 2020-10-01 DIAGNOSIS — R2689 Other abnormalities of gait and mobility: Secondary | ICD-10-CM | POA: Diagnosis not present

## 2020-10-01 DIAGNOSIS — M6281 Muscle weakness (generalized): Secondary | ICD-10-CM | POA: Diagnosis not present

## 2020-10-03 DIAGNOSIS — M6281 Muscle weakness (generalized): Secondary | ICD-10-CM | POA: Diagnosis not present

## 2020-10-03 DIAGNOSIS — M48061 Spinal stenosis, lumbar region without neurogenic claudication: Secondary | ICD-10-CM | POA: Diagnosis not present

## 2020-10-03 DIAGNOSIS — R2689 Other abnormalities of gait and mobility: Secondary | ICD-10-CM | POA: Diagnosis not present

## 2020-10-07 DIAGNOSIS — Z23 Encounter for immunization: Secondary | ICD-10-CM | POA: Diagnosis not present

## 2020-10-08 DIAGNOSIS — M6281 Muscle weakness (generalized): Secondary | ICD-10-CM | POA: Diagnosis not present

## 2020-10-08 DIAGNOSIS — R2689 Other abnormalities of gait and mobility: Secondary | ICD-10-CM | POA: Diagnosis not present

## 2020-10-08 DIAGNOSIS — M48061 Spinal stenosis, lumbar region without neurogenic claudication: Secondary | ICD-10-CM | POA: Diagnosis not present

## 2020-10-10 DIAGNOSIS — M48061 Spinal stenosis, lumbar region without neurogenic claudication: Secondary | ICD-10-CM | POA: Diagnosis not present

## 2020-10-10 DIAGNOSIS — M6281 Muscle weakness (generalized): Secondary | ICD-10-CM | POA: Diagnosis not present

## 2020-10-10 DIAGNOSIS — R2689 Other abnormalities of gait and mobility: Secondary | ICD-10-CM | POA: Diagnosis not present

## 2020-10-13 DIAGNOSIS — F411 Generalized anxiety disorder: Secondary | ICD-10-CM | POA: Diagnosis not present

## 2020-10-13 DIAGNOSIS — J449 Chronic obstructive pulmonary disease, unspecified: Secondary | ICD-10-CM | POA: Diagnosis not present

## 2020-10-13 DIAGNOSIS — E032 Hypothyroidism due to medicaments and other exogenous substances: Secondary | ICD-10-CM | POA: Diagnosis not present

## 2020-10-13 DIAGNOSIS — M545 Low back pain, unspecified: Secondary | ICD-10-CM | POA: Diagnosis not present

## 2020-10-19 ENCOUNTER — Emergency Department (HOSPITAL_COMMUNITY)
Admission: EM | Admit: 2020-10-19 | Discharge: 2020-10-19 | Disposition: A | Discharge status: Home / Self Care | Payer: No Typology Code available for payment source | Attending: Emergency Medicine | Admitting: Emergency Medicine

## 2020-10-19 ENCOUNTER — Emergency Department (HOSPITAL_COMMUNITY): Payer: No Typology Code available for payment source

## 2020-10-19 ENCOUNTER — Other Ambulatory Visit: Payer: Self-pay

## 2020-10-19 ENCOUNTER — Encounter (HOSPITAL_COMMUNITY): Payer: Self-pay | Admitting: *Deleted

## 2020-10-19 DIAGNOSIS — Y92410 Unspecified street and highway as the place of occurrence of the external cause: Secondary | ICD-10-CM | POA: Insufficient documentation

## 2020-10-19 DIAGNOSIS — T148XXA Other injury of unspecified body region, initial encounter: Secondary | ICD-10-CM | POA: Diagnosis not present

## 2020-10-19 DIAGNOSIS — M25512 Pain in left shoulder: Secondary | ICD-10-CM | POA: Insufficient documentation

## 2020-10-19 DIAGNOSIS — I10 Essential (primary) hypertension: Secondary | ICD-10-CM | POA: Insufficient documentation

## 2020-10-19 DIAGNOSIS — E039 Hypothyroidism, unspecified: Secondary | ICD-10-CM | POA: Insufficient documentation

## 2020-10-19 DIAGNOSIS — J441 Chronic obstructive pulmonary disease with (acute) exacerbation: Secondary | ICD-10-CM | POA: Diagnosis not present

## 2020-10-19 DIAGNOSIS — Z041 Encounter for examination and observation following transport accident: Secondary | ICD-10-CM | POA: Diagnosis not present

## 2020-10-19 DIAGNOSIS — I672 Cerebral atherosclerosis: Secondary | ICD-10-CM | POA: Diagnosis not present

## 2020-10-19 DIAGNOSIS — R519 Headache, unspecified: Secondary | ICD-10-CM | POA: Insufficient documentation

## 2020-10-19 DIAGNOSIS — Z96652 Presence of left artificial knee joint: Secondary | ICD-10-CM | POA: Insufficient documentation

## 2020-10-19 DIAGNOSIS — M542 Cervicalgia: Secondary | ICD-10-CM | POA: Diagnosis not present

## 2020-10-19 DIAGNOSIS — Z79899 Other long term (current) drug therapy: Secondary | ICD-10-CM | POA: Diagnosis not present

## 2020-10-19 DIAGNOSIS — I6523 Occlusion and stenosis of bilateral carotid arteries: Secondary | ICD-10-CM | POA: Diagnosis not present

## 2020-10-19 DIAGNOSIS — J45909 Unspecified asthma, uncomplicated: Secondary | ICD-10-CM | POA: Insufficient documentation

## 2020-10-19 DIAGNOSIS — F17210 Nicotine dependence, cigarettes, uncomplicated: Secondary | ICD-10-CM | POA: Diagnosis not present

## 2020-10-19 MED ORDER — ACETAMINOPHEN 325 MG PO TABS
650.0000 mg | ORAL_TABLET | Freq: Once | ORAL | Status: AC
  Administered 2020-10-19: 650 mg via ORAL
  Filled 2020-10-19: qty 2

## 2020-10-19 NOTE — ED Notes (Signed)
Pt going to CT

## 2020-10-19 NOTE — ED Notes (Signed)
MSE signature not obtained due to e-signature pad not working
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
## 2020-10-19 NOTE — Discharge Instructions (Addendum)
Imaging and exam are all reassuring.  Recommend over-the-counter pain medications as needed.  Please follow your PCP as needed.  Come back to the emergency department if you develop chest pain, shortness of breath, severe abdominal pain, uncontrolled nausea, vomiting, diarrhea.
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
## 2020-10-23 DIAGNOSIS — M545 Low back pain, unspecified: Secondary | ICD-10-CM | POA: Diagnosis not present

## 2020-10-23 DIAGNOSIS — I700 Atherosclerosis of aorta: Secondary | ICD-10-CM | POA: Diagnosis not present

## 2020-11-06 DIAGNOSIS — I700 Atherosclerosis of aorta: Secondary | ICD-10-CM | POA: Diagnosis not present

## 2020-11-06 DIAGNOSIS — M545 Low back pain, unspecified: Secondary | ICD-10-CM | POA: Diagnosis not present

## 2020-11-13 DIAGNOSIS — M818 Other osteoporosis without current pathological fracture: Secondary | ICD-10-CM | POA: Diagnosis not present

## 2020-11-13 DIAGNOSIS — E038 Other specified hypothyroidism: Secondary | ICD-10-CM | POA: Diagnosis not present

## 2020-11-13 DIAGNOSIS — I10 Essential (primary) hypertension: Secondary | ICD-10-CM | POA: Diagnosis not present

## 2020-11-13 DIAGNOSIS — M545 Low back pain, unspecified: Secondary | ICD-10-CM | POA: Diagnosis not present

## 2020-11-14 DIAGNOSIS — M545 Low back pain, unspecified: Secondary | ICD-10-CM | POA: Diagnosis not present

## 2020-11-14 DIAGNOSIS — R2689 Other abnormalities of gait and mobility: Secondary | ICD-10-CM | POA: Diagnosis not present

## 2020-11-19 DIAGNOSIS — R2689 Other abnormalities of gait and mobility: Secondary | ICD-10-CM | POA: Diagnosis not present

## 2020-11-19 DIAGNOSIS — M545 Low back pain, unspecified: Secondary | ICD-10-CM | POA: Diagnosis not present

## 2020-11-20 DIAGNOSIS — M545 Low back pain, unspecified: Secondary | ICD-10-CM | POA: Diagnosis not present

## 2020-11-21 DIAGNOSIS — R2689 Other abnormalities of gait and mobility: Secondary | ICD-10-CM | POA: Diagnosis not present

## 2020-11-21 DIAGNOSIS — M545 Low back pain, unspecified: Secondary | ICD-10-CM | POA: Diagnosis not present

## 2020-11-26 DIAGNOSIS — R2689 Other abnormalities of gait and mobility: Secondary | ICD-10-CM | POA: Diagnosis not present

## 2020-11-26 DIAGNOSIS — M545 Low back pain, unspecified: Secondary | ICD-10-CM | POA: Diagnosis not present

## 2020-11-29 DIAGNOSIS — R2689 Other abnormalities of gait and mobility: Secondary | ICD-10-CM | POA: Diagnosis not present

## 2020-11-29 DIAGNOSIS — M545 Low back pain, unspecified: Secondary | ICD-10-CM | POA: Diagnosis not present

## 2020-12-02 DIAGNOSIS — M545 Low back pain, unspecified: Secondary | ICD-10-CM | POA: Diagnosis not present

## 2020-12-02 DIAGNOSIS — R2689 Other abnormalities of gait and mobility: Secondary | ICD-10-CM | POA: Diagnosis not present

## 2020-12-06 DIAGNOSIS — M545 Low back pain, unspecified: Secondary | ICD-10-CM | POA: Diagnosis not present

## 2020-12-06 DIAGNOSIS — R2689 Other abnormalities of gait and mobility: Secondary | ICD-10-CM | POA: Diagnosis not present

## 2020-12-09 DIAGNOSIS — M545 Low back pain, unspecified: Secondary | ICD-10-CM | POA: Diagnosis not present

## 2020-12-09 DIAGNOSIS — R2689 Other abnormalities of gait and mobility: Secondary | ICD-10-CM | POA: Diagnosis not present

## 2020-12-11 DIAGNOSIS — M545 Low back pain, unspecified: Secondary | ICD-10-CM | POA: Diagnosis not present

## 2020-12-13 DIAGNOSIS — R2689 Other abnormalities of gait and mobility: Secondary | ICD-10-CM | POA: Diagnosis not present

## 2020-12-13 DIAGNOSIS — M545 Low back pain, unspecified: Secondary | ICD-10-CM | POA: Diagnosis not present

## 2020-12-16 DIAGNOSIS — R2689 Other abnormalities of gait and mobility: Secondary | ICD-10-CM | POA: Diagnosis not present

## 2020-12-16 DIAGNOSIS — M545 Low back pain, unspecified: Secondary | ICD-10-CM | POA: Diagnosis not present

## 2020-12-18 DIAGNOSIS — M545 Low back pain, unspecified: Secondary | ICD-10-CM | POA: Diagnosis not present

## 2020-12-31 DIAGNOSIS — Z23 Encounter for immunization: Secondary | ICD-10-CM | POA: Diagnosis not present

## 2021-01-09 DIAGNOSIS — M545 Low back pain, unspecified: Secondary | ICD-10-CM | POA: Diagnosis not present

## 2021-01-10 DIAGNOSIS — R2689 Other abnormalities of gait and mobility: Secondary | ICD-10-CM | POA: Diagnosis not present

## 2021-01-10 DIAGNOSIS — M545 Low back pain, unspecified: Secondary | ICD-10-CM | POA: Diagnosis not present

## 2021-01-13 DIAGNOSIS — R2689 Other abnormalities of gait and mobility: Secondary | ICD-10-CM | POA: Diagnosis not present

## 2021-01-13 DIAGNOSIS — M545 Low back pain, unspecified: Secondary | ICD-10-CM | POA: Diagnosis not present

## 2021-01-15 DIAGNOSIS — R2689 Other abnormalities of gait and mobility: Secondary | ICD-10-CM | POA: Diagnosis not present

## 2021-01-15 DIAGNOSIS — M545 Low back pain, unspecified: Secondary | ICD-10-CM | POA: Diagnosis not present

## 2021-01-21 DIAGNOSIS — R2689 Other abnormalities of gait and mobility: Secondary | ICD-10-CM | POA: Diagnosis not present

## 2021-01-21 DIAGNOSIS — M545 Low back pain, unspecified: Secondary | ICD-10-CM | POA: Diagnosis not present

## 2021-01-23 DIAGNOSIS — M545 Low back pain, unspecified: Secondary | ICD-10-CM | POA: Diagnosis not present

## 2021-01-23 DIAGNOSIS — R2689 Other abnormalities of gait and mobility: Secondary | ICD-10-CM | POA: Diagnosis not present

## 2021-01-30 DIAGNOSIS — M545 Low back pain, unspecified: Secondary | ICD-10-CM | POA: Diagnosis not present

## 2021-01-30 DIAGNOSIS — R2689 Other abnormalities of gait and mobility: Secondary | ICD-10-CM | POA: Diagnosis not present

## 2021-02-12 DIAGNOSIS — Z1331 Encounter for screening for depression: Secondary | ICD-10-CM | POA: Diagnosis not present

## 2021-02-12 DIAGNOSIS — I10 Essential (primary) hypertension: Secondary | ICD-10-CM | POA: Diagnosis not present

## 2021-02-12 DIAGNOSIS — Z0000 Encounter for general adult medical examination without abnormal findings: Secondary | ICD-10-CM | POA: Diagnosis not present

## 2021-02-12 DIAGNOSIS — M545 Low back pain, unspecified: Secondary | ICD-10-CM | POA: Diagnosis not present

## 2021-02-12 DIAGNOSIS — E038 Other specified hypothyroidism: Secondary | ICD-10-CM | POA: Diagnosis not present

## 2021-02-12 DIAGNOSIS — I700 Atherosclerosis of aorta: Secondary | ICD-10-CM | POA: Diagnosis not present

## 2021-02-26 DIAGNOSIS — G629 Polyneuropathy, unspecified: Secondary | ICD-10-CM | POA: Diagnosis not present

## 2021-04-15 DIAGNOSIS — M545 Low back pain, unspecified: Secondary | ICD-10-CM | POA: Diagnosis not present

## 2021-04-15 DIAGNOSIS — E038 Other specified hypothyroidism: Secondary | ICD-10-CM | POA: Diagnosis not present

## 2021-04-15 DIAGNOSIS — I700 Atherosclerosis of aorta: Secondary | ICD-10-CM | POA: Diagnosis not present

## 2021-04-15 DIAGNOSIS — G4730 Sleep apnea, unspecified: Secondary | ICD-10-CM | POA: Diagnosis not present

## 2021-04-15 DIAGNOSIS — I10 Essential (primary) hypertension: Secondary | ICD-10-CM | POA: Diagnosis not present

## 2021-04-15 DIAGNOSIS — G629 Polyneuropathy, unspecified: Secondary | ICD-10-CM | POA: Diagnosis not present

## 2021-06-04 DIAGNOSIS — Z20822 Contact with and (suspected) exposure to covid-19: Secondary | ICD-10-CM | POA: Diagnosis not present

## 2021-06-10 DIAGNOSIS — G629 Polyneuropathy, unspecified: Secondary | ICD-10-CM | POA: Diagnosis not present

## 2021-06-20 DIAGNOSIS — Z20822 Contact with and (suspected) exposure to covid-19: Secondary | ICD-10-CM | POA: Diagnosis not present

## 2021-07-21 DIAGNOSIS — Z20822 Contact with and (suspected) exposure to covid-19: Secondary | ICD-10-CM | POA: Diagnosis not present

## 2021-08-13 DIAGNOSIS — G629 Polyneuropathy, unspecified: Secondary | ICD-10-CM | POA: Diagnosis not present

## 2021-08-13 DIAGNOSIS — I700 Atherosclerosis of aorta: Secondary | ICD-10-CM | POA: Diagnosis not present

## 2021-08-13 DIAGNOSIS — I10 Essential (primary) hypertension: Secondary | ICD-10-CM | POA: Diagnosis not present

## 2021-08-13 DIAGNOSIS — G4730 Sleep apnea, unspecified: Secondary | ICD-10-CM | POA: Diagnosis not present

## 2021-08-13 DIAGNOSIS — E038 Other specified hypothyroidism: Secondary | ICD-10-CM | POA: Diagnosis not present

## 2021-11-24 DIAGNOSIS — E038 Other specified hypothyroidism: Secondary | ICD-10-CM | POA: Diagnosis not present

## 2021-11-24 DIAGNOSIS — I700 Atherosclerosis of aorta: Secondary | ICD-10-CM | POA: Diagnosis not present

## 2021-11-24 DIAGNOSIS — I10 Essential (primary) hypertension: Secondary | ICD-10-CM | POA: Diagnosis not present

## 2021-11-24 DIAGNOSIS — G629 Polyneuropathy, unspecified: Secondary | ICD-10-CM | POA: Diagnosis not present

## 2021-11-24 DIAGNOSIS — G4730 Sleep apnea, unspecified: Secondary | ICD-10-CM | POA: Diagnosis not present

## 2022-01-19 DIAGNOSIS — M5459 Other low back pain: Secondary | ICD-10-CM | POA: Diagnosis not present

## 2022-03-05 ENCOUNTER — Encounter (HOSPITAL_COMMUNITY): Payer: Self-pay | Admitting: Emergency Medicine

## 2022-03-05 ENCOUNTER — Other Ambulatory Visit: Payer: Self-pay

## 2022-03-05 ENCOUNTER — Emergency Department (HOSPITAL_COMMUNITY): Payer: Medicare Other

## 2022-03-05 ENCOUNTER — Emergency Department (HOSPITAL_COMMUNITY)
Admission: EM | Admit: 2022-03-05 | Discharge: 2022-03-05 | Disposition: A | Discharge status: Home / Self Care | Payer: Medicare Other | Attending: Emergency Medicine | Admitting: Emergency Medicine

## 2022-03-05 DIAGNOSIS — Z79890 Hormone replacement therapy (postmenopausal): Secondary | ICD-10-CM | POA: Diagnosis not present

## 2022-03-05 DIAGNOSIS — I2081 Angina pectoris with coronary microvascular dysfunction: Secondary | ICD-10-CM | POA: Diagnosis not present

## 2022-03-05 DIAGNOSIS — U071 COVID-19: Secondary | ICD-10-CM | POA: Insufficient documentation

## 2022-03-05 DIAGNOSIS — R059 Cough, unspecified: Secondary | ICD-10-CM | POA: Diagnosis not present

## 2022-03-05 DIAGNOSIS — J45909 Unspecified asthma, uncomplicated: Secondary | ICD-10-CM | POA: Insufficient documentation

## 2022-03-05 DIAGNOSIS — I10 Essential (primary) hypertension: Secondary | ICD-10-CM | POA: Insufficient documentation

## 2022-03-05 DIAGNOSIS — E039 Hypothyroidism, unspecified: Secondary | ICD-10-CM | POA: Diagnosis not present

## 2022-03-05 DIAGNOSIS — Z79899 Other long term (current) drug therapy: Secondary | ICD-10-CM | POA: Diagnosis not present

## 2022-03-05 DIAGNOSIS — R0789 Other chest pain: Secondary | ICD-10-CM | POA: Diagnosis not present

## 2022-03-05 DIAGNOSIS — R0602 Shortness of breath: Secondary | ICD-10-CM | POA: Diagnosis not present

## 2022-03-05 DIAGNOSIS — R918 Other nonspecific abnormal finding of lung field: Secondary | ICD-10-CM | POA: Diagnosis not present

## 2022-03-05 LAB — CBC
HCT: 35.6 % — ABNORMAL LOW (ref 36.0–46.0)
Hemoglobin: 11.7 g/dL — ABNORMAL LOW (ref 12.0–15.0)
MCH: 30.1 pg (ref 26.0–34.0)
MCHC: 32.9 g/dL (ref 30.0–36.0)
MCV: 91.5 fL (ref 80.0–100.0)
Platelets: 223 10*3/uL (ref 150–400)
RBC: 3.89 MIL/uL (ref 3.87–5.11)
RDW: 13.5 % (ref 11.5–15.5)
WBC: 7.1 10*3/uL (ref 4.0–10.5)
nRBC: 0 % (ref 0.0–0.2)

## 2022-03-05 LAB — BASIC METABOLIC PANEL
Anion gap: 7 (ref 5–15)
BUN: 12 mg/dL (ref 8–23)
CO2: 25 mmol/L (ref 22–32)
Calcium: 9 mg/dL (ref 8.9–10.3)
Chloride: 101 mmol/L (ref 98–111)
Creatinine, Ser: 0.94 mg/dL (ref 0.44–1.00)
GFR, Estimated: >60 mL/min (ref >60)
Glucose, Bld: 104 mg/dL — ABNORMAL HIGH (ref 70–99)
Potassium: 4.1 mmol/L (ref 3.5–5.1)
Sodium: 133 mmol/L — ABNORMAL LOW (ref 135–145)

## 2022-03-05 LAB — RESP PANEL BY RT-PCR (RSV, FLU A&B, COVID)  RVPGX2
Influenza A by PCR: NEGATIVE
Influenza B by PCR: NEGATIVE
Resp Syncytial Virus by PCR: NEGATIVE
SARS Coronavirus 2 by RT PCR: POSITIVE — AB

## 2022-03-05 LAB — TROPONIN I (HIGH SENSITIVITY): Troponin I (High Sensitivity): 9 ng/L (ref <18)

## 2022-03-05 MED ORDER — ACETAMINOPHEN 325 MG PO TABS
650.0000 mg | ORAL_TABLET | Freq: Once | ORAL | Status: AC
  Administered 2022-03-05: 650 mg via ORAL
  Filled 2022-03-05: qty 2

## 2022-03-05 MED ORDER — NIRMATRELVIR/RITONAVIR (PAXLOVID)TABLET
3.0000 | ORAL_TABLET | Freq: Two times a day (BID) | ORAL | 0 refills | Status: AC
Start: 2022-03-05 — End: 2022-03-10

## 2022-03-05 NOTE — ED Triage Notes (Signed)
Pt c/o cough x 1 week. C/o pain to epigastric area shooting to back. No other radiation started today. C/o sob today. Pt having some labored breathing in triage and shaky in triage. Pt currently on steroids, per pt that is why she is shaky.denies n/v/d. C/o headache and dizziness that is random. Color wnl. Non diaphoretic.

## 2022-03-05 NOTE — ED Provider Notes (Signed)
[facility] EMERGENCY DEPARTMENT Provider Note   CSN: [id number] Arrival date & time: 03/05/22  1642     History  Chief Complaint  Patient presents with   Cough   Chest Pain   Shortness of Breath    Alexis Ortega is a 75 y.o. female.  Pt is a 77 yo female with a pmhx significant for HTN, hypothyroidism, MDD, GERD, arthritis, fibromyalgia, and asthma.  Pt said she's had a cough for 2 days.  Today was the worst.  She called her pcp who sent her in a rx for a medrol dose pack.  Pt was feeling worse, so she came to the ED.  She did have a fever when she arrived and received tylenol.  She is feeling better now that her fever has broken.       Home Medications Prior to Admission medications   Medication Sig Start Date End Date Taking? Authorizing Provider  acetaminophen (TYLENOL) 500 MG tablet Take 500 mg by mouth every 6 (six) hours as needed for moderate pain or mild pain.    [provider]  ALPRAZolam Duanne Moron) 0.5 MG tablet Take 0.5 mg by mouth at bedtime.    [provider]  amLODipine (NORVASC) 10 MG tablet Take 10 mg by mouth daily. 03/30/14   [provider]  citalopram (CELEXA) 20 MG tablet Take 20 mg by mouth daily.    [provider]  fentaNYL (DURAGESIC) 25 MCG/HR Place 1 patch onto the skin every 3 (three) days.    [provider]  gabapentin (NEURONTIN) 300 MG capsule Take 300 mg by mouth 2 (two) times daily as needed (Pain). 11/08/19   [provider]  levothyroxine (SYNTHROID) 100 MCG tablet Take 100 mcg by mouth daily before breakfast.     [provider]  losartan (COZAAR) 100 MG tablet Take 100 mg by mouth daily.    [provider]  Multiple Vitamin (MULTIVITAMIN WITH MINERALS) TABS tablet Take 1 tablet by mouth daily.    [provider]  omeprazole (PRILOSEC) 40 MG capsule Take 1 capsule (40 mg total) by mouth 2 (two) times daily for 60 days, THEN 1 capsule (40 mg total) daily. 03/29/20  02/22/21  Harvel Quale, MD  ondansetron (ZOFRAN ODT) 4 MG disintegrating tablet '4mg'$  ODT q4 hours prn nausea/vomit Patient taking differently: Take 4 mg by mouth every 4 (four) hours as needed for nausea or vomiting. '4mg'$  ODT q4 hours prn nausea/vomit 03/26/19   Milton Ferguson, MD  PROAIR HFA 108 (90 BASE) MCG/ACT inhaler Inhale 2 puffs into the lungs every 4 (four) hours as needed for wheezing.  08/24/13   [provider]  Zinc 50 MG CAPS Take 50 mg by mouth daily.    [provider]      Allergies    Diclofenac and Tramadol    Review of Systems   Review of Systems  Respiratory:  Positive for cough.   All other systems reviewed and are negative.   Physical Exam Updated Vital Signs BP (!) 144/72   Pulse 85   Temp (!) 101.4 F (38.6 C) (Oral)   Resp (!) 24   SpO2 99%  Physical Exam Vitals and nursing note reviewed.  Constitutional:      Appearance: She is well-developed.  HENT:     Head: Normocephalic and atraumatic.  Eyes:     Extraocular Movements: Extraocular movements intact.     Pupils: Pupils are equal, round, and reactive to light.  Cardiovascular:  Rate and Rhythm: Normal rate and regular rhythm.     Heart sounds: Normal heart sounds.  Pulmonary:     Effort: Pulmonary effort is normal.     Breath sounds: Normal breath sounds.  Abdominal:     General: Bowel sounds are normal.     Palpations: Abdomen is soft.  Musculoskeletal:        General: Normal range of motion.     Cervical back: Normal range of motion and neck supple.  Skin:    General: Skin is warm.     Capillary Refill: Capillary refill takes less than 2 seconds.  Neurological:     General: No focal deficit present.     Mental Status: She is alert.  Psychiatric:        Mood and Affect: Mood normal.        Behavior: Behavior normal.     ED Results / Procedures / Treatments   Labs (all labs ordered are listed, but only abnormal results are displayed) Labs Reviewed   RESP PANEL BY RT-PCR (RSV, FLU A&B, COVID)  [id number] - Abnormal; Notable for the following components:      Result Value   SARS Coronavirus 2 by RT PCR POSITIVE (*)    All other components within normal limits  BASIC METABOLIC PANEL - Abnormal; Notable for the following components:   Sodium 133 (*)    Glucose, Bld 104 (*)    All other components within normal limits  CBC - Abnormal; Notable for the following components:   Hemoglobin 11.7 (*)    HCT 35.6 (*)    All other components within normal limits  TROPONIN I (HIGH SENSITIVITY)  TROPONIN I (HIGH SENSITIVITY)    EKG None  Radiology DG Chest 2 View  Result Date: 03/05/2022 CLINICAL DATA:  One-week history of cough and epigastric pain radiating to the back with acute shortness of breath EXAM: CHEST - 2 VIEW COMPARISON:  Chest radiograph dated 03/26/2019 FINDINGS: Mildly hyperinflated lungs. No focal consolidations. No pleural effusion or pneumothorax. The heart size and mediastinal contours are within normal limits. Cervical spinal fixation hardware appears intact. Right upper quadrant surgical clips. IMPRESSION: No evidence of acute cardiopulmonary disease. Electronically Signed   By: Darrin Nipper M.D.   On: 03/05/2022 17:24    Procedures Procedures    Medications Ordered in ED Medications  acetaminophen (TYLENOL) tablet 650 mg (650 mg Oral Given 03/05/22 1721)    ED Course/ Medical Decision Making/ A&P                           Medical Decision Making Amount and/or Complexity of Data Reviewed Labs: ordered. Radiology: ordered.  Risk OTC drugs.   This patient presents to the ED for concern of cough, this involves an extensive number of treatment options, and is a complaint that carries with it a high risk of complications and morbidity.  The differential diagnosis includes pna, covid/flu, bronchitis   Co morbidities that complicate the patient evaluation   HTN, hypothyroidism, MDD, GERD, arthritis, fibromyalgia, and  asthma   Additional history obtained:  Additional history obtained from epic chart review    Lab Tests:  I Ordered, and personally interpreted labs.  The pertinent results include:  cbc with hgb 11.7 (hgb 12.6 2 years ago); bmp nl; Covid+   Imaging Studies ordered:  I ordered imaging studies including cxr  I independently visualized and interpreted imaging which showed No evidence of acute cardiopulmonary disease.  I agree with the radiologist interpretation   Cardiac Monitoring:  The patient was maintained on a cardiac monitor.  I personally viewed and interpreted the cardiac monitored which showed an underlying rhythm of: nsr   Medicines ordered and prescription drug management:  I ordered medication including tylenol  for fever  Reevaluation of the patient after these medicines showed that the patient improved I have reviewed the patients home medicines and have made adjustments as needed   Problem List / ED Course:  Covid-19.  Pt has been vaccinated, but has only had one booster and not the updated booster.  Pt is saturating well.  She does not have pna.  She does not look toxic.  She is stable for d/c.  She is d/c with paxlovid.  She is to continue her steroids.  She has an inhaler at home.  She is to return if worse.  F/u with pcp.   Reevaluation:  After the interventions noted above, I reevaluated the patient and found that they have :improved   Social Determinants of Health:  Lives at home   Dispostion:  After consideration of the diagnostic results and the patients response to treatment, I feel that the patent would benefit from discharge with outpatient f/u.    Alexis Ortega was evaluated in Emergency Department on 03/05/2022 for the symptoms described in the history of present illness. She was evaluated in the context of the global COVID-19 pandemic, which necessitated consideration that the patient might be at risk for infection with the SARS-CoV-2  virus that causes COVID-19. Institutional protocols and algorithms that pertain to the evaluation of patients at risk for COVID-19 are in a state of rapid change based on information released by regulatory bodies including the CDC and federal and state organizations. These policies and algorithms were followed during the patient's care in the ED.         Final Clinical Impression(s) / ED Diagnoses Final diagnoses:  [id number]    Rx / DC Orders ED Discharge Orders     None         Isla Pence, MD 03/05/22 Kathyrn Drown

## 2022-03-05 NOTE — Discharge Instructions (Addendum)
Person Under Monitoring Name: Alexis Ortega  Location: [address] [place] [address]   Infection Prevention Recommendations for Individuals Confirmed to have, or Being Evaluated for, 2019 Novel Coronavirus (COVID-19) Infection Who Receive Care at Home  Individuals who are confirmed to have, or are being evaluated for, COVID-19 should follow the prevention steps below until a healthcare provider or local or state health department says they can return to normal activities.  Stay home except to get medical care You should restrict activities outside your home, except for getting medical care. Do not go to work, school, or public areas, and do not use public transportation or taxis.  Call ahead before visiting your doctor Before your medical appointment, call the healthcare provider and tell them that you have, or are being evaluated for, COVID-19 infection. This will help the healthcare provider's office take steps to keep other people from getting infected. Ask your healthcare provider to call the local or state health department.  Monitor your symptoms Seek prompt medical attention if your illness is worsening (e.g., difficulty breathing). Before going to your medical appointment, call the healthcare provider and tell them that you have, or are being evaluated for, COVID-19 infection. Ask your healthcare provider to call the local or state health department.  Wear a facemask You should wear a facemask that covers your nose and mouth when you are in the same room with other people and when you visit a healthcare provider. People who live with or visit you should also wear a facemask while they are in the same room with you.  Separate yourself from other people in your home As much as possible, you should stay in a different room from other people in your home. Also, you should use a separate bathroom, if available.  Avoid sharing household items You should not share  dishes, drinking glasses, cups, eating utensils, towels, bedding, or other items with other people in your home. After using these items, you should wash them thoroughly with soap and water.  Cover your coughs and sneezes Cover your mouth and nose with a tissue when you cough or sneeze, or you can cough or sneeze into your sleeve. Throw used tissues in a lined trash can, and immediately wash your hands with soap and water for at least 20 seconds or use an alcohol-based hand rub.  Wash your [facility] your hands often and thoroughly with soap and water for at least 20 seconds. You can use an alcohol-based hand sanitizer if soap and water are not available and if your hands are not visibly dirty. Avoid touching your eyes, nose, and mouth with unwashed hands.   Prevention Steps for Caregivers and Household Members of Individuals Confirmed to have, or Being Evaluated for, COVID-19 Infection Being Cared for in the Home  If you live with, or provide care at home for, a person confirmed to have, or being evaluated for, COVID-19 infection please follow these guidelines to prevent infection:  Follow healthcare provider's instructions Make sure that you understand and can help the patient follow any healthcare provider instructions for all care.  Provide for the patient's basic needs You should help the patient with basic needs in the home and provide support for getting groceries, prescriptions, and other personal needs.  Monitor the patient's symptoms If they are getting sicker, call his or her medical provider and tell them that the patient has, or is being evaluated for, COVID-19 infection. This will help the healthcare provider's office  take steps to keep other people from getting infected. Ask the healthcare provider to call the local or state health department.  Limit the number of people who have contact with the patient If possible, have only one caregiver for the patient. Other  household members should stay in another home or place of residence. If this is not possible, they should stay in another room, or be separated from the patient as much as possible. Use a separate bathroom, if available. Restrict visitors who do not have an essential need to be in the home.  Keep older adults, very young children, and other sick people away from the patient Keep older adults, very young children, and those who have compromised immune systems or chronic health conditions away from the patient. This includes people with chronic heart, lung, or kidney conditions, diabetes, and cancer.  Ensure good ventilation Make sure that shared spaces in the home have good air flow, such as from an air conditioner or an opened window, weather permitting.  Wash your hands often Wash your hands often and thoroughly with soap and water for at least 20 seconds. You can use an alcohol based hand sanitizer if soap and water are not available and if your hands are not visibly dirty. Avoid touching your eyes, nose, and mouth with unwashed hands. Use disposable paper towels to dry your hands. If not available, use dedicated cloth towels and replace them when they become wet.  Wear a facemask and gloves Wear a disposable facemask at all times in the room and gloves when you touch or have contact with the patient's blood, body fluids, and/or secretions or excretions, such as sweat, saliva, sputum, nasal mucus, vomit, urine, or feces.  Ensure the mask fits over your nose and mouth tightly, and do not touch it during use. Throw out disposable facemasks and gloves after using them. Do not reuse. Wash your hands immediately after removing your facemask and gloves. If your personal clothing becomes contaminated, carefully remove clothing and launder. Wash your hands after handling contaminated clothing. Place all used disposable facemasks, gloves, and other waste in a lined container before disposing them with  other household waste. Remove gloves and wash your hands immediately after handling these items.  Do not share dishes, glasses, or other household items with the patient Avoid sharing household items. You should not share dishes, drinking glasses, cups, eating utensils, towels, bedding, or other items with a patient who is confirmed to have, or being evaluated for, COVID-19 infection. After the person uses these items, you should wash them thoroughly with soap and water.  Wash laundry thoroughly Immediately remove and wash clothes or bedding that have blood, body fluids, and/or secretions or excretions, such as sweat, saliva, sputum, nasal mucus, vomit, urine, or feces, on them. Wear gloves when handling laundry from the patient. Read and follow directions on labels of laundry or clothing items and detergent. In general, wash and dry with the warmest temperatures recommended on the label.  Clean all areas the individual has used often Clean all touchable surfaces, such as counters, tabletops, doorknobs, bathroom fixtures, toilets, phones, keyboards, tablets, and bedside tables, every day. Also, clean any surfaces that may have blood, body fluids, and/or secretions or excretions on them. Wear gloves when cleaning surfaces the patient has come in contact with. Use a diluted bleach solution (e.g., dilute bleach with 1 part bleach and 10 parts water) or a household disinfectant with a label that says EPA-registered for coronaviruses. To make a bleach  solution at home, add 1 tablespoon of bleach to 1 quart (4 cups) of water. For a larger supply, add  cup of bleach to 1 gallon (16 cups) of water. Read labels of cleaning products and follow recommendations provided on product labels. Labels contain instructions for safe and effective use of the cleaning product including precautions you should take when applying the product, such as wearing gloves or eye protection and making sure you have good ventilation  during use of the product. Remove gloves and wash hands immediately after cleaning.  Monitor yourself for signs and symptoms of illness Caregivers and household members are considered close contacts, should monitor their health, and will be asked to limit movement outside of the home to the extent possible. Follow the monitoring steps for close contacts listed on the symptom monitoring form.   ? If you have additional questions, contact your local health department or call the epidemiologist on call at [phone] (available 24/7). ? This guidance is subject to change. For the most up-to-date guidance from [facility], please refer to their website: [email or web address]

## 2022-03-11 DIAGNOSIS — E860 Dehydration: Secondary | ICD-10-CM | POA: Diagnosis not present

## 2022-03-12 DIAGNOSIS — R197 Diarrhea, unspecified: Secondary | ICD-10-CM | POA: Diagnosis not present

## 2022-03-18 DIAGNOSIS — I10 Essential (primary) hypertension: Secondary | ICD-10-CM | POA: Diagnosis not present

## 2022-03-18 DIAGNOSIS — I700 Atherosclerosis of aorta: Secondary | ICD-10-CM | POA: Diagnosis not present

## 2022-03-18 DIAGNOSIS — Z0000 Encounter for general adult medical examination without abnormal findings: Secondary | ICD-10-CM | POA: Diagnosis not present

## 2022-03-18 DIAGNOSIS — M818 Other osteoporosis without current pathological fracture: Secondary | ICD-10-CM | POA: Diagnosis not present

## 2022-03-18 DIAGNOSIS — J449 Chronic obstructive pulmonary disease, unspecified: Secondary | ICD-10-CM | POA: Diagnosis not present

## 2022-03-18 DIAGNOSIS — M5459 Other low back pain: Secondary | ICD-10-CM | POA: Diagnosis not present

## 2022-03-18 DIAGNOSIS — E038 Other specified hypothyroidism: Secondary | ICD-10-CM | POA: Diagnosis not present

## 2022-03-18 DIAGNOSIS — E7849 Other hyperlipidemia: Secondary | ICD-10-CM | POA: Diagnosis not present

## 2022-03-18 DIAGNOSIS — U071 COVID-19: Secondary | ICD-10-CM | POA: Diagnosis not present

## 2022-06-17 DIAGNOSIS — R7303 Prediabetes: Secondary | ICD-10-CM | POA: Diagnosis not present

## 2022-06-17 DIAGNOSIS — M5459 Other low back pain: Secondary | ICD-10-CM | POA: Diagnosis not present

## 2022-06-17 DIAGNOSIS — E038 Other specified hypothyroidism: Secondary | ICD-10-CM | POA: Diagnosis not present

## 2022-06-17 DIAGNOSIS — Z0000 Encounter for general adult medical examination without abnormal findings: Secondary | ICD-10-CM | POA: Diagnosis not present

## 2022-06-17 DIAGNOSIS — J449 Chronic obstructive pulmonary disease, unspecified: Secondary | ICD-10-CM | POA: Diagnosis not present

## 2022-06-17 DIAGNOSIS — M818 Other osteoporosis without current pathological fracture: Secondary | ICD-10-CM | POA: Diagnosis not present

## 2022-06-17 DIAGNOSIS — I700 Atherosclerosis of aorta: Secondary | ICD-10-CM | POA: Diagnosis not present

## 2022-06-17 DIAGNOSIS — I10 Essential (primary) hypertension: Secondary | ICD-10-CM | POA: Diagnosis not present

## 2022-06-17 DIAGNOSIS — U071 COVID-19: Secondary | ICD-10-CM | POA: Diagnosis not present

## 2022-06-17 DIAGNOSIS — E7849 Other hyperlipidemia: Secondary | ICD-10-CM | POA: Diagnosis not present

## 2022-06-22 ENCOUNTER — Emergency Department (HOSPITAL_COMMUNITY): Payer: Medicare Other

## 2022-06-22 ENCOUNTER — Emergency Department (HOSPITAL_COMMUNITY)
Admission: EM | Admit: 2022-06-22 | Discharge: 2022-06-22 | Disposition: A | Discharge status: Home / Self Care | Payer: Medicare Other | Attending: Emergency Medicine | Admitting: Emergency Medicine

## 2022-06-22 ENCOUNTER — Encounter (INDEPENDENT_AMBULATORY_CARE_PROVIDER_SITE_OTHER): Payer: Self-pay | Admitting: *Deleted

## 2022-06-22 ENCOUNTER — Encounter (HOSPITAL_COMMUNITY): Payer: Self-pay

## 2022-06-22 DIAGNOSIS — S52502A Unspecified fracture of the lower end of left radius, initial encounter for closed fracture: Secondary | ICD-10-CM | POA: Insufficient documentation

## 2022-06-22 DIAGNOSIS — Y92002 Bathroom of unspecified non-institutional (private) residence single-family (private) house as the place of occurrence of the external cause: Secondary | ICD-10-CM | POA: Insufficient documentation

## 2022-06-22 DIAGNOSIS — S0990XA Unspecified injury of head, initial encounter: Secondary | ICD-10-CM | POA: Diagnosis not present

## 2022-06-22 DIAGNOSIS — R9082 White matter disease, unspecified: Secondary | ICD-10-CM | POA: Diagnosis not present

## 2022-06-22 DIAGNOSIS — W01198A Fall on same level from slipping, tripping and stumbling with subsequent striking against other object, initial encounter: Secondary | ICD-10-CM | POA: Diagnosis not present

## 2022-06-22 DIAGNOSIS — Z043 Encounter for examination and observation following other accident: Secondary | ICD-10-CM | POA: Diagnosis not present

## 2022-06-22 DIAGNOSIS — S0083XA Contusion of other part of head, initial encounter: Secondary | ICD-10-CM | POA: Insufficient documentation

## 2022-06-22 DIAGNOSIS — Y92009 Unspecified place in unspecified non-institutional (private) residence as the place of occurrence of the external cause: Secondary | ICD-10-CM

## 2022-06-22 MED ORDER — FENTANYL CITRATE PF 50 MCG/ML IJ SOSY
50.0000 ug | PREFILLED_SYRINGE | Freq: Once | INTRAMUSCULAR | Status: AC
  Administered 2022-06-22: 50 ug via INTRAMUSCULAR
  Filled 2022-06-22: qty 1

## 2022-06-22 MED ORDER — HYDROCODONE-ACETAMINOPHEN 5-325 MG PO TABS: 1.0000 | ORAL_TABLET | Freq: Four times a day (QID) | ORAL | 0 refills | Status: DC | PRN

## 2022-06-22 NOTE — ED Notes (Signed)
Assumed care of patient.

## 2022-06-22 NOTE — ED Triage Notes (Signed)
Patient arrives with husband. Reports walking to the bathroom and falling on the floor. Patient reports hitting her left arm and the left side of her face. Denies LOC. States she was not dizzy, but may have lost her footing. No blood thinners. Abrasion on the left side of face.

## 2022-06-22 NOTE — ED Provider Notes (Signed)
[place] EMERGENCY DEPARTMENT AT [facility]  Provider Note  CSN: [id number] Arrival date & time: 06/22/22 0557  History Chief Complaint  Patient presents with   Alexis Ortega is a 77 y.o. female reports she stumbled getting up to use the bathroom just prior to arrival hitting her head and injuring her L wrist. No LOC. She has a history of chronic pain, was previously on fentanyl patches, more recently tried buprenorphine but does not tolerate side effects well.    Home Medications Prior to Admission medications   Medication Sig Start Date End Date Taking? Authorizing Provider  HYDROcodone-acetaminophen (NORCO/VICODIN) 5-325 MG tablet Take 1 tablet by mouth every 6 (six) hours as needed for severe pain. 06/22/22  Yes Pollyann Savoy, MD  acetaminophen (TYLENOL) 500 MG tablet Take 500 mg by mouth every 6 (six) hours as needed for moderate pain or mild pain.    [provider]  ALPRAZolam Prudy Feeler) 0.5 MG tablet Take 0.5 mg by mouth at bedtime.    [provider]  amLODipine (NORVASC) 10 MG tablet Take 10 mg by mouth daily. 03/30/14   [provider]  citalopram (CELEXA) 20 MG tablet Take 20 mg by mouth daily.    [provider]  fentaNYL (DURAGESIC) 25 MCG/HR Place 1 patch onto the skin every 3 (three) days.    [provider]  gabapentin (NEURONTIN) 300 MG capsule Take 300 mg by mouth 2 (two) times daily as needed (Pain). 11/08/19   [provider]  levothyroxine (SYNTHROID) 100 MCG tablet Take 100 mcg by mouth daily before breakfast.     [provider]  losartan (COZAAR) 100 MG tablet Take 100 mg by mouth daily.    [provider]  Multiple Vitamin (MULTIVITAMIN WITH MINERALS) TABS tablet Take 1 tablet by mouth daily.    [provider]  omeprazole (PRILOSEC) 40 MG capsule Take 1 capsule (40 mg total) by mouth 2 (two) times daily for 60 days, THEN 1 capsule (40 mg total) daily. 03/29/20  02/22/21  Dolores Frame, MD  ondansetron (ZOFRAN ODT) 4 MG disintegrating tablet 4mg  ODT q4 hours prn nausea/vomit Patient taking differently: Take 4 mg by mouth every 4 (four) hours as needed for nausea or vomiting. 4mg  ODT q4 hours prn nausea/vomit 03/26/19   Bethann Berkshire, MD  PROAIR HFA 108 (90 BASE) MCG/ACT inhaler Inhale 2 puffs into the lungs every 4 (four) hours as needed for wheezing.  08/24/13   [provider]  Zinc 50 MG CAPS Take 50 mg by mouth daily.    [provider]     Allergies    Diclofenac and Tramadol   Review of Systems   Review of Systems Please see HPI for pertinent positives and negatives  Physical Exam BP 139/64 (BP Location: Right Arm)   Pulse 62   Temp 98.2 F (36.8 C) (Oral)   Resp 12   Ht 5\' 4"  (1.626 m)   Wt 68.9 kg   SpO2 99%   BMI 26.09 kg/m   Physical Exam Vitals and nursing note reviewed.  Constitutional:      Appearance: Normal appearance.  HENT:     Head: Normocephalic.     Comments: Contusion L temple    Nose: Nose normal.     Mouth/Throat:     Mouth: Mucous membranes are moist.  Eyes:     Extraocular Movements: Extraocular movements intact.     Conjunctiva/sclera: Conjunctivae normal.  Cardiovascular:  Rate and Rhythm: Normal rate.  Pulmonary:     Effort: Pulmonary effort is normal.     Breath sounds: Normal breath sounds.  Abdominal:     General: Abdomen is flat.     Palpations: Abdomen is soft.     Tenderness: There is no abdominal tenderness.  Musculoskeletal:        General: Swelling and tenderness (L wrist, dorsal soft tissue swelling, NVI) present.     Cervical back: Neck supple.  Skin:    General: Skin is warm and dry.  Neurological:     General: No focal deficit present.     Mental Status: She is alert.  Psychiatric:        Mood and Affect: Mood normal.     ED Results / Procedures / Treatments   EKG None  Procedures Procedures  Medications Ordered in the  ED Medications  fentaNYL (SUBLIMAZE) injection 50 mcg (50 mcg Intramuscular Given 06/22/22 0626)    Initial Impression and Plan  Patient here with mechanical fall,head and wrist injury. Imaging ordered. IM Fentanyl for pain.   ED Course   Clinical Course as of 06/22/22 0723  Mon Jun 22, 2022  [id number] I personally viewed the images from radiology studies and agree with radiologist interpretation: Xray shows distal radius fracture. Sugar tong splint ordered.  [CS]  [id number] I personally viewed the images from radiology studies and agree with radiologist interpretation: CT neg for acute injury. Plan outpatient ortho follow up, pain medication Rx, stool softener to avoid constipation. RTED for any other concerns.  [CS]    Clinical Course User Index [CS] Pollyann Savoy, MD     MDM Rules/Calculators/A&P Medical Decision Making Problems Addressed: Closed fracture of distal end of left radius, unspecified fracture morphology, initial encounter: acute illness or injury Fall in home, initial encounter: acute illness or injury Injury of head, initial encounter: acute illness or injury  Amount and/or Complexity of Data Reviewed Radiology: ordered and independent interpretation performed. Decision-making details documented in ED Course.  Risk Prescription drug management. Parenteral controlled substances.     Final Clinical Impression(s) / ED Diagnoses Final diagnoses:  Fall in home, initial encounter  Injury of head, initial encounter  Closed fracture of distal end of left radius, unspecified fracture morphology, initial encounter    Rx / DC Orders ED Discharge Orders          Ordered    HYDROcodone-acetaminophen (NORCO/VICODIN) 5-325 MG tablet  Every 6 hours PRN        06/22/22 0723             Pollyann Savoy, MD 06/22/22 [phone]

## 2022-06-24 ENCOUNTER — Ambulatory Visit (INDEPENDENT_AMBULATORY_CARE_PROVIDER_SITE_OTHER): Payer: Medicare Other | Admitting: Orthopedic Surgery

## 2022-06-24 ENCOUNTER — Encounter: Payer: Self-pay | Admitting: Orthopedic Surgery

## 2022-06-24 ENCOUNTER — Other Ambulatory Visit (INDEPENDENT_AMBULATORY_CARE_PROVIDER_SITE_OTHER): Payer: Medicare Other

## 2022-06-24 DIAGNOSIS — S52572A Other intraarticular fracture of lower end of left radius, initial encounter for closed fracture: Secondary | ICD-10-CM

## 2022-06-24 DIAGNOSIS — M25532 Pain in left wrist: Secondary | ICD-10-CM

## 2022-06-24 DIAGNOSIS — Z01818 Encounter for other preprocedural examination: Secondary | ICD-10-CM | POA: Diagnosis not present

## 2022-06-24 MED ORDER — OXYCODONE HCL 5 MG PO TABS: 5.0000 mg | ORAL_TABLET | Freq: Four times a day (QID) | ORAL | 0 refills | Status: DC | PRN

## 2022-06-25 ENCOUNTER — Telehealth: Payer: Self-pay

## 2022-06-25 NOTE — Telephone Encounter (Signed)
        Patient  visited [place] on 4/8  Telephone encounter attempt :  2nd  A HIPAA compliant voice message was left requesting a return call.  Instructed patient to call back .    Lenard Forth [facility] Guide, [facility] [phone] [address] [place], [place], [place] [address] Phone: [phone] Email: [email or web address]

## 2022-06-25 NOTE — Telephone Encounter (Signed)
        Patient  visited [place] on 4/8    Telephone encounter attempt :  1st  A HIPAA compliant voice message was left requesting a return call.  Instructed patient to call back .    Lenard Forth [facility] Guide, [facility] [phone] [address] [place], [place], [place] [address] Phone: [phone] Email: [email or web address]

## 2022-06-25 NOTE — Patient Instructions (Signed)
Alexis Ortega  06/25/2022      Alexis Ortega  06/25/2022     @PREFPERIOPPHARMACY @   Your procedure is scheduled on  06/29/2022.   Report to [facility] at  0900 A.M.   Call this number if you have problems the morning of surgery:  [phone]  If you experience any cold or flu symptoms such as cough, fever, chills, shortness of breath, etc. between now and your scheduled surgery, please notify us at the above number.   Remember:  Do not eat after midnight.    You may drink clear liquids until  0700 am on 06/29/2022.    Clear liquids allowed are:                    Water, Juice (No red color; non-citric and without pulp; diabetics please choose diet or no sugar options), Carbonated beverages (diabetics please choose diet or no sugar options), Clear Tea (No creamer, milk, or cream, including half & half and powdered creamer), Black Coffee Only (No creamer, milk or cream, including half & half and powdered creamer), Plain Jell-O Only (No red color; diabetics please choose no sugar options), Clear Sports drink (No red color; diabetics please choose diet or no sugar options), and Plain Popsicles Only (No red color; diabetics please choose no sugar options)       At 0700 am on 06/29/2022 drink your carb drink. You can have nothing else to drink after this.    Take these medicines the morning of surgery with A SIP OF WATER        amlodipine, oxycodone(if needed), celexa, gabapentin, levothyroxine, omeprazole, zofran (if needed).     Do not wear jewelry, make-up or nail polish.  Do not wear lotions, powders, or perfumes, or deodorant.  Do not shave 48 hours prior to surgery.  Men may shave face and neck.  Do not bring valuables to the hospital.  [facility] is not responsible for any belongings or valuables.  Contacts, dentures or bridgework may not be worn into surgery.  Leave your suitcase in the car.  After surgery it may be brought to your room.  For patients  admitted to the hospital, discharge time will be determined by your treatment team.  Patients discharged the day of surgery will not be allowed to drive home and must have someone with them for 24 hours.    Special instructions:   DO NOT smoke tobacco or vape for 24 hours before your procedure.  Please read over the following fact sheets that you were given. Coughing and Deep Breathing, Surgical Site Infection Prevention, Anesthesia Post-op Instructions, and Care and Recovery After Surgery            Closed Reduction for Wrist or Forearm, Care After After a closed reduction, it is common to have pain and swelling in the wrist or forearm. Follow these instructions at home: If you have a removable splint:  Wear the splint as told by your health care provider. Remove it only as told by your provider. Check the skin around the splint every day. Tell your provider about any concerns. Loosen the splint if your fingers tingle, become numb, or turn cold and blue. Keep the splint clean and dry. If you have a cast: Do not put pressure on any part of the cast until it is fully hardened. This may take several hours. Do not stick anything inside the cast to scratch  your skin. Doing that increases your risk of infection. Check the skin around the cast every day. Tell your provider about any concerns. You may put lotion on dry skin around the edges of the cast. Do not put lotion on the skin underneath the cast. Keep the cast clean and dry. Bathing If your splint or cast is not waterproof: Do not let it get wet. Cover it with a watertight covering when you take a bath or shower. Managing pain, stiffness, and swelling  If told, put ice on the injured area. If you have a removable splint, remove it as told by your provider. Put ice in a plastic bag. Place a towel between your skin and the bag or between your cast and the bag. Leave the ice on for 20 minutes, 2-3 times a day. If your skin  turns bright red, remove the ice right away to prevent skin damage. The risk of damage is higher if you cannot feel pain, heat, or cold. Move your fingers often to reduce stiffness and swelling. Raise (elevate) the injured area above the level of your heart while you are sitting or lying down. Driving Ask your provider if the medicine prescribed to you requires you to avoid driving or using heavy machinery. If you were given a sedative during the procedure, it can affect you for several hours. Do not drive or operate machinery until your provider says that it is safe. Ask your provider when it is safe to drive if you have a cast or splint on your arm. Activity Return to your normal activities as told by your provider. Ask your provider what activities are safe for you. Do exercises as told by your provider. General instructions Take over-the-counter and prescription medicines only as told by your provider. Do not use any products that contain nicotine or tobacco. These products include cigarettes, chewing tobacco, and vaping devices, such as e-cigarettes. These can delay bone healing. If you need help quitting, ask your provider. Keep all follow-up visits. Your provider will monitor your healing and activity. Your health care provider may give you more instructions. Make sure you know what you can and cannot do. Contact a health care provider if: You have a fever. Your pain is not controlled by your pain medicine. Get help right away if: You have severe pain. You have a severe increase in swelling. Your fingers become very cold or blue. You have numbness, tingling, or loss of feeling in your hands or fingers. This information is not intended to replace advice given to you by your health care provider. Make sure you discuss any questions you have with your health care provider. Document Revised: 11/24/2021 Document Reviewed: 11/24/2021 Elsevier Patient Education  2023 [employer] General  Anesthesia, Adult, Care After The following information offers guidance on how to care for yourself after your procedure. Your health care provider may also give you more specific instructions. If you have problems or questions, contact your health care provider. What can I expect after the procedure? After the procedure, it is common for people to: Have pain or discomfort at the IV site. Have nausea or vomiting. Have a sore throat or hoarseness. Have trouble concentrating. Feel cold or chills. Feel weak, sleepy, or tired (fatigue). Have soreness and body aches. These can affect parts of the body that were not involved in surgery. Follow these instructions at home: For the time period you were told by your health care provider:  Rest. Do not participate in activities where  you could fall or become injured. Do not drive or use machinery. Do not drink alcohol. Do not take sleeping pills or medicines that cause drowsiness. Do not make important decisions or sign legal documents. Do not take care of children on your own. General instructions Drink enough fluid to keep your urine pale yellow. If you have sleep apnea, surgery and certain medicines can increase your risk for breathing problems. Follow instructions from your health care provider about wearing your sleep device: Anytime you are sleeping, including during daytime naps. While taking prescription pain medicines, sleeping medicines, or medicines that make you drowsy. Return to your normal activities as told by your health care provider. Ask your health care provider what activities are safe for you. Take over-the-counter and prescription medicines only as told by your health care provider. Do not use any products that contain nicotine or tobacco. These products include cigarettes, chewing tobacco, and vaping devices, such as e-cigarettes. These can delay incision healing after surgery. If you need help quitting, ask your health care  provider. Contact a health care provider if: You have nausea or vomiting that does not get better with medicine. You vomit every time you eat or drink. You have pain that does not get better with medicine. You cannot urinate or have bloody urine. You develop a skin rash. You have a fever. Get help right away if: You have trouble breathing. You have chest pain. You vomit blood. These symptoms may be an emergency. Get help right away. Call 911. Do not wait to see if the symptoms will go away. Do not drive yourself to the hospital. Summary After the procedure, it is common to have a sore throat, hoarseness, nausea, vomiting, or to feel weak, sleepy, or fatigue. For the time period you were told by your health care provider, do not drive or use machinery. Get help right away if you have difficulty breathing, have chest pain, or vomit blood. These symptoms may be an emergency. This information is not intended to replace advice given to you by your health care provider. Make sure you discuss any questions you have with your health care provider. Document Revised: 05/30/2021 Document Reviewed: 05/30/2021 Elsevier Patient Education  2023 [employer] Inc. How to Use Chlorhexidine Before Surgery Chlorhexidine gluconate (CHG) is a germ-killing (antiseptic) solution that is used to clean the skin. It can get rid of the bacteria that normally live on the skin and can keep them away for about 24 hours. To clean your skin with CHG, you may be given: A CHG solution to use in the shower or as part of a sponge bath. A prepackaged cloth that contains CHG. Cleaning your skin with CHG may help lower the risk for infection: While you are staying in the intensive care unit of the hospital. If you have a vascular access, such as a central line, to provide short-term or long-term access to your veins. If you have a catheter to drain urine from your bladder. If you are on a ventilator. A ventilator is a machine  that helps you breathe by moving air in and out of your lungs. After surgery. What are the risks? Risks of using CHG include: A skin reaction. Hearing loss, if CHG gets in your ears and you have a perforated eardrum. Eye injury, if CHG gets in your eyes and is not rinsed out. The CHG product catching fire. Make sure that you avoid smoking and flames after applying CHG to your skin. Do not use CHG: If  you have a chlorhexidine allergy or have previously reacted to chlorhexidine. On babies younger than 60 months of age. How to use CHG solution Use CHG only as told by your health care provider, and follow the instructions on the label. Use the full amount of CHG as directed. Usually, this is one bottle. During a shower Follow these steps when using CHG solution during a shower (unless your health care provider gives you different instructions): Start the shower. Use your normal soap and shampoo to wash your face and hair. Turn off the shower or move out of the shower stream. Pour the CHG onto a clean washcloth. Do not use any type of brush or rough-edged sponge. Starting at your neck, lather your body down to your toes. Make sure you follow these instructions: If you will be having surgery, pay special attention to the part of your body where you will be having surgery. Scrub this area for at least 1 minute. Do not use CHG on your head or face. If the solution gets into your ears or eyes, rinse them well with water. Avoid your genital area. Avoid any areas of skin that have broken skin, cuts, or scrapes. Scrub your back and under your arms. Make sure to wash skin folds. Let the lather sit on your skin for 1-2 minutes or as long as told by your health care provider. Thoroughly rinse your entire body in the shower. Make sure that all body creases and crevices are rinsed well. Dry off with a clean towel. Do not put any substances on your body afterward--such as powder, lotion, or perfume--unless  you are told to do so by your health care provider. Only use lotions that are recommended by the manufacturer. Put on clean clothes or pajamas. If it is the night before your surgery, sleep in clean sheets.  During a sponge bath Follow these steps when using CHG solution during a sponge bath (unless your health care provider gives you different instructions): Use your normal soap and shampoo to wash your face and hair. Pour the CHG onto a clean washcloth. Starting at your neck, lather your body down to your toes. Make sure you follow these instructions: If you will be having surgery, pay special attention to the part of your body where you will be having surgery. Scrub this area for at least 1 minute. Do not use CHG on your head or face. If the solution gets into your ears or eyes, rinse them well with water. Avoid your genital area. Avoid any areas of skin that have broken skin, cuts, or scrapes. Scrub your back and under your arms. Make sure to wash skin folds. Let the lather sit on your skin for 1-2 minutes or as long as told by your health care provider. Using a different clean, wet washcloth, thoroughly rinse your entire body. Make sure that all body creases and crevices are rinsed well. Dry off with a clean towel. Do not put any substances on your body afterward--such as powder, lotion, or perfume--unless you are told to do so by your health care provider. Only use lotions that are recommended by the manufacturer. Put on clean clothes or pajamas. If it is the night before your surgery, sleep in clean sheets. How to use CHG prepackaged cloths Only use CHG cloths as told by your health care provider, and follow the instructions on the label. Use the CHG cloth on clean, dry skin. Do not use the CHG cloth on your head or face  unless your health care provider tells you to. When washing with the CHG cloth: Avoid your genital area. Avoid any areas of skin that have broken skin, cuts, or  scrapes. Before surgery Follow these steps when using a CHG cloth to clean before surgery (unless your health care provider gives you different instructions): Using the CHG cloth, vigorously scrub the part of your body where you will be having surgery. Scrub using a back-and-forth motion for 3 minutes. The area on your body should be completely wet with CHG when you are done scrubbing. Do not rinse. Discard the cloth and let the area air-dry. Do not put any substances on the area afterward, such as powder, lotion, or perfume. Put on clean clothes or pajamas. If it is the night before your surgery, sleep in clean sheets.  For general bathing Follow these steps when using CHG cloths for general bathing (unless your health care provider gives you different instructions). Use a separate CHG cloth for each area of your body. Make sure you wash between any folds of skin and between your fingers and toes. Wash your body in the following order, switching to a new cloth after each step: The front of your neck, shoulders, and chest. Both of your arms, under your arms, and your hands. Your stomach and groin area, avoiding the genitals. Your right leg and foot. Your left leg and foot. The back of your neck, your back, and your buttocks. Do not rinse. Discard the cloth and let the area air-dry. Do not put any substances on your body afterward--such as powder, lotion, or perfume--unless you are told to do so by your health care provider. Only use lotions that are recommended by the manufacturer. Put on clean clothes or pajamas. Contact a health care provider if: Your skin gets irritated after scrubbing. You have questions about using your solution or cloth. You swallow any chlorhexidine. Call your local poison control center [phone] in the [place] Get help right away if: Your eyes itch badly, or they become very red or swollen. Your skin itches badly and is red or swollen. Your hearing  changes. You have trouble seeing. You have swelling or tingling in your mouth or throat. You have trouble breathing. These symptoms may represent a serious problem that is an emergency. Do not wait to see if the symptoms will go away. Get medical help right away. Call your local emergency services (911 in the [place] Do not drive yourself to the hospital. Summary Chlorhexidine gluconate (CHG) is a germ-killing (antiseptic) solution that is used to clean the skin. Cleaning your skin with CHG may help to lower your risk for infection. You may be given CHG to use for bathing. It may be in a bottle or in a prepackaged cloth to use on your skin. Carefully follow your health care provider's instructions and the instructions on the product label. Do not use CHG if you have a chlorhexidine allergy. Contact your health care provider if your skin gets irritated after scrubbing. This information is not intended to replace advice given to you by your health care provider. Make sure you discuss any questions you have with your health care provider. Document Revised: 06/30/2021 Document Reviewed: 05/13/2020 Elsevier Patient Education  2023 [employer]

## 2022-06-26 ENCOUNTER — Encounter (HOSPITAL_COMMUNITY): Payer: Self-pay

## 2022-06-26 ENCOUNTER — Encounter (HOSPITAL_COMMUNITY)
Admission: RE | Admit: 2022-06-26 | Discharge: 2022-06-26 | Disposition: A | Discharge status: Home / Self Care | Payer: Medicare Other | Source: Ambulatory Visit | Attending: Orthopedic Surgery | Admitting: Orthopedic Surgery

## 2022-06-26 ENCOUNTER — Other Ambulatory Visit: Payer: Self-pay

## 2022-06-26 VITALS — BP 143/60 | HR 65 | Temp 98.1°F | Resp 18 | Ht 64.0 in | Wt 152.0 lb

## 2022-06-26 DIAGNOSIS — Z01818 Encounter for other preprocedural examination: Secondary | ICD-10-CM

## 2022-06-26 DIAGNOSIS — R300 Dysuria: Secondary | ICD-10-CM | POA: Diagnosis not present

## 2022-06-26 LAB — URINALYSIS, ROUTINE W REFLEX MICROSCOPIC
Bilirubin Urine: NEGATIVE
Glucose, UA: NEGATIVE mg/dL
Hgb urine dipstick: NEGATIVE
Ketones, ur: NEGATIVE mg/dL
Nitrite: NEGATIVE
Protein, ur: NEGATIVE mg/dL
Renal Epithelial: 1
Specific Gravity, Urine: 1.018 (ref 1.005–1.030)
WBC, UA: >50 WBC/hpf (ref 0–5)
pH: 5 (ref 5.0–8.0)

## 2022-06-26 LAB — BASIC METABOLIC PANEL
Anion gap: 11 (ref 5–15)
BUN: 19 mg/dL (ref 8–23)
CO2: 23 mmol/L (ref 22–32)
Calcium: 8.9 mg/dL (ref 8.9–10.3)
Chloride: 100 mmol/L (ref 98–111)
Creatinine, Ser: 1.03 mg/dL — ABNORMAL HIGH (ref 0.44–1.00)
GFR, Estimated: 56 mL/min — ABNORMAL LOW (ref >60)
Glucose, Bld: 122 mg/dL — ABNORMAL HIGH (ref 70–99)
Potassium: 4 mmol/L (ref 3.5–5.1)
Sodium: 134 mmol/L — ABNORMAL LOW (ref 135–145)

## 2022-06-26 LAB — CBC
HCT: 30.4 % — ABNORMAL LOW (ref 36.0–46.0)
Hemoglobin: 9.9 g/dL — ABNORMAL LOW (ref 12.0–15.0)
MCH: 31.1 pg (ref 26.0–34.0)
MCHC: 32.6 g/dL (ref 30.0–36.0)
MCV: 95.6 fL (ref 80.0–100.0)
Platelets: 301 10*3/uL (ref 150–400)
RBC: 3.18 MIL/uL — ABNORMAL LOW (ref 3.87–5.11)
RDW: 13.4 % (ref 11.5–15.5)
WBC: 10 10*3/uL (ref 4.0–10.5)
nRBC: 0 % (ref 0.0–0.2)

## 2022-06-26 NOTE — Progress Notes (Signed)
New Patient Visit  Assessment: Alexis Ortega is a 77 y.o. female with the following: 1. Other closed intra-articular fracture of distal end of left radius, initial encounter  Plan: Alexis Ortega fell and sustained a comminuted, intra-articular fracture, with dorsal angulation, radial deviation of the hand.  She was placed in a splint in the emergency department, but this was removed in clinic today as it was too tight.  I do not think a reduction attempt was made.  I am concerned that this will continue to further subside.  We discussed the possibility of proceeding to the operating room for closed reduction versus percutaneous pinning versus operative fixation.  Given her age, and health concerns, family would like to minimize the severity of the procedure is much as possible.  I am in agreement.  After discussing all this and detail, and answering all the questions and concerns, I am recommending we proceed the operating room for closed reduction with the possibility of percutaneous pinning.  They are in agreement with this plan.  Will plan to proceed next week.  Procedure was discussed in detail.  Risks are lower if we do not make an incision, or attempt to pin the fracture.  All questions have been answered.  She is in agreement with this plan.  She was placed into a new splint, to maintain stability, and help with her pain.  Cast application - Left short arm splint   Verbal consent was obtained and the correct extremity was identified. A well padded, appropriately molded short arm splint was applied to the Left arm Fingers remained warm and well perfused.   There were no sharp edges Patient tolerated the procedure well Cast care instructions were provided    Follow-up: Return for After surgery; DOS 06/29/22.  Subjective:  Chief Complaint  Patient presents with   Fracture    L wrist DOI 06/22/22    History of Present Illness: Alexis Ortega is a 77 y.o. female who presents for  evaluation of left wrist pain.  She lost her balance and fell.  She landed on outstretched right arm.  She had immediate pain.  She presented the emergency department.  She was evaluated, noted to have a distal radius fracture.  The wrist was placed in a sugar-tong splint.  She continues to have pain in the left wrist.  No numbness or tingling.   Review of Systems: No fevers or chills No numbness or tingling No chest pain No shortness of breath No bowel or bladder dysfunction No GI distress No headaches   Medical History:  Past Medical History:  Diagnosis Date   Arthritis    Asthma    Depression    Fibromyalgia    GERD (gastroesophageal reflux disease)    Hypertension    Hypothyroidism    Thyroid disease     Past Surgical History:  Procedure Laterality Date   ABDOMINAL HYSTERECTOMY     BACK SURGERY     BIOPSY  03/29/2020   Procedure: BIOPSY;  Surgeon: Castaneda Mayorga, Daniel, MD;  Location: [facility];  Service: Gastroenterology;;   CARPAL TUNNEL RELEASE Right    CHOLECYSTECTOMY     ESOPHAGEAL DILATION N/A 03/29/2020   Procedure: ESOPHAGEAL DILATION;  Surgeon: Castaneda Mayorga, Daniel, MD;  Location: [facility];  Service: Gastroenterology;  Laterality: N/A;   ESOPHAGOGASTRODUODENOSCOPY (EGD) WITH PROPOFOL N/A 03/29/2020   Procedure: ESOPHAGOGASTRODUODENOSCOPY (EGD) WITH PROPOFOL;  Surgeon: Castaneda Mayorga, Daniel, MD;  Location: [facility];  Service: Gastroenterology;    Laterality: N/A;  9:00   EYE SURGERY     cataract with lens implant   KNEE ARTHROSCOPY Left    LEFT HEART CATHETERIZATION WITH CORONARY ANGIOGRAM N/A 10/02/2013   Procedure: LEFT HEART CATHETERIZATION WITH CORONARY ANGIOGRAM;  Surgeon: Runell Gess, MD;  Location: [facility] CATH LAB;  Service: Cardiovascular;  Laterality: N/A;   NECK SURGERY     TOTAL KNEE ARTHROPLASTY Left 06/01/2012   Procedure: LEFT TOTAL KNEE ARTHROPLASTY;  Surgeon: Loreta Ave, MD;  Location: [facility] OR;  Service: Orthopedics;   Laterality: Left;   TUBAL LIGATION      Family History  Problem Relation Age of Onset   Allergies Son    Asthma Daughter    Social History   Tobacco Use   Smoking status: Former    Packs/day: 0.50    Years: 40.00    Additional pack years: 0.00    Total pack years: 20.00    Types: Cigarettes    Quit date: 06/14/2012    Years since quitting: 10.0   Smokeless tobacco: Never  Substance Use Topics   Alcohol use: No   Drug use: Yes    Comment: fentanyl patch Q three days    Allergies  Allergen Reactions   Diclofenac Rash   Tramadol Itching    Current Meds  Medication Sig   oxyCODONE (ROXICODONE) 5 MG immediate release tablet Take 1 tablet (5 mg total) by mouth every 6 (six) hours as needed for up to 7 days.    Objective: There were no vitals taken for this visit.  Physical Exam:  General: Elderly female., Alert and oriented., No acute distress., and Seated in a wheelchair.   Left forearm with residual bruising and swelling.  Mild deformity to the left wrist.  Fingers are warm and well-perfused.  There is diffuse swelling to her fingers.  Sensation is intact throughout her left hand.  No skin breakdown or ulcerations from the splint    IMAGING: I personally ordered and reviewed the following images  X-rays of the left wrist were obtained in the splint.  There is a comminuted, intra-articular fracture of the distal radius.  There appears to be some impaction.  Primary fragments include the radial styloid, as well as a dorsal and ulnar fracture fragment.  There is loss of radial height.  Dorsal tilt on the lateral projection.  Impression: Right distal radius fracture, with intra-articular extension and dorsal impaction   New Medications:  Meds ordered this encounter  Medications   oxyCODONE (ROXICODONE) 5 MG immediate release tablet    Sig: Take 1 tablet (5 mg total) by mouth every 6 (six) hours as needed for up to 7 days.    Dispense:  28 tablet    Refill:  0       Oliver Barre, MD  06/26/2022 8:10 AM

## 2022-06-26 NOTE — H&P (View-Only) (Signed)
New Patient Visit  Assessment: Alexis Ortega is a 77 y.o. female with the following: 1. Other closed intra-articular fracture of distal end of left radius, initial encounter  Plan: Alexis Ortega fell and sustained a comminuted, intra-articular fracture, with dorsal angulation, radial deviation of the hand.  She was placed in a splint in the emergency department, but this was removed in clinic today as it was too tight.  I do not think a reduction attempt was made.  I am concerned that this will continue to further subside.  We discussed the possibility of proceeding to the operating room for closed reduction versus percutaneous pinning versus operative fixation.  Given her age, and health concerns, family would like to minimize the severity of the procedure is much as possible.  I am in agreement.  After discussing all this and detail, and answering all the questions and concerns, I am recommending we proceed the operating room for closed reduction with the possibility of percutaneous pinning.  They are in agreement with this plan.  Will plan to proceed next week.  Procedure was discussed in detail.  Risks are lower if we do not make an incision, or attempt to pin the fracture.  All questions have been answered.  She is in agreement with this plan.  She was placed into a new splint, to maintain stability, and help with her pain.  Cast application - Left short arm splint   Verbal consent was obtained and the correct extremity was identified. A well padded, appropriately molded short arm splint was applied to the Left arm Fingers remained warm and well perfused.   There were no sharp edges Patient tolerated the procedure well Cast care instructions were provided    Follow-up: Return for After surgery; DOS 06/29/22.  Subjective:  Chief Complaint  Patient presents with   Fracture    L wrist DOI 06/22/22    History of Present Illness: Alexis Ortega is a 77 y.o. female who presents for  evaluation of left wrist pain.  She lost her balance and fell.  She landed on outstretched right arm.  She had immediate pain.  She presented the emergency department.  She was evaluated, noted to have a distal radius fracture.  The wrist was placed in a sugar-tong splint.  She continues to have pain in the left wrist.  No numbness or tingling.   Review of Systems: No fevers or chills No numbness or tingling No chest pain No shortness of breath No bowel or bladder dysfunction No GI distress No headaches   Medical History:  Past Medical History:  Diagnosis Date   Arthritis    Asthma    Depression    Fibromyalgia    GERD (gastroesophageal reflux disease)    Hypertension    Hypothyroidism    Thyroid disease     Past Surgical History:  Procedure Laterality Date   ABDOMINAL HYSTERECTOMY     BACK SURGERY     BIOPSY  03/29/2020   Procedure: BIOPSY;  Surgeon: Dolores Frame, MD;  Location: [facility];  Service: Gastroenterology;;   CARPAL TUNNEL RELEASE Right    CHOLECYSTECTOMY     ESOPHAGEAL DILATION N/A 03/29/2020   Procedure: ESOPHAGEAL DILATION;  Surgeon: Dolores Frame, MD;  Location: [facility];  Service: Gastroenterology;  Laterality: N/A;   ESOPHAGOGASTRODUODENOSCOPY (EGD) WITH PROPOFOL N/A 03/29/2020   Procedure: ESOPHAGOGASTRODUODENOSCOPY (EGD) WITH PROPOFOL;  Surgeon: Dolores Frame, MD;  Location: [facility];  Service: Gastroenterology;  Laterality: N/A;  9:00   EYE SURGERY     cataract with lens implant   KNEE ARTHROSCOPY Left    LEFT HEART CATHETERIZATION WITH CORONARY ANGIOGRAM N/A 10/02/2013   Procedure: LEFT HEART CATHETERIZATION WITH CORONARY ANGIOGRAM;  Surgeon: Runell Gess, MD;  Location: [facility] CATH LAB;  Service: Cardiovascular;  Laterality: N/A;   NECK SURGERY     TOTAL KNEE ARTHROPLASTY Left 06/01/2012   Procedure: LEFT TOTAL KNEE ARTHROPLASTY;  Surgeon: Loreta Ave, MD;  Location: [facility] OR;  Service: Orthopedics;   Laterality: Left;   TUBAL LIGATION      Family History  Problem Relation Age of Onset   Allergies Son    Asthma Daughter    Social History   Tobacco Use   Smoking status: Former    Packs/day: 0.50    Years: 40.00    Additional pack years: 0.00    Total pack years: 20.00    Types: Cigarettes    Quit date: 06/14/2012    Years since quitting: 10.0   Smokeless tobacco: Never  Substance Use Topics   Alcohol use: No   Drug use: Yes    Comment: fentanyl patch Q three days    Allergies  Allergen Reactions   Diclofenac Rash   Tramadol Itching    Current Meds  Medication Sig   oxyCODONE (ROXICODONE) 5 MG immediate release tablet Take 1 tablet (5 mg total) by mouth every 6 (six) hours as needed for up to 7 days.    Objective: There were no vitals taken for this visit.  Physical Exam:  General: Elderly female., Alert and oriented., No acute distress., and Seated in a wheelchair.   Left forearm with residual bruising and swelling.  Mild deformity to the left wrist.  Fingers are warm and well-perfused.  There is diffuse swelling to her fingers.  Sensation is intact throughout her left hand.  No skin breakdown or ulcerations from the splint    IMAGING: I personally ordered and reviewed the following images  X-rays of the left wrist were obtained in the splint.  There is a comminuted, intra-articular fracture of the distal radius.  There appears to be some impaction.  Primary fragments include the radial styloid, as well as a dorsal and ulnar fracture fragment.  There is loss of radial height.  Dorsal tilt on the lateral projection.  Impression: Right distal radius fracture, with intra-articular extension and dorsal impaction   New Medications:  Meds ordered this encounter  Medications   oxyCODONE (ROXICODONE) 5 MG immediate release tablet    Sig: Take 1 tablet (5 mg total) by mouth every 6 (six) hours as needed for up to 7 days.    Dispense:  28 tablet    Refill:  0       Oliver Barre, MD  06/26/2022 8:10 AM

## 2022-06-29 ENCOUNTER — Ambulatory Visit (HOSPITAL_COMMUNITY): Payer: Medicare Other

## 2022-06-29 ENCOUNTER — Other Ambulatory Visit: Payer: Self-pay

## 2022-06-29 ENCOUNTER — Ambulatory Visit (HOSPITAL_COMMUNITY)
Admission: RE | Admit: 2022-06-29 | Discharge: 2022-06-29 | Disposition: A | Discharge status: Home / Self Care | Payer: Medicare Other | Attending: Orthopedic Surgery | Admitting: Orthopedic Surgery

## 2022-06-29 ENCOUNTER — Encounter (HOSPITAL_COMMUNITY)
Admission: RE | Disposition: A | Discharge status: Home / Self Care | Payer: Self-pay | Source: Home / Self Care | Attending: Orthopedic Surgery

## 2022-06-29 ENCOUNTER — Ambulatory Visit (HOSPITAL_COMMUNITY): Payer: Medicare Other | Admitting: Anesthesiology

## 2022-06-29 ENCOUNTER — Ambulatory Visit (HOSPITAL_BASED_OUTPATIENT_CLINIC_OR_DEPARTMENT_OTHER): Payer: Medicare Other | Admitting: Anesthesiology

## 2022-06-29 ENCOUNTER — Encounter (HOSPITAL_COMMUNITY): Payer: Self-pay | Admitting: Orthopedic Surgery

## 2022-06-29 DIAGNOSIS — S52502A Unspecified fracture of the lower end of left radius, initial encounter for closed fracture: Secondary | ICD-10-CM

## 2022-06-29 DIAGNOSIS — S52572A Other intraarticular fracture of lower end of left radius, initial encounter for closed fracture: Secondary | ICD-10-CM | POA: Insufficient documentation

## 2022-06-29 DIAGNOSIS — M797 Fibromyalgia: Secondary | ICD-10-CM | POA: Diagnosis not present

## 2022-06-29 DIAGNOSIS — I10 Essential (primary) hypertension: Secondary | ICD-10-CM | POA: Diagnosis not present

## 2022-06-29 DIAGNOSIS — J449 Chronic obstructive pulmonary disease, unspecified: Secondary | ICD-10-CM

## 2022-06-29 DIAGNOSIS — S52102A Unspecified fracture of upper end of left radius, initial encounter for closed fracture: Secondary | ICD-10-CM

## 2022-06-29 DIAGNOSIS — M1990 Unspecified osteoarthritis, unspecified site: Secondary | ICD-10-CM | POA: Insufficient documentation

## 2022-06-29 DIAGNOSIS — D638 Anemia in other chronic diseases classified elsewhere: Secondary | ICD-10-CM

## 2022-06-29 DIAGNOSIS — F32A Depression, unspecified: Secondary | ICD-10-CM | POA: Insufficient documentation

## 2022-06-29 DIAGNOSIS — E039 Hypothyroidism, unspecified: Secondary | ICD-10-CM

## 2022-06-29 DIAGNOSIS — J45909 Unspecified asthma, uncomplicated: Secondary | ICD-10-CM | POA: Diagnosis not present

## 2022-06-29 DIAGNOSIS — Z87891 Personal history of nicotine dependence: Secondary | ICD-10-CM | POA: Diagnosis not present

## 2022-06-29 DIAGNOSIS — K219 Gastro-esophageal reflux disease without esophagitis: Secondary | ICD-10-CM | POA: Diagnosis not present

## 2022-06-29 DIAGNOSIS — W19XXXA Unspecified fall, initial encounter: Secondary | ICD-10-CM | POA: Insufficient documentation

## 2022-06-29 DIAGNOSIS — S5292XA Unspecified fracture of left forearm, initial encounter for closed fracture: Secondary | ICD-10-CM

## 2022-06-29 HISTORY — PX: PERCUTANEOUS PINNING: SHX2209

## 2022-06-29 HISTORY — PX: CLOSED REDUCTION RADIAL SHAFT: SHX5008

## 2022-06-29 SURGERY — CLOSED REDUCTION, FRACTURE, RADIUS, SHAFT
Anesthesia: General | Site: Wrist | Laterality: Left

## 2022-06-29 MED ORDER — ONDANSETRON HCL 4 MG/2ML IJ SOLN: 4.0000 mg | Freq: Once | INTRAMUSCULAR | Status: DC | PRN

## 2022-06-29 MED ORDER — PROPOFOL 500 MG/50ML IV EMUL
INTRAVENOUS | Status: AC
  Filled 2022-06-29: qty 50

## 2022-06-29 MED ORDER — SODIUM CHLORIDE 0.9 % IR SOLN
Status: DC | PRN
  Administered 2022-06-29: 1000 mL

## 2022-06-29 MED ORDER — LIDOCAINE HCL (PF) 1 % IJ SOLN
INTRAMUSCULAR | Status: AC
  Filled 2022-06-29: qty 30

## 2022-06-29 MED ORDER — HYDROMORPHONE HCL 1 MG/ML IJ SOLN
0.2500 mg | INTRAMUSCULAR | Status: DC | PRN
  Administered 2022-06-29: 0.5 mg via INTRAVENOUS
  Filled 2022-06-29: qty 0.5

## 2022-06-29 MED ORDER — CHLORHEXIDINE GLUCONATE 0.12 % MT SOLN
OROMUCOSAL | Status: AC
  Filled 2022-06-29: qty 15

## 2022-06-29 MED ORDER — OXYCODONE HCL 5 MG PO TABS: 5.0000 mg | ORAL_TABLET | ORAL | 0 refills | Status: AC | PRN

## 2022-06-29 MED ORDER — MIDAZOLAM HCL 2 MG/2ML IJ SOLN: 1.0000 mg | Freq: Once | INTRAMUSCULAR | Status: AC

## 2022-06-29 MED ORDER — ROCURONIUM BROMIDE 10 MG/ML (PF) SYRINGE
PREFILLED_SYRINGE | INTRAVENOUS | Status: AC
  Filled 2022-06-29: qty 10

## 2022-06-29 MED ORDER — DEXAMETHASONE SODIUM PHOSPHATE 4 MG/ML IJ SOLN
INTRAMUSCULAR | Status: AC
  Filled 2022-06-29: qty 1

## 2022-06-29 MED ORDER — CEFAZOLIN SODIUM-DEXTROSE 2-4 GM/100ML-% IV SOLN
2.0000 g | INTRAVENOUS | Status: AC
  Administered 2022-06-29: 2 g via INTRAVENOUS

## 2022-06-29 MED ORDER — PHENYLEPHRINE HCL (PRESSORS) 10 MG/ML IV SOLN
INTRAVENOUS | Status: DC | PRN
  Administered 2022-06-29: 80 ug via INTRAVENOUS

## 2022-06-29 MED ORDER — PHENYLEPHRINE 80 MCG/ML (10ML) SYRINGE FOR IV PUSH (FOR BLOOD PRESSURE SUPPORT)
PREFILLED_SYRINGE | INTRAVENOUS | Status: AC
  Filled 2022-06-29: qty 10

## 2022-06-29 MED ORDER — CHLORHEXIDINE GLUCONATE 0.12 % MT SOLN
15.0000 mL | Freq: Once | OROMUCOSAL | Status: AC
  Administered 2022-06-29: 15 mL via OROMUCOSAL

## 2022-06-29 MED ORDER — ROPIVACAINE HCL 5 MG/ML IJ SOLN
INTRAMUSCULAR | Status: AC
  Filled 2022-06-29: qty 30

## 2022-06-29 MED ORDER — ORAL CARE MOUTH RINSE: 15.0000 mL | Freq: Once | OROMUCOSAL | Status: AC

## 2022-06-29 MED ORDER — DEXAMETHASONE SODIUM PHOSPHATE 4 MG/ML IJ SOLN: INTRAMUSCULAR | Status: DC | PRN

## 2022-06-29 MED ORDER — MIDAZOLAM HCL 2 MG/2ML IJ SOLN
INTRAMUSCULAR | Status: AC
  Filled 2022-06-29: qty 2

## 2022-06-29 MED ORDER — ONDANSETRON HCL 4 MG PO TABS: 4.0000 mg | ORAL_TABLET | Freq: Three times a day (TID) | ORAL | 0 refills | Status: AC | PRN

## 2022-06-29 MED ORDER — LACTATED RINGERS IV SOLN: INTRAVENOUS | Status: DC

## 2022-06-29 MED ORDER — ROPIVACAINE HCL 5 MG/ML IJ SOLN
INTRAMUSCULAR | Status: DC | PRN
  Administered 2022-06-29: 25 mL via PERINEURAL

## 2022-06-29 MED ORDER — ONDANSETRON HCL 4 MG/2ML IJ SOLN
INTRAMUSCULAR | Status: AC
  Filled 2022-06-29: qty 2

## 2022-06-29 MED ORDER — DEXAMETHASONE SODIUM PHOSPHATE 4 MG/ML IJ SOLN
INTRAMUSCULAR | Status: DC | PRN
  Administered 2022-06-29: 6 mg via PERINEURAL

## 2022-06-29 MED ORDER — MIDAZOLAM HCL 2 MG/2ML IJ SOLN
INTRAMUSCULAR | Status: AC
  Administered 2022-06-29: 1 mg via INTRAVENOUS
  Filled 2022-06-29: qty 2

## 2022-06-29 MED ORDER — PROPOFOL 500 MG/50ML IV EMUL
INTRAVENOUS | Status: DC | PRN
  Administered 2022-06-29: 100 ug/kg/min via INTRAVENOUS
  Administered 2022-06-29: 50 mg via INTRAVENOUS

## 2022-06-29 MED ORDER — ONDANSETRON HCL 4 MG/2ML IJ SOLN
INTRAMUSCULAR | Status: DC | PRN
  Administered 2022-06-29: 4 mg via INTRAVENOUS

## 2022-06-29 MED ORDER — LIDOCAINE HCL (PF) 1 % IJ SOLN
INTRAMUSCULAR | Status: DC | PRN
  Administered 2022-06-29: 3 mL

## 2022-06-29 MED ORDER — CEFAZOLIN SODIUM-DEXTROSE 2-4 GM/100ML-% IV SOLN
INTRAVENOUS | Status: AC
  Filled 2022-06-29: qty 100

## 2022-06-29 SURGICAL SUPPLY — 31 items
APL PRP STRL LF DISP 70% ISPRP (MISCELLANEOUS) ×1
BNDG ELASTIC 4X5.8 VLCR STR LF (GAUZE/BANDAGES/DRESSINGS) IMPLANT
CHLORAPREP W/TINT 26 (MISCELLANEOUS) ×1 IMPLANT
CLOTH BEACON ORANGE TIMEOUT ST (SAFETY) ×2 IMPLANT
COVER LIGHT HANDLE STERIS (MISCELLANEOUS) ×2 IMPLANT
CUFF TOURN SGL QUICK 18 (TOURNIQUET CUFF) IMPLANT
DECANTER SPIKE VIAL GLASS SM (MISCELLANEOUS) ×1 IMPLANT
DRAPE C-ARM FOLDED MOBILE STRL (DRAPES) ×1 IMPLANT
GAUZE SPONGE 4X4 12PLY STRL (GAUZE/BANDAGES/DRESSINGS) ×1 IMPLANT
GAUZE XEROFORM 1X8 LF (GAUZE/BANDAGES/DRESSINGS) ×1 IMPLANT
GAUZE XEROFORM 5X9 LF (GAUZE/BANDAGES/DRESSINGS) IMPLANT
GLOVE BIO SURGEON STRL SZ8 (GLOVE) ×5 IMPLANT
GLOVE BIOGEL PI IND STRL 7.0 (GLOVE) ×4 IMPLANT
GLOVE ECLIPSE 6.5 STRL STRAW (GLOVE) IMPLANT
GLOVE SRG 8 PF TXTR STRL LF DI (GLOVE) ×1 IMPLANT
GLOVE SURG UNDER POLY LF SZ8 (GLOVE) ×1
GOWN STRL REUS W/ TWL XL LVL3 (GOWN DISPOSABLE) ×2 IMPLANT
GOWN STRL REUS W/TWL LRG LVL3 (GOWN DISPOSABLE) ×3 IMPLANT
GOWN STRL REUS W/TWL XL LVL3 (GOWN DISPOSABLE) ×1
INST SET MINOR BONE (KITS) IMPLANT
K-WIRE 229MX1.6 (WIRE) IMPLANT
KIT TURNOVER KIT A (KITS) ×2 IMPLANT
NS IRRIG 1000ML POUR BTL (IV SOLUTION) ×2 IMPLANT
PACK BASIC LIMB (CUSTOM PROCEDURE TRAY) ×1 IMPLANT
PAD ARMBOARD 7.5X6 YLW CONV (MISCELLANEOUS) ×1 IMPLANT
PAD CAST 4YDX4 CTTN HI CHSV (CAST SUPPLIES) IMPLANT
PADDING CAST COTTON 4X4 STRL (CAST SUPPLIES) ×2
PIN CAPS ORTHO GREEN .062 (PIN) IMPLANT
SET BASIN LINEN APH (SET/KITS/TRAYS/PACK) ×1 IMPLANT
SLING ARM FOAM STRAP LRG (SOFTGOODS) IMPLANT
SPLINT PLASTER CAST XFAST 5X30 (CAST SUPPLIES) IMPLANT

## 2022-06-29 NOTE — Interval H&P Note (Signed)
History and Physical Interval Note:  06/29/2022 10:15 AM  Alexis Ortega  has presented today for surgery, with the diagnosis of Comminuted left distal radius fracture.  The various methods of treatment have been discussed with the patient and family. After consideration of risks, benefits and other options for treatment, the patient has consented to  Procedure(s) with comments: CLOSED REDUCTION RADIAL SHAFT (Left) - Closed reduction left distal radius fracture PERCUTANEOUS PINNING EXTREMITY (Left) - Closed reduction and pinning of distal radius fracture as a surgical intervention.  The patient's history has been reviewed, patient examined, no change in status, stable for surgery.  I have reviewed the patient's chart and labs.  Questions were answered to the patient's satisfaction.     Oliver Barre

## 2022-06-29 NOTE — Brief Op Note (Signed)
06/29/2022  11:48 AM  PATIENT:  Alexis Ortega  77 y.o. female  PRE-OPERATIVE DIAGNOSIS:  Comminuted left distal radius fracture  POST-OPERATIVE DIAGNOSIS:  Comminuted left distal radius fracture  PROCEDURE:  Procedure(s) with comments: CLOSED REDUCTION RADIAL SHAFT (Left) - Closed reduction left distal radius fracture PERCUTANEOUS PINNING EXTREMITY (Left) - Closed reduction and pinning of distal radius fracture  SURGEON:  Surgeon(s) and Role: Oliver Barre, MD - Primary   PHYSICIAN ASSISTANT: None  ASSISTANTS: none   ANESTHESIA:   regional and IV sedation  EBL:  1 mL   BLOOD ADMINISTERED:none  DRAINS: none   LOCAL MEDICATIONS USED:  NONE  SPECIMEN:  No Specimen  DISPOSITION OF SPECIMEN:  N/A  COUNTS:  YES  TOURNIQUET:   Total Tourniquet Time Documented: Upper Arm (Left) - 24 minutes Total: Upper Arm (Left) - 24 minutes   DICTATION: .Note written in EPIC  PLAN OF CARE: Discharge to home after PACU  PATIENT DISPOSITION:  PACU - hemodynamically stable.   Delay start of Pharmacological VTE agent (>24hrs) due to surgical blood loss or risk of bleeding: yes

## 2022-06-29 NOTE — Anesthesia Postprocedure Evaluation (Signed)
Anesthesia Post Note  Patient: Alexis Ortega  Procedure(s) Performed: ATTTEMPTED CLOSED REDUCTION RADIAL SHAFT (Left: Wrist) PERCUTANEOUS PINNING OF DISTAL RADIUS (Left: Wrist)  Patient location during evaluation: Phase II Anesthesia Type: General Level of consciousness: awake and alert and oriented Pain management: pain level controlled Vital Signs Assessment: post-procedure vital signs reviewed and stable Respiratory status: spontaneous breathing, nonlabored ventilation and respiratory function stable Cardiovascular status: blood pressure returned to baseline and stable Postop Assessment: no apparent nausea or vomiting Anesthetic complications: no  No notable events documented.   Last Vitals:  Vitals:   06/29/22 1245 06/29/22 1258  BP: (!) 145/66 (!) 143/59  Pulse: 66 72  Resp: 14 16  Temp:  (!) 36.3 C  SpO2: 96% 95%    Last Pain:  Vitals:   06/29/22 1259  TempSrc:   PainSc: 0-No pain                 Cayli Escajeda C Rifka Ramey

## 2022-06-29 NOTE — Transfer of Care (Signed)
Immediate Anesthesia Transfer of Care Note  Patient: Alexis Ortega  Procedure(s) Performed: CLOSED REDUCTION RADIAL SHAFT (Left: Wrist) PERCUTANEOUS PINNING EXTREMITY (Left: Wrist)  Patient Location: PACU  Anesthesia Type:GA combined with regional for post-op pain  Level of Consciousness: awake, alert , and oriented  Airway & Oxygen Therapy: Patient Spontanous Breathing  Post-op Assessment: Report given to RN, Post -op Vital signs reviewed and stable, and Patient able to stick tongue midline  Post vital signs: Reviewed  Last Vitals:  Vitals Value Taken Time  BP 114/52   Temp 98.2   Pulse 59 06/29/22 1153  Resp 17 06/29/22 1153  SpO2 96 % 06/29/22 1153  Vitals shown include unvalidated device data.  Last Pain:  Vitals:   06/29/22 0943  TempSrc: Oral  PainSc: 4       Patients Stated Pain Goal: 5 (06/29/22 0943)  Complications: No notable events documented.

## 2022-06-29 NOTE — Anesthesia Preprocedure Evaluation (Signed)
Anesthesia Evaluation  Patient identified by MRN, date of birth, ID band Patient awake    Reviewed: Allergy & Precautions, H&P , NPO status , Patient's Chart, lab work & pertinent test results  Airway Mallampati: II  TM Distance: >3 FB Neck ROM: Full    Dental  (+) Dental Advisory Given, Edentulous Upper, Missing   Pulmonary asthma , COPD, Patient abstained from smoking., former smoker   Pulmonary exam normal breath sounds clear to auscultation       Cardiovascular hypertension, Pt. on medications Normal cardiovascular exam Rhythm:Regular Rate:Normal     Neuro/Psych  PSYCHIATRIC DISORDERS  Depression     Neuromuscular disease    GI/Hepatic Neg liver ROS,GERD  Controlled,,  Endo/Other  Hypothyroidism    Renal/GU negative Renal ROS  negative genitourinary   Musculoskeletal  (+) Arthritis , Osteoarthritis,  Fibromyalgia -  Abdominal   Peds negative pediatric ROS (+)  Hematology  (+) Blood dyscrasia, anemia   Anesthesia Other Findings   Reproductive/Obstetrics negative OB ROS                              Anesthesia Physical Anesthesia Plan  ASA: 3  Anesthesia Plan: General   Post-op Pain Management: Dilaudid IV and Regional block*   Induction: Intravenous  PONV Risk Score and Plan: 3 and Ondansetron, Dexamethasone and Propofol infusion  Airway Management Planned: Natural Airway and Nasal Cannula  Additional Equipment:   Intra-op Plan:   Post-operative Plan:   Informed Consent: I have reviewed the patients History and Physical, chart, labs and discussed the procedure including the risks, benefits and alternatives for the proposed anesthesia with the patient or authorized representative who has indicated his/her understanding and acceptance.     Dental advisory given  Plan Discussed with: CRNA and Surgeon  Anesthesia Plan Comments:          Anesthesia Quick  Evaluation

## 2022-06-29 NOTE — Discharge Instructions (Signed)
Wilhemenia Camba A. Dallas Schimke, MD MS [facility] [address] [place],  [place]  [address] Phone: [phone] Fax: [phone]   POST-OPERATIVE INSTRUCTIONS - UPPER EXTREMITY   WOUND CARE Please keep splint clean dry and intact until followup.  You may shower on Post-Op Day #2.  You must keep splint dry during this process and may find that a plastic bag taped around the arm or alternatively a towel based bath may be a better option.   If you get your splint wet or if it is damaged please contact our clinic.  EXERCISES Due to your splint being in place you will not be able to bear weight through your extremity.   DO NOT PUT ANY WEIGHT ON YOUR OPERATIVE ARM   REGIONAL ANESTHESIA (NERVE BLOCKS) The anesthesia team may have performed a nerve block for you if safe in the setting of your care.  This is a great tool used to minimize pain.  Typically the block may start wearing off overnight but the long acting medicine may last for 3-4 days.  The nerve block wearing off can be a challenging period but please utilize your as needed pain medications to try and manage this period.    POST-OP MEDICATIONS- Multimodal approach to pain control  In general your pain will be controlled with a combination of substances.  Prescriptions unless otherwise discussed are electronically sent to your pharmacy.  This is a carefully made plan we use to minimize narcotic use.     - Acetaminophen - Non-narcotic pain medicine taken on a scheduled basis   - Oxycodone - This is a strong narcotic, to be used only on an "as needed" basis for pain.  -  Zofran - take as needed for nausea   FOLLOW-UP If you develop a Fever (>101.5), Redness or Drainage from the surgical incision site, please call our office to arrange for an evaluation. Please call the office to schedule a follow-up appointment for your incision check if you do not already have one, 10-14 days post-operatively.  IF YOU HAVE ANY  QUESTIONS, PLEASE FEEL FREE TO CALL OUR OFFICE.  HELPFUL INFORMATION  If you had a block, it will wear off between 8-24 hrs postop typically.  This is period when your pain may go from nearly zero to the pain you would have had postop without the block.  This is an abrupt transition but nothing dangerous is happening.  You may take an extra dose of narcotic when this happens.  You should wean off your narcotic medicines as soon as you are able.  Most patients will be off or using minimal narcotics before their first postop appointment.   Elevating your leg will help with swelling and pain control.  You are encouraged to elevate your leg as much as possible in the first couple of weeks following surgery.  Imagine a drop of water on your toe, and your goal is to get that water back to your heart.  We suggest you use the pain medication the first night prior to going to bed, in order to ease any pain when the anesthesia wears off. You should avoid taking pain medications on an empty stomach as it will make you nauseous.  Do not drink alcoholic beverages or take illicit drugs when taking pain medications.  In most states it is against the law to drive while you are in a splint or sling.  And certainly against the law to drive while taking narcotics.  You may return to work/school in the next couple of days when you feel up to it.   Pain medication may make you constipated.  Below are a few solutions to try in this order: Decrease the amount of pain medication if you aren't having pain. Drink lots of decaffeinated fluids. Drink prune juice and/or each dried prunes  If the first 3 don't work start with additional solutions Take Colace - an over-the-counter stool softener Take Senokot - an over-the-counter laxative Take Miralax - a stronger over-the-counter laxative

## 2022-06-29 NOTE — Anesthesia Procedure Notes (Signed)
Procedure Name: General with mask airway Date/Time: 06/29/2022 10:33 AM  Performed by: Cy Blamer, CRNAPre-anesthesia Checklist: Patient identified, Emergency Drugs available, Suction available, Patient being monitored and Timeout performed Patient Re-evaluated:Patient Re-evaluated prior to induction Oxygen Delivery Method: Circle system utilized and Nasal cannula Induction Type: IV induction Placement Confirmation: positive ETCO2 Dental Injury: Teeth and Oropharynx as per pre-operative assessment

## 2022-06-29 NOTE — Anesthesia Procedure Notes (Signed)
Anesthesia Regional Block: Supraclavicular block   Pre-Anesthetic Checklist: , timeout performed,  Correct Patient, Correct Site, Correct Laterality,  Correct Procedure, Correct Position, site marked,  Risks and benefits discussed,  At surgeon's request and post-op pain management  Laterality: Upper  Prep: chloraprep       Needles:  Injection technique: Single-shot  Needle Type: Echogenic Stimulator Needle     Needle Length: 8.3cm  Needle Gauge: 22   Needle insertion depth: 6 cm   Additional Needles:   Procedures:,,,, ultrasound used (permanent image in chart),,    Narrative:  Start time: 06/29/2022 10:18 AM End time: 06/29/2022 10:22 AM Injection made incrementally with aspirations every 5 mL.  Performed by: Personally  Anesthesiologist: Molli Barrows, MD  Additional Notes: Block assessed prior to start of surgery

## 2022-06-29 NOTE — OR Nursing (Signed)
Procedure time out performed  1015  Block started at 1018 after the versed 1 mg given via IV.      Inject 1 cc and then followed with 4 cc . 2 cc injected. 2 cc injected 2 cc injected  2 cc injected 2 cc injected 1 cc injected 1 cc injected 3 cc injected 1 cc injected 1 cc injected 3cc injected 1 cc injected Total 26 cc injected Via Dr. Pilar Plate

## 2022-06-29 NOTE — Op Note (Signed)
Orthopaedic Surgery Operative Note (CSN: [id number])  Alexis Ortega  [birth year] Date of Surgery: 06/29/2022   Diagnoses:  Comminuted left distal radius fracture  Procedure: Attempted closed reduction of comminuted left distal radius fracture with percutaneous pinning   Operative Finding Successful completion of the planned procedure.  We attempted a closed reduction of a distal left radius fracture.  With minimal traction and manipulation, multiple skin tears were incurred.  As result, made the decision to proceed with percutaneous pinning.  Wrist was pinned in place, with improved alignment.  Splint was placed.   Post-Op Diagnosis: Same Surgeons:Primary: Oliver Barre, MD Assistants: None Location: [facility] OR ROOM 4 Anesthesia: General with regional anesthesia Antibiotics: Ancef 2 g Tourniquet time:  Total Tourniquet Time Documented: Upper Arm (Left) - 24 minutes Total: Upper Arm (Left) - 24 minutes  Estimated Blood Loss: 10 cc Complications: None Specimens: None  Implants: 2 x 1.6 mm K-wires  Indications for Surgery:   Alexis Ortega is a 77 y.o. female who fell and sustained a displaced, comminuted left distal radius fracture.  Reviewed the radiographs with the patient and her family in clinic.  Given the severity of the injury, as well as the overall displacement, I felt that this at least needed an attempted closed reduction.  Given her medical history, minimal surgery was ideal.  As such, we plan to proceed with a percutaneous pinning, if unable to sufficiently reduce the distal radius.  Benefits and risks of operative and nonoperative management were discussed prior to surgery with the patient and her family and informed consent form was completed.  Specific risks including infection, need for additional surgery, bleeding, stiffness, persistent pain, nonunion, malunion and more severe complications associated with anesthesia were discussed.  All questions were answered.  She  elected to proceed.  Surgical consent was finalized.   Procedure:   The patient was identified properly. Informed consent was obtained and the surgical site was marked. The patient was taken to the OR where sedation was induced.  The patient was positioned supine.  Prior to making an incision, we attempted a closed reduction of the left distal radius fracture.  Traction was applied.  We obtained prereduction radiographs.  With the assistance of fluoroscopy, I attempted a reduction maneuver.  However, we did incur a multiple skin tears on the dorsum of her hand, as well as the dorsal forearm.  Given the ease with which the skin tears occurred, I did not think that it would be appropriate to continue to manipulate the wrist.  As such, we made the decision to proceed with percutaneous pinning.   The left arm was prepped and draped in the usual sterile fashion.  Timeout was performed before the beginning of the case.  Tourniquet was used for the above duration.  She received 2 g of Ancef prior to proceeding with the pending.  The digit grip was applied to the radial fingers, and 7 pounds of traction was applied.  This improved the alignment.  We then proceeded to manipulate the wrist to improve the overall alignment.  We planned to place 1 pin in retrograde fashion starting at the radial styloid and extending bicortically.  This was secured in place.  Radiographs were evaluated.  Unfortunately, she was still dorsally angulated, with apex volar.  The K wire was subsequently removed so it was in the distal fragment.  Overall alignment was improved, and the K wire was advanced in retrograde fashion to achieve bicortical purchase.  We are satisfied  with the [facility] view.  On the lateral view, there was a lot of dorsal comminution.  I was concerned that the dorsal lip might continue to subside.  I then proceeded to place a single K wire from dorsal to volar to secure the dorsal lip of the distal radius, and this  extended bicortically.  This was evaluated under fluoroscopy, and we were satisfied with the improved alignment.  Pins were bent, and truncated.  Pin caps were placed.  The arm was cleaned.  We then proceeded to place Xeroform on the skin tears, as well as around the pins.  A dry dressing was placed, followed by Webril.  A volar based splint was then applied.   Post-operative plan:  The patient will be NWB on the operative extremity Discharge home from the PACU once they have recovered DVT prophylaxis not indicated in this ambulatory upper extremity patient without significant risk factors.    Pain control with PRN pain medication preferring oral medicines.   Follow up plan will be scheduled in approximately 10 days for incision check and XR.

## 2022-07-03 ENCOUNTER — Encounter (HOSPITAL_COMMUNITY): Payer: Self-pay | Admitting: Orthopedic Surgery

## 2022-07-06 DIAGNOSIS — N390 Urinary tract infection, site not specified: Secondary | ICD-10-CM | POA: Diagnosis not present

## 2022-07-07 ENCOUNTER — Other Ambulatory Visit: Payer: Self-pay | Admitting: Orthopedic Surgery

## 2022-07-07 ENCOUNTER — Telehealth: Payer: Self-pay | Admitting: Orthopedic Surgery

## 2022-07-07 MED ORDER — OXYCODONE HCL 5 MG PO TABS: 5.0000 mg | ORAL_TABLET | Freq: Four times a day (QID) | ORAL | 0 refills | Status: DC | PRN

## 2022-07-07 MED ORDER — OXYCODONE HCL 5 MG PO CAPS: 5.0000 mg | ORAL_CAPSULE | ORAL | 0 refills | Status: DC | PRN

## 2022-07-07 NOTE — Telephone Encounter (Signed)
Tried to call and change rx from capsule to tablet. Told new RX needs to be sent. Please assist.

## 2022-07-07 NOTE — Telephone Encounter (Signed)
Dr. Dallas Schimke pt - pt called, requesting a refill on Oxycodone , 30 quantity, every 4 hours PRN to be sent to [facility].

## 2022-07-07 NOTE — Telephone Encounter (Signed)
Dr. Dallas Schimke pt - spoke w/Selina w/[facility] [phone], she stated that they do not have the Oxycodone in capsule form, they need an script for tablet form.

## 2022-07-10 ENCOUNTER — Encounter: Payer: Self-pay | Admitting: Orthopedic Surgery

## 2022-07-10 ENCOUNTER — Other Ambulatory Visit (INDEPENDENT_AMBULATORY_CARE_PROVIDER_SITE_OTHER): Payer: Medicare Other

## 2022-07-10 ENCOUNTER — Ambulatory Visit (INDEPENDENT_AMBULATORY_CARE_PROVIDER_SITE_OTHER): Payer: Medicare Other | Admitting: Orthopedic Surgery

## 2022-07-10 DIAGNOSIS — S52572A Other intraarticular fracture of lower end of left radius, initial encounter for closed fracture: Secondary | ICD-10-CM

## 2022-07-10 DIAGNOSIS — S52572D Other intraarticular fracture of lower end of left radius, subsequent encounter for closed fracture with routine healing: Secondary | ICD-10-CM

## 2022-07-10 NOTE — Progress Notes (Signed)
Orthopaedic Postop Note  Assessment: Alexis Ortega is a 77 y.o. female s/p attempted closed reduction of a comminuted left distal radius fracture; percutaneous pinning  DOS: 06/29/2022  Plan: Alexis Ortega is recovering appropriately.  Repeat radiographs today demonstrates mild loss of reduction, but much better in comparison to injury films.  Her swelling and bruising has gotten better.  Skin tears appear healthy.  Pins have not backed out.  She was placed into a cast today.  Will plan to keep the pins in place for an additional month or so.  I like to see her back in 2 weeks.  This was discussed with the patient and her husband.  All questions have been answered.  Follow-up: Return in about 2 weeks (around 07/24/2022). XR at next visit: Left wrist  Subjective:  Chief Complaint  Patient presents with   Post-op Follow-up    Left wrist     History of Present Illness: Alexis Ortega is a 77 y.o. female who presents following the above stated procedure.  Procedure was approximately 2 weeks ago.  Initially, we attempted to close reduce the fracture.  However, multiple skin tears were incurred with very little manipulation.  We proceeded with percutaneous pinning in order to improve the overall alignment.  She has done well.  She does continue to have pain.  She is trying to move her fingers.  She is taking pain medications as needed.  No numbness or tingling.  Review of Systems: No fevers or chills No numbness or tingling No Chest Pain No shortness of breath   Objective: There were no vitals taken for this visit.  Physical Exam:  Alert and oriented.  No acute distress.  After removal of the splint, the dorsal base skin tears are healing.  Swelling is getting better.  Minimal bruising.  Sensation intact throughout the left hand.  Proximal skin rash has healed up appropriately.  Pins remain in place.  Minimal swelling around the pin sites.  IMAGING: I personally ordered and reviewed  the following images:   X-rays of the left wrist were obtained in clinic today.  These are compared to prior x-rays.  Pins remain in the same position.  Radial height has been maintained on the AP.  There is a slight interval loss of reduction, with the joint line translating dorsal, and slight dorsal tilt.  No additional injuries.  Impression: Left distal radius fracture with mild loss of reduction, without pin migration.   Oliver Barre, MD 07/10/2022 1:08 PM

## 2022-07-10 NOTE — Patient Instructions (Signed)

## 2022-07-15 ENCOUNTER — Other Ambulatory Visit: Payer: Self-pay | Admitting: Orthopedic Surgery

## 2022-07-15 ENCOUNTER — Telehealth: Payer: Self-pay | Admitting: Orthopedic Surgery

## 2022-07-15 NOTE — Telephone Encounter (Signed)
Dr. Dallas Schimke pt - pt lvm requesting a refill on Oxycodone 5mg , 30 quantity, every 6 hours PRN to be sent to [facility]

## 2022-07-16 DIAGNOSIS — I10 Essential (primary) hypertension: Secondary | ICD-10-CM | POA: Diagnosis not present

## 2022-07-16 DIAGNOSIS — S62102B Fracture of unspecified carpal bone, left wrist, initial encounter for open fracture: Secondary | ICD-10-CM | POA: Diagnosis not present

## 2022-07-24 ENCOUNTER — Encounter: Payer: Self-pay | Admitting: Orthopedic Surgery

## 2022-07-24 ENCOUNTER — Other Ambulatory Visit (INDEPENDENT_AMBULATORY_CARE_PROVIDER_SITE_OTHER): Payer: Medicare Other

## 2022-07-24 ENCOUNTER — Ambulatory Visit (INDEPENDENT_AMBULATORY_CARE_PROVIDER_SITE_OTHER): Payer: Medicare Other | Admitting: Orthopedic Surgery

## 2022-07-24 VITALS — Wt 151.0 lb

## 2022-07-24 DIAGNOSIS — S52572D Other intraarticular fracture of lower end of left radius, subsequent encounter for closed fracture with routine healing: Secondary | ICD-10-CM

## 2022-07-24 MED ORDER — OXYCODONE HCL 5 MG PO TABS: 5.0000 mg | ORAL_TABLET | Freq: Four times a day (QID) | ORAL | 0 refills | Status: DC | PRN

## 2022-07-24 NOTE — Patient Instructions (Signed)

## 2022-07-24 NOTE — Addendum Note (Signed)
Addended by: Thane Edu A on: 07/24/2022 12:33 PM   Modules accepted: Orders

## 2022-07-24 NOTE — Progress Notes (Signed)
Orthopaedic Postop Note  Assessment: Alexis Ortega is a 77 y.o. female s/p attempted closed reduction of a comminuted left distal radius fracture; percutaneous pinning  DOS: 06/29/2022  Plan: Alexis Ortega is doing well.  Her pain is improving gradually.  Radiographs remained stable, without interval displacement compared to the most recent x-ray.  Mild skin irritation at the radial pin site, but no obvious infection.  She has enough pain medicines for now.  She was placed into another cast.  This will remain for an additional 2 weeks.  After that, we will likely remove her pins at the next visit and transition to a brace.  Cast application - Right short arm cast   Verbal consent was obtained and the correct extremity was identified. A well padded, appropriately molded short arm cast was applied to the Right arm Fingers remained warm and well perfused.   There were no sharp edges Patient tolerated the procedure well Cast care instructions were provided    Follow-up: Return in about 2 weeks (around 08/07/2022). XR at next visit: Left wrist  Subjective:  Chief Complaint  Patient presents with   left wrist    06/29/2022   Attempted closed reduction of comminuted left distal radius fracture with percutaneous pinning       History of Present Illness: Alexis Ortega is a 77 y.o. female who presents following the above stated procedure.  Procedure was approximately 4 weeks ago.  She is doing well.  Her pain is slowly improving.  Occasionally, she will take pain medicine as needed.  She is also taking Tylenol and ibuprofen as needed.  No issues with the cast.  She denies fevers or chills.   Review of Systems: No fevers or chills No numbness or tingling No Chest Pain No shortness of breath   Objective: Wt 151 lb (68.5 kg)   BMI 25.92 kg/m   Physical Exam:  Alert and oriented.  No acute distress.  After removal of the splint, the dorsal skin tears are healthy appearing.   Swelling is much better.  Minimal bruising.  Sensation intact throughout the left hand.    Pins remain in place.  Minimal swelling around the pin sites.  Radial pain is a little bit loose, and will be easy to get out.  There is some mild skin irritation around the radial pin site.  IMAGING: I personally ordered and reviewed the following images:   X-rays of the left wrist were obtained in clinic today.  These are compared to prior x-rays.  Pins remain in unchanged position.  Radial height is maintained on the AP projection.  There has been no interval displacement, or additional loss of height on the lateral view.  There is mild dorsal angulation of the joint line, but this remains unchanged.  Impression: Stable left distal radius fracture,  without pin migration.   Oliver Barre, MD 07/24/2022 11:44 AM

## 2022-08-07 ENCOUNTER — Other Ambulatory Visit (INDEPENDENT_AMBULATORY_CARE_PROVIDER_SITE_OTHER): Payer: Medicare Other

## 2022-08-07 ENCOUNTER — Ambulatory Visit (INDEPENDENT_AMBULATORY_CARE_PROVIDER_SITE_OTHER): Payer: Medicare Other | Admitting: Orthopedic Surgery

## 2022-08-07 ENCOUNTER — Encounter: Payer: Self-pay | Admitting: Orthopedic Surgery

## 2022-08-07 DIAGNOSIS — S52572D Other intraarticular fracture of lower end of left radius, subsequent encounter for closed fracture with routine healing: Secondary | ICD-10-CM

## 2022-08-07 MED ORDER — OXYCODONE HCL 5 MG PO TABS: 5.0000 mg | ORAL_TABLET | Freq: Four times a day (QID) | ORAL | 0 refills | Status: DC | PRN

## 2022-08-07 NOTE — Patient Instructions (Addendum)
Focus on range of motion activities only.  We can work on Print production planner later  Brace at all times.  Can remove for hygiene and range of motion activities    Wrist Fracture Rehab Ask your health care provider which exercises are safe for you. Do exercises exactly as told by your health care provider and adjust them as directed. It is normal to feel mild stretching, pulling, tightness, or discomfort as you do these exercises. Stop right away if you feel sudden pain or your pain gets worse. Do not begin these exercises until told by your health care provider.   Stretching and range-of-motion exercises These exercises warm up your muscles and joints and improve the movement and flexibility of your wrist and hand. These exercises also help to relieve pain,numbness, and tingling. Finger flexion and extension Sit or stand with your elbow at your side. Open and stretch your left / right fingers as wide as you can (extension). Hold this position for 10 seconds. Close your left / right fingers into a gentle fist (flexion). Hold this position for 10 seconds. Slowly return to the starting position. Repeat 10 times. Complete this exercise 1-2 times a day. Wrist flexion Bend your left / right elbow to a 90-degree angle (right angle) with your palm facing the floor. Bend your wrist forward so your fingers point toward the floor (flexion). Hold this position for 10 seconds. Slowly return to the starting position. Repeat 10 times. Complete this exercise 1-2 times a day. Wrist extension Bend your left / right elbow to a 90-degree angle (right angle) with your palm facing the floor. Bend your wrist backward so your fingers point toward the ceiling (extension). Hold this position for 10 seconds. Slowly return to the starting position. Repeat 10 times. Complete this exercise 1-2 times a day. Ulnar deviation Bend your left / right elbow to a 90-degree angle (right angle), and rest your forearm on a table  with your palm facing down. Keeping your hand flat on the table, bend your left / right wrist toward your small finger (pinkie). This is ulnar deviation. Hold this position for 10 seconds. Slowly return to the starting position. Repeat 10 times. Complete this exercise 1-2 times a day. Radial deviation Bend your left / right elbow to a 90-degree angle (right angle), and rest your forearm on a table with your palm facing down. Keeping your hand flat on the table, bend your left / right wrist toward your thumb. This is radial deviation. Hold this position for 10 seconds. Slowly return to the starting position. Repeat 10 times. Complete this exercise 1-2 times a day. Forearm rotation, supination Stand or sit with your left / right elbow bent to a 90-degree angle (right angle) at your side. Position your forearm so that the thumb is facing the ceiling (neutral position). Turn (rotate) your palm up toward the ceiling (supination), stopping when you feel a gentle stretch. Hold this position for 10 seconds. Slowly return to the starting position. Repeat 10 times. Complete this exercise 1-2 times a day. Forearm rotation, pronation Stand or sit with your left / right elbow bent to a 90-degree angle (right angle) at your side. Position your forearm so that the thumb is facing the ceiling (neutral position). Turn (rotate) your palm down toward the floor (pronation), stopping when you feel a gentle stretch. Hold this position for 10 seconds. Slowly return to the starting position. Repeat 10 times. Complete this exercise 1-2 times a day. Wrist flexion stretch  Extend  your left / right arm in front of you and turn your palm down toward the floor. If told by your health care provider, bend your left / right arm to a 90-degree angle (right angle) at your side. Using your uninjured hand, gently press over the back of your left / right hand to bend your wrist and fingers toward the floor (flexion). Go as far  as you can to feel a stretch without causing pain. Hold this position for 10 seconds. Slowly return to the starting position. Repeat 10 times. Complete this exercise 1-2 times a day. Wrist extension stretch  Extend your left / right arm in front of you and turn your palm up toward the ceiling. If told by your health care provider, bend your left / right arm to a 90-degree angle (right angle) at your side. Using your uninjured hand, gently press over the palm of your left / right hand to bend your wrist and fingers toward the floor (extension). Go as far as you can to feel a stretch without causing pain. Hold this position for 10 seconds. Slowly return to the starting position. Repeat 10 times. Complete this exercise 1-2 times a day. Forearm rotation stretch, supination Stand or sit with your arms at your sides. Bend your left / right elbow to a 90-degree angle (right angle). Using your uninjured hand, turn your left / right palm up toward the ceiling (assisted supination) until you feel a gentle stretch in the inside of your forearm. Hold this position for 10 seconds. Slowly return to the starting position. Repeat 10 times. Complete this exercise 1-2 times a day. Forearm rotation stretch, pronation Stand or sit with your arms at your sides. Bend your left / right elbow to a 90-degree angle (right angle). Using your uninjured hand, turn your left / right palm down toward the floor (assisted pronation) until you feel a gentle stretch in the top of your forearm. Hold this position for 10 seconds. Slowly return to the starting position. Repeat 10 times. Complete this exercise 1-2 times a day.    Strengthening exercises These exercises build strength and endurance in your wrist and hand. Enduranceis the ability to use your muscles for a long time, even after they get tired. Wrist flexion Sit with your left / right forearm supported on a table. Your elbow should be at waist height. Rest  your hand over the edge of the table, palm up. Gently grasp a 5 lb / kg weight (can of soup). Or, hold an exercise band or tube in both hands, keeping your hands at the same level and hip distance apart. There should be slight tension in the exercise band or tube. Without moving your forearm or elbow, slowly bend your wrist up toward the ceiling (wrist flexion). Hold this position for 10 seconds. Slowly return to the starting position. Repeat 10 times. Complete this exercise 1-2 times a day. Wrist extension Sit with your left / right forearm supported on a table. Your elbow should be at waist height. Rest your hand over the edge of the table, palm down. Gently grasp a 5 lb / kg weight. Or, hold an exercise band or tube in both hands, keeping your hands at the same level and hip distance apart. There should be slight tension in the exercise band or tube. Without moving your forearm or elbow, slowly curl your hand up toward the ceiling (extension). Hold this position for 10 seconds. Slowly return to the starting position. Repeat 10 times.  Complete this exercise 1-2 times a day. Forearm rotation, supination  Sit with your left / right forearm supported on a table. Your elbow should be at waist height. Rest your hand over the edge of the table, palm down. Gently grasp a lightweight hammer near the head. As this exercise gets easier for you, try holding the hammer farther down the handle. Without moving your elbow, slowly turn (rotate) your palm up toward the ceiling (supination). Hold this position for 10 seconds. Slowly return to the starting position. Repeat 10 times. Complete this exercise 1-2 times a day. Forearm rotation, pronation  Sit with your left / right forearm supported on a table. Your elbow should be at waist height. Rest your hand over the edge of the table, palm up. Gently grasp a lightweight hammer near the head. As this exercise gets easier for you, try holding the hammer  farther down the handle. Without moving your elbow, slowly turn (rotate) your palm down toward the floor (pronation). Hold this position for 10 seconds. Slowly return to the starting position. Repeat 10 times. Complete this exercise 1-2 times a day. Grip strengthening  Hold one of these items in your left / right hand: a dense sponge, a stress ball, or a large, rolled sock. Slowly squeeze the object as hard as you can without increasing any pain. Hold your squeeze for 10 seconds. Slowly release your grip. Repeat 10 times. Complete this exercise 1-2 times a day. This information is not intended to replace advice given to you by your health care provider. Make sure you discuss any questions you have with your healthcare provider. Document Revised: 07/13/2019 Document Reviewed: 07/13/2019 Elsevier Patient Education  2022 [employer].

## 2022-08-07 NOTE — Progress Notes (Signed)
Orthopaedic Postop Note  Assessment: Alexis Ortega is a 77 y.o. female s/p attempted closed reduction of a comminuted left distal radius fracture; percutaneous pinning  DOS: 06/29/2022  Plan: Alexis Ortega has tolerated the immobilization well.  She does continue to have some pain, but feels better overall.  Pins were removed in clinic today.  They came out very easily.  Radiographs are stable.  She was transition to a removable wrist brace.  Okay for her to remove the brace to initiate range of motion.  Additional refill of pain medication was provided.  We will work to get her off of the pain medicines.  She will follow-up in approximately 3 weeks for repeat evaluation.  Follow-up: Return in about 19 days (around 08/26/2022). XR at next visit: Left wrist  Subjective:  Chief Complaint  Patient presents with   Post-op Follow-up    Left wrist fracture     History of Present Illness: Alexis Ortega is a 77 y.o. female who presents following the above stated procedure.  Procedure was approximately 6 weeks ago.  He is progressively improving.  She does continue to have pain.  She is taking pain medication still.  No numbness or tingling.  She tolerated the cast without issues.  Review of Systems: No fevers or chills No numbness or tingling No Chest Pain No shortness of breath   Objective: There were no vitals taken for this visit.  Physical Exam:  Alert and oriented.  No acute distress.  After removal of the cast, the skin is healing well.  No drainage.  No erythema.  Pin sites look good.  She tolerates gentle flexion and extension.  She is able to fully extend her fingers.  She has limited finger flexion.  Her wrist is primarily held in flexed position.  Fingers are warm and well-perfused.  Sensation intact throughout the hand.   IMAGING: I personally ordered and reviewed the following images:   X-rays of the left wrist were obtained in clinic today.  These are compared to  prior x-rays.  Pins remain in unchanged position.  Radial height is maintained on the AP projection.  Dorsal angulation of the joint line on the lateral projection.  Unchanged alignment overall.  No interval displacement.  Impression: 1 stable left distal radius fracture, with mild dorsal angulation, without migration of the pins.   Oliver Barre, MD 08/07/2022 11:43 AM

## 2022-08-26 ENCOUNTER — Encounter: Payer: Self-pay | Admitting: Orthopedic Surgery

## 2022-08-26 ENCOUNTER — Other Ambulatory Visit (INDEPENDENT_AMBULATORY_CARE_PROVIDER_SITE_OTHER): Payer: Medicare Other

## 2022-08-26 ENCOUNTER — Ambulatory Visit (INDEPENDENT_AMBULATORY_CARE_PROVIDER_SITE_OTHER): Payer: Medicare Other | Admitting: Orthopedic Surgery

## 2022-08-26 DIAGNOSIS — S52572D Other intraarticular fracture of lower end of left radius, subsequent encounter for closed fracture with routine healing: Secondary | ICD-10-CM

## 2022-08-26 MED ORDER — OXYCODONE HCL 5 MG PO TABS: 5.0000 mg | ORAL_TABLET | Freq: Three times a day (TID) | ORAL | 0 refills | Status: DC

## 2022-08-26 NOTE — Patient Instructions (Addendum)
Ok to use the brace for the next 2 weeks, but you should work to get out of the brace after that.  You should focus on range of motion.  Once you have close to full range of motion, you can initiate strengthening activities.  Okay to start using the left hand to take care of yourself, clean yourself and feed yourself.   Wrist Fracture Rehab Ask your health care provider which exercises are safe for you. Do exercises exactly as told by your health care provider and adjust them as directed. It is normal to feel mild stretching, pulling, tightness, or discomfort as you do these exercises. Stop right away if you feel sudden pain or your pain gets worse. Do not begin these exercises until told by your health care provider. Stretching and range-of-motion exercises These exercises warm up your muscles and joints and improve the movement and flexibility of your wrist and hand. These exercises also help to relieve pain,numbness, and tingling. Finger flexion and extension Sit or stand with your elbow at your side. Open and stretch your left / right fingers as wide as you can (extension). Hold this position for 10 seconds. Close your left / right fingers into a gentle fist (flexion). Hold this position for 10 seconds. Slowly return to the starting position. Repeat 10 times. Complete this exercise 1-2 times a day. Wrist flexion Bend your left / right elbow to a 90-degree angle (right angle) with your palm facing the floor. Bend your wrist forward so your fingers point toward the floor (flexion). Hold this position for 10 seconds. Slowly return to the starting position. Repeat 10 times. Complete this exercise 1-2 times a day. Wrist extension Bend your left / right elbow to a 90-degree angle (right angle) with your palm facing the floor. Bend your wrist backward so your fingers point toward the ceiling (extension). Hold this position for 10 seconds. Slowly return to the starting position. Repeat 10  times. Complete this exercise 1-2 times a day. Ulnar deviation Bend your left / right elbow to a 90-degree angle (right angle), and rest your forearm on a table with your palm facing down. Keeping your hand flat on the table, bend your left / right wrist toward your small finger (pinkie). This is ulnar deviation. Hold this position for 10 seconds. Slowly return to the starting position. Repeat 10 times. Complete this exercise 1-2 times a day. Radial deviation Bend your left / right elbow to a 90-degree angle (right angle), and rest your forearm on a table with your palm facing down. Keeping your hand flat on the table, bend your left / right wrist toward your thumb. This is radial deviation. Hold this position for 10 seconds. Slowly return to the starting position. Repeat 10 times. Complete this exercise 1-2 times a day. Forearm rotation, supination Stand or sit with your left / right elbow bent to a 90-degree angle (right angle) at your side. Position your forearm so that the thumb is facing the ceiling (neutral position). Turn (rotate) your palm up toward the ceiling (supination), stopping when you feel a gentle stretch. Hold this position for 10 seconds. Slowly return to the starting position. Repeat 10 times. Complete this exercise 1-2 times a day. Forearm rotation, pronation Stand or sit with your left / right elbow bent to a 90-degree angle (right angle) at your side. Position your forearm so that the thumb is facing the ceiling (neutral position). Turn (rotate) your palm down toward the floor (pronation), stopping when you  feel a gentle stretch. Hold this position for 10 seconds. Slowly return to the starting position. Repeat 10 times. Complete this exercise 1-2 times a day. Wrist flexion stretch  Extend your left / right arm in front of you and turn your palm down toward the floor. If told by your health care provider, bend your left / right arm to a 90-degree angle (right angle)  at your side. Using your uninjured hand, gently press over the back of your left / right hand to bend your wrist and fingers toward the floor (flexion). Go as far as you can to feel a stretch without causing pain. Hold this position for 10 seconds. Slowly return to the starting position. Repeat 10 times. Complete this exercise 1-2 times a day. Wrist extension stretch  Extend your left / right arm in front of you and turn your palm up toward the ceiling. If told by your health care provider, bend your left / right arm to a 90-degree angle (right angle) at your side. Using your uninjured hand, gently press over the palm of your left / right hand to bend your wrist and fingers toward the floor (extension). Go as far as you can to feel a stretch without causing pain. Hold this position for 10 seconds. Slowly return to the starting position. Repeat 10 times. Complete this exercise 1-2 times a day. Forearm rotation stretch, supination Stand or sit with your arms at your sides. Bend your left / right elbow to a 90-degree angle (right angle). Using your uninjured hand, turn your left / right palm up toward the ceiling (assisted supination) until you feel a gentle stretch in the inside of your forearm. Hold this position for 10 seconds. Slowly return to the starting position. Repeat 10 times. Complete this exercise 1-2 times a day. Forearm rotation stretch, pronation Stand or sit with your arms at your sides. Bend your left / right elbow to a 90-degree angle (right angle). Using your uninjured hand, turn your left / right palm down toward the floor (assisted pronation) until you feel a gentle stretch in the top of your forearm. Hold this position for 10 seconds. Slowly return to the starting position. Repeat 10 times. Complete this exercise 1-2 times a day. Strengthening exercises These exercises build strength and endurance in your wrist and hand. Enduranceis the ability to use your muscles for a  long time, even after they get tired. Wrist flexion Sit with your left / right forearm supported on a table. Your elbow should be at waist height. Rest your hand over the edge of the table, palm up. Gently grasp a 5 lb / kg weight (can of soup). Or, hold an exercise band or tube in both hands, keeping your hands at the same level and hip distance apart. There should be slight tension in the exercise band or tube. Without moving your forearm or elbow, slowly bend your wrist up toward the ceiling (wrist flexion). Hold this position for 10 seconds. Slowly return to the starting position. Repeat 10 times. Complete this exercise 1-2 times a day. Wrist extension Sit with your left / right forearm supported on a table. Your elbow should be at waist height. Rest your hand over the edge of the table, palm down. Gently grasp a 5 lb / kg weight. Or, hold an exercise band or tube in both hands, keeping your hands at the same level and hip distance apart. There should be slight tension in the exercise band or tube. Without moving  your forearm or elbow, slowly curl your hand up toward the ceiling (extension). Hold this position for 10 seconds. Slowly return to the starting position. Repeat 10 times. Complete this exercise 1-2 times a day. Forearm rotation, supination  Sit with your left / right forearm supported on a table. Your elbow should be at waist height. Rest your hand over the edge of the table, palm down. Gently grasp a lightweight hammer near the head. As this exercise gets easier for you, try holding the hammer farther down the handle. Without moving your elbow, slowly turn (rotate) your palm up toward the ceiling (supination). Hold this position for 10 seconds. Slowly return to the starting position. Repeat 10 times. Complete this exercise 1-2 times a day. Forearm rotation, pronation  Sit with your left / right forearm supported on a table. Your elbow should be at waist height. Rest your  hand over the edge of the table, palm up. Gently grasp a lightweight hammer near the head. As this exercise gets easier for you, try holding the hammer farther down the handle. Without moving your elbow, slowly turn (rotate) your palm down toward the floor (pronation). Hold this position for 10 seconds. Slowly return to the starting position. Repeat 10 times. Complete this exercise 1-2 times a day. Grip strengthening  Hold one of these items in your left / right hand: a dense sponge, a stress ball, or a large, rolled sock. Slowly squeeze the object as hard as you can without increasing any pain. Hold your squeeze for 10 seconds. Slowly release your grip. Repeat 10 times. Complete this exercise 1-2 times a day. This information is not intended to replace advice given to you by your health care provider. Make sure you discuss any questions you have with your healthcare provider. Document Revised: 07/13/2019 Document Reviewed: 07/13/2019 Elsevier Patient Education  2022 [employer].

## 2022-08-26 NOTE — Progress Notes (Signed)
Orthopaedic Postop Note  Assessment: Alexis Ortega is a 77 y.o. female s/p attempted closed reduction of a comminuted left distal radius fracture; percutaneous pinning  DOS: 06/29/2022  Plan: Alexis Ortega is improving.  She has started working on range of motion.  She continues to use her brace.  Radiographs demonstrate stable overall alignment.  There is been no subsidence since the pins were removed.  She continues to have occasional pains.  She is going to the beach for the next 2 weeks, and I provided her with 1 more prescription for oxycodone, to be taken as needed.  She also discussed back issues since the fall.  She has a history of multiple back and neck surgeries.  I have provided her with information regarding Dr. Ophelia Charter and Dr. Christell Constant.  She will let us know how she wishes to proceed.  Over the next 2 weeks, I want her to work out of the brace.  In 2 weeks time, I want her to stop using the brace.  She should start working on range of motion activities, and I provided her with simple exercises today.  Once she has achieved near full range of motion, she can initiate strengthening.  I will see her back in 1 month.  Follow-up: Return in about 4 weeks (around 09/23/2022). XR at next visit: Left wrist  Subjective:  Chief Complaint  Patient presents with   Routine Post Op    L wrist DOS 06/29/22    History of Present Illness: Alexis Ortega is a 77 y.o. female who presents following the above stated procedure.  Surgery was approximately 2 months ago.  She continues to improve.  She has been taking the brace off to do some range of motion activities.  She is starting use her left hand more.  She does continue to have some pain.  She does continue to rely on the brace most of the time.  She feels better overall.  No numbness or tingling.  She is taking medications occasionally.  Review of Systems: No fevers or chills No numbness or tingling No Chest Pain No shortness of  breath   Objective: There were no vitals taken for this visit.  Physical Exam:  Alert and oriented.  No acute distress.  Skin tears of the left wrist have healed.  No drainage.  No erythema.  The pin sites look good.  She tolerates approximately 25 degrees of extension, as well as 25 degrees of flexion.  She can almost make a full fist.  She is lacking some pronation and supination.  Fingers are warm and well-perfused.   IMAGING: I personally ordered and reviewed the following images:   X-rays of the left wrist were obtained in clinic today.  These are compared to prior x-rays.  There has been no interval displacement after removal of the pins.  There is mild dorsal angulation of the joint line, on the lateral projection.  There is evidence of callus formation.  No new injuries.  No bony lesions.  Impression: Stable left distal radius fracture, with mild dorsal angulation of the joint line  Oliver Barre, MD 08/26/2022 10:03 AM

## 2022-09-22 ENCOUNTER — Encounter (INDEPENDENT_AMBULATORY_CARE_PROVIDER_SITE_OTHER): Payer: Self-pay | Admitting: Gastroenterology

## 2022-09-22 ENCOUNTER — Ambulatory Visit (INDEPENDENT_AMBULATORY_CARE_PROVIDER_SITE_OTHER): Payer: Medicare Other | Admitting: Gastroenterology

## 2022-09-22 VITALS — BP 113/56 | HR 60 | Temp 98.1°F | Ht 64.0 in | Wt 152.4 lb

## 2022-09-22 DIAGNOSIS — K5900 Constipation, unspecified: Secondary | ICD-10-CM

## 2022-09-22 DIAGNOSIS — Z86010 Personal history of colonic polyps: Secondary | ICD-10-CM

## 2022-09-22 NOTE — Patient Instructions (Signed)
I will get your records from [facility] to confirm when you need a repeat colonoscopy, I will be in touch once I have reviewed these  Increase water intake, aim for atleast 64 oz per day Increase fruits, veggies and whole grains, kiwi and prunes are especially good for constipation For your constipation, please Start taking Miralax 1 capful every day for one week. If bowel movements do not improve, increase to 1 capful every 12 hours. If after two weeks there is no improvement, increase to 1 capful every 8 hours  If you are not having good results with miralax, please let me know as you may require a stronger constipation therapy given you are on chronic pain medications  Follow up 3 months   It was a pleasure to see you today. I want to create trusting relationships with patients and provide genuine, compassionate, and quality care. I truly value your feedback! please be on the lookout for a survey regarding your visit with me today. I appreciate your input about our visit and your time in completing this!    Gabreille Dardis L. Jeanmarie Hubert, MSN, APRN, AGNP-C Adult-Gerontology Nurse Practitioner [facility] Gastroenterology at [facility]

## 2022-09-22 NOTE — Progress Notes (Addendum)
Referring Provider: Toma Deiters, MD Primary Care Physician:  Toma Deiters, MD Primary GI Physician: Levon Hedger   Chief Complaint  Patient presents with   Colon Cancer Screening    Patient states she had colonoscopy 7 years ago and states she was suppose to repeat one year later.    HPI:   Alexis Ortega is a 77 y.o. female with past medical history of depression, FBM, hypothyroidism, HTN, multiple back surgeries, and history of Schatzki's ring   Patient presenting to discuss repeat Colonoscopy and constipation.   Patient reports that she is unsure when her last colonoscopy was but thinks it was 2016. She was told then to have a repeat in 1 year. Per chart review, appears last was 2019 with polyps and hemorrhoids, per Dr. Carola Frost note but no report available, unclear how many or what size polyps she had.   She notes she is on suboxone for chronic pain she just started this back yesterday, she notes she has taken this before and was constipated secondary to suboxone. She eating yogurt and doing metamucil but unsure yet if this is working since she just restarted her suboxone. She reports some bleeding after having significant constipation and doing enemas about 1 month ago, notes history of hemorrhoids.  Denies melena. Weight appears stable. Appetite is good. She has some nausea secondary to taking suboxone but feels the yogurt and zofran keeps this controlled. She reports good water intake.    Last Colonoscopy:12/2017-dr cathey-hemorrhoids/polyps? Last Endoscopy: 2022  - Non-obstructing Schatzki ring. Dilated.                           - 1 cm hiatal hernia.                           - Nodular mucosa in the gastric body. Biopsied.                           - Normal examined duodenum.  Recommendations:    Past Medical History:  Diagnosis Date   Arthritis    Asthma    Depression    Fibromyalgia    GERD (gastroesophageal reflux disease)    Hypertension    Hypothyroidism     Thyroid disease     Past Surgical History:  Procedure Laterality Date   ABDOMINAL HYSTERECTOMY     BACK SURGERY     BIOPSY  03/29/2020   Procedure: BIOPSY;  Surgeon: Dolores Frame, MD;  Location: [facility];  Service: Gastroenterology;;   CARPAL TUNNEL RELEASE Right    CHOLECYSTECTOMY     CLOSED REDUCTION RADIAL SHAFT Left 06/29/2022   Procedure: ATTTEMPTED CLOSED REDUCTION RADIAL SHAFT;  Surgeon: Oliver Barre, MD;  Location: [facility] OR[facility];  Service: Orthopedics;  Laterality: Left;  Closed reduction left distal radius fracture   ESOPHAGEAL DILATION N/A 03/29/2020   Procedure: ESOPHAGEAL DILATION;  Surgeon: Dolores Frame, MD;  Location: [facility];  Service: Gastroenterology;  Laterality: N/A;   ESOPHAGOGASTRODUODENOSCOPY (EGD) WITH PROPOFOL N/A 03/29/2020   Procedure: ESOPHAGOGASTRODUODENOSCOPY (EGD) WITH PROPOFOL;  Surgeon: Dolores Frame, MD;  Location: [facility];  Service: Gastroenterology;  Laterality: N/A;  9:00   EYE SURGERY     cataract with lens implant   KNEE ARTHROSCOPY Left    LEFT HEART CATHETERIZATION WITH CORONARY ANGIOGRAM N/A 10/02/2013   Procedure: LEFT HEART CATHETERIZATION  WITH CORONARY ANGIOGRAM;  Surgeon: Runell Gess, MD;  Location: [facility] CATH LAB;  Service: Cardiovascular;  Laterality: N/A;   NECK SURGERY     PERCUTANEOUS PINNING Left 06/29/2022   Procedure: PERCUTANEOUS PINNING OF DISTAL RADIUS;  Surgeon: Oliver Barre, MD;  Location: [facility];  Service: Orthopedics;  Laterality: Left;  Closed reduction and pinning of distal radius fracture   TOTAL KNEE ARTHROPLASTY Left 06/01/2012   Procedure: LEFT TOTAL KNEE ARTHROPLASTY;  Surgeon: Loreta Ave, MD;  Location: [facility] OR;  Service: Orthopedics;  Laterality: Left;   TUBAL LIGATION      Current Outpatient Medications  Medication Sig Dispense Refill   acetaminophen (TYLENOL) 500 MG tablet Take 500 mg by mouth every 6 (six) hours as needed for moderate pain or mild pain.      ALPRAZolam (XANAX) 0.5 MG tablet Take 0.5 mg by mouth at bedtime.     amLODipine (NORVASC) 10 MG tablet Take 10 mg by mouth daily.     buprenorphine-naloxone (SUBOXONE) 2-0.5 mg SUBL SL tablet Place under the tongue daily. One daily     citalopram (CELEXA) 20 MG tablet Take 20 mg by mouth daily.     levothyroxine (SYNTHROID) 100 MCG tablet Take 100 mcg by mouth daily before breakfast.      losartan (COZAAR) 100 MG tablet Take 100 mg by mouth daily.     Multiple Vitamin (MULTIVITAMIN WITH MINERALS) TABS tablet Take 1 tablet by mouth daily.     ondansetron (ZOFRAN ODT) 4 MG disintegrating tablet 4mg  ODT q4 hours prn nausea/vomit (Patient taking differently: Take 4 mg by mouth every 4 (four) hours as needed for nausea or vomiting. 4mg  ODT q4 hours prn nausea/vomit) 12 tablet 0   PROAIR HFA 108 (90 BASE) MCG/ACT inhaler Inhale 2 puffs into the lungs every 4 (four) hours as needed for wheezing.      Zinc 50 MG CAPS Take 50 mg by mouth daily.     No current facility-administered medications for this visit.    Allergies as of 09/22/2022 - Review Complete 09/22/2022  Allergen Reaction Noted   Diclofenac Rash    Tramadol Itching 10/12/2011    Family History  Problem Relation Age of Onset   Allergies Son    Asthma Daughter     Social History   Socioeconomic History   Marital status: Married    Spouse name: Not on file   Number of children: Not on file   Years of education: Not on file   Highest education level: Not on file  Occupational History   Not on file  Tobacco Use   Smoking status: Former    Packs/day: 0.50    Years: 40.00    Additional pack years: 0.00    Total pack years: 20.00    Types: Cigarettes    Quit date: 06/14/2012    Years since quitting: 10.2    Passive exposure: Past   Smokeless tobacco: Never  Substance and Sexual Activity   Alcohol use: No   Drug use: Not Currently    Comment: fentanyl patch Q three days   Sexual activity: Not on file  Other Topics  Concern   Not on file  Social History Narrative   Not on file   Social Determinants of Health   Financial Resource Strain: Not on file  Food Insecurity: Not on file  Transportation Needs: Not on file  Physical Activity: Not on file  Stress: Not on file  Social Connections: Not on file  Review of systems General: negative for malaise, night sweats, fever, chills, weight loss Neck: Negative for lumps, goiter, pain and significant neck swelling Resp: Negative for cough, wheezing, dyspnea at rest CV: Negative for chest pain, leg swelling, palpitations, orthopnea GI: denies melena, nausea, vomiting, diarrhea, dysphagia, odyonophagia, early satiety or unintentional weight loss. +constipation +intermittent rectal bleeding/hemorrhoids  MSK: Negative for joint pain or swelling, back pain, and muscle pain. Derm: Negative for itching or rash Psych: Denies depression, anxiety, memory loss, confusion. No homicidal or suicidal ideation.  Heme: Negative for prolonged bleeding, bruising easily, and swollen nodes. Endocrine: Negative for cold or heat intolerance, polyuria, polydipsia and goiter. Neuro: negative for tremor, gait imbalance, syncope and seizures. The remainder of the review of systems is noncontributory.  Physical Exam: BP (!) 113/56 (BP Location: Right Arm, Patient Position: Sitting, Cuff Size: Large)   Pulse 60   Temp 98.1 F (36.7 C) (Oral)   Ht 5\' 4"  (1.626 m)   Wt 152 lb 6.4 oz (69.1 kg)   BMI 26.16 kg/m  General:   Alert and oriented. No distress noted. Pleasant and cooperative.  Head:  Normocephalic and atraumatic. Eyes:  Conjuctiva clear without scleral icterus. Mouth:  Oral mucosa pink and moist. Good dentition. No lesions. Heart: Normal rate and rhythm, s1 and s2 heart sounds present.  Lungs: Clear lung sounds in all lobes. Respirations equal and unlabored. Abdomen:  +BS, soft, non-tender and non-distended. No rebound or guarding. No HSM or masses noted. Derm: No  palmar erythema or jaundice Msk:  Symmetrical without gross deformities. Normal posture. Extremities:  Without edema. Neurologic:  Alert and  oriented x4 Psych:  Alert and cooperative. Normal mood and affect.  Invalid input(s): "6 MONTHS"   ASSESSMENT: Alexis Ortega is a 77 y.o. female presenting today to schedule colonoscopy and for constipation  History of colon polyps: Patient with history of colon polyps, unclear what type or size.  Per chart review appears she may have had a colonoscopy in October 2019 though there is no report from this procedure.  Patient is unclear when her last colonoscopy was but thinks she was told to repeat 1 year later.  As her colonoscopy/polyp history is unclear I will attempt to obtain records from [facility] to determine exactly when she needs to have repeat colonoscopy and will be in touch with her once I have reviewed these records.  Constipation: Patient having constipation secondary to Suboxone use for chronic pain.  She notes she had previously been on Suboxone and had significant constipation about a month ago where she had to do laxatives and enemas to induce bowel movement.  She had some flare of her hemorrhoids with some rectal bleeding at that time as well.  She had stopped Suboxone and just recently restarted it a few days ago, she is doing Metamucil and yogurt currently, has not had a bowel movement yet.  Given her significant history of constipation secondary to Suboxone, I would recommend stopping Metamucil and starting MiraLAX.  If MiraLAX is not providing good results we may consider an agent such as Movantik given she is on chronic opiates. Should Increase water intake, aim for atleast 64 oz per day Increase fruits, veggies and whole grains, kiwi and prunes are especially good for constipation.   Hemorrhoids: history of hemorrhoids, per notes, appears she had hemorrhoids on previous colonoscopy exams. Notes these flare up some especially with constipation  secondary to suboxone use. She interested in hemorrhoid banding if possible, after her updated colonoscopy, I  briefly discussed the procedure with her and will provide a brochure for her to look over.   PLAN:  Will get records from [facility] to determine timing of next colonoscopy 2.  Continue with good water intake, aim for 64oz/day 3. Start taking Miralax 1 capful every day for one week. If bowel movements do not improve, increase to 1 capful every 12 hours. If after two weeks there is no improvement, increase to 1 capful every 8 hours 4. Consider movantik if miralax not providing results 5. Consider hemorrhoid banding  All questions were answered, patient verbalized understanding and is in agreement with plan as outlined above.   Follow Up: 3 months   Trejon Duford L. Jeanmarie Hubert, MSN, APRN, AGNP-C Adult-Gerontology Nurse Practitioner [facility]  I have reviewed the note and agree with the APP's assessment as described in this progress note  Katrinka Blazing, MD Gastroenterology and Hepatology [facility] Gastroenterology

## 2022-09-23 ENCOUNTER — Ambulatory Visit (INDEPENDENT_AMBULATORY_CARE_PROVIDER_SITE_OTHER): Payer: Medicare Other | Admitting: Orthopedic Surgery

## 2022-09-23 ENCOUNTER — Telehealth: Payer: Self-pay | Admitting: Orthopedic Surgery

## 2022-09-23 ENCOUNTER — Other Ambulatory Visit (INDEPENDENT_AMBULATORY_CARE_PROVIDER_SITE_OTHER): Payer: Medicare Other

## 2022-09-23 ENCOUNTER — Telehealth (INDEPENDENT_AMBULATORY_CARE_PROVIDER_SITE_OTHER): Payer: Self-pay | Admitting: *Deleted

## 2022-09-23 ENCOUNTER — Encounter: Payer: Self-pay | Admitting: Orthopedic Surgery

## 2022-09-23 DIAGNOSIS — S52572D Other intraarticular fracture of lower end of left radius, subsequent encounter for closed fracture with routine healing: Secondary | ICD-10-CM

## 2022-09-23 DIAGNOSIS — M545 Low back pain, unspecified: Secondary | ICD-10-CM

## 2022-09-23 NOTE — Telephone Encounter (Signed)
-----   Message from Raquel James, NP sent at 09/23/2022 12:52 PM EDT ----- Please let the patient know I reviewed records, her last colonoscopy was in 2019 with no polyps found. She had polyps in 2016 that were benign. She would not be due for another colonoscopy until 2029 for screening purposes.  ----- Message ----- From: Simone Curia Sent: 09/22/2022   4:37 PM EDT To: Raquel James, NP

## 2022-09-23 NOTE — Progress Notes (Signed)
Orthopaedic Postop Note  Assessment: Alexis Ortega is a 77 y.o. female s/p attempted closed reduction of a comminuted left distal radius fracture; percutaneous pinning  DOS: 06/29/2022  Plan: Mrs. Tissue is improving.  Her pain is stable.  She is complaining more pain in her back.  She continues to use the brace.  I stressed the importance of weaning off the brace.  Provided her with home exercises to initiate.  She needs to focus on range of motion and then advance her strengthening.  She can use the left wrist and hand is much as tolerated.  Radiographs are stable.  She can follow-up with me regarding her left wrist as needed.  She also continues to have back pain, and has a history of multiple back surgeries.  We provided her with a referral to see Dr. Christell Constant for further evaluation.   Follow-up: Return if symptoms worsen or fail to improve. XR at next visit: Left wrist  Subjective:  Chief Complaint  Patient presents with   Routine Post Op    L wrist DOS 06/29/22    History of Present Illness: Alexis Ortega is a 77 y.o. female who presents following the above stated procedure.  Surgery was approximately 3 months ago.  She is getting better.  She continues to use her brace.  She is concerned about falling again.  She was reluctant to use her left wrist more, she does not want to reinjure it.  She states that she recently hit her left wrist, and has a superficial abrasion.  She has been continue with local wound care.  Once again, she is complaining of pain in her back.  She has a history of multiple surgeries on her lower back.  Most recent surgery was in 2016.   Review of Systems: No fevers or chills No numbness or tingling No Chest Pain No shortness of breath   Objective: There were no vitals taken for this visit.  Physical Exam:  Alert and oriented.  No acute distress.  She has a superficial skin tear on the dorsal aspect of the left hand.  This is healing appropriately.   No surrounding redness.  She is able to make a full fist.  She is lacking grip strength.  30 degrees of extension.  30 degrees of flexion.  Tenderness to palpation on both the radial and ulnar side of the wrist.  Fingers are warm and well-perfused.  IMAGING: I personally ordered and reviewed the following images:   X-rays left wrist were obtained in clinic today.  These are.  Prior x-rays.  There has been no change in the overall alignment.  Distal radius has been impacted, with dorsal angulation of the joint line.  She is slightly ulnar positive.  There has been interval consolidation.  No new injuries.  Impression: Stable left distal radius fracture, with impaction and dorsal tilt of the joint line.  Oliver Barre, MD 09/23/2022 10:30 AM

## 2022-09-23 NOTE — Addendum Note (Signed)
Addended by: Baird Kay on: 09/23/2022 10:41 AM   Modules accepted: Orders

## 2022-09-23 NOTE — Telephone Encounter (Addendum)
-  copied message below from cc chart so I could document on:   ---- Message from Raquel James, NP sent at 09/23/2022 12:52 PM EDT ----- Please let the patient know I reviewed records, her last colonoscopy was in 2019 with no polyps found. She had polyps in 2016 that were benign. She would not be due for another colonoscopy until 2029 for screening purposes.   Discussed with patient and she verbalized understanding.   Ann, please add to reminder file pt due for screening colonoscopy October 2029. Last tcs 12/23/17 scanned in under media.

## 2022-09-23 NOTE — Telephone Encounter (Signed)
Mailed reminder letter to patient today 

## 2022-09-23 NOTE — Patient Instructions (Signed)
We will send a referral to see Dr. Christell Constant in [place].  Please contact clinic any questions or concerns regarding her left wrist.    Wrist Fracture Rehab Ask your health care provider which exercises are safe for you. Do exercises exactly as told by your health care provider and adjust them as directed. It is normal to feel mild stretching, pulling, tightness, or discomfort as you do these exercises. Stop right away if you feel sudden pain or your pain gets worse. Do not begin these exercises until told by your health care provider. Stretching and range-of-motion exercises These exercises warm up your muscles and joints and improve the movement and flexibility of your wrist and hand. These exercises also help to relieve pain,numbness, and tingling. Finger flexion and extension Sit or stand with your elbow at your side. Open and stretch your left / right fingers as wide as you can (extension). Hold this position for 10 seconds. Close your left / right fingers into a gentle fist (flexion). Hold this position for 10 seconds. Slowly return to the starting position. Repeat 10 times. Complete this exercise 1-2 times a day. Wrist flexion Bend your left / right elbow to a 90-degree angle (right angle) with your palm facing the floor. Bend your wrist forward so your fingers point toward the floor (flexion). Hold this position for 10 seconds. Slowly return to the starting position. Repeat 10 times. Complete this exercise 1-2 times a day. Wrist extension Bend your left / right elbow to a 90-degree angle (right angle) with your palm facing the floor. Bend your wrist backward so your fingers point toward the ceiling (extension). Hold this position for 10 seconds. Slowly return to the starting position. Repeat 10 times. Complete this exercise 1-2 times a day. Ulnar deviation Bend your left / right elbow to a 90-degree angle (right angle), and rest your forearm on a table with your palm facing  down. Keeping your hand flat on the table, bend your left / right wrist toward your small finger (pinkie). This is ulnar deviation. Hold this position for 10 seconds. Slowly return to the starting position. Repeat 10 times. Complete this exercise 1-2 times a day. Radial deviation Bend your left / right elbow to a 90-degree angle (right angle), and rest your forearm on a table with your palm facing down. Keeping your hand flat on the table, bend your left / right wrist toward your thumb. This is radial deviation. Hold this position for 10 seconds. Slowly return to the starting position. Repeat 10 times. Complete this exercise 1-2 times a day. Forearm rotation, supination Stand or sit with your left / right elbow bent to a 90-degree angle (right angle) at your side. Position your forearm so that the thumb is facing the ceiling (neutral position). Turn (rotate) your palm up toward the ceiling (supination), stopping when you feel a gentle stretch. Hold this position for 10 seconds. Slowly return to the starting position. Repeat 10 times. Complete this exercise 1-2 times a day. Forearm rotation, pronation Stand or sit with your left / right elbow bent to a 90-degree angle (right angle) at your side. Position your forearm so that the thumb is facing the ceiling (neutral position). Turn (rotate) your palm down toward the floor (pronation), stopping when you feel a gentle stretch. Hold this position for 10 seconds. Slowly return to the starting position. Repeat 10 times. Complete this exercise 1-2 times a day. Wrist flexion stretch  Extend your left / right arm in front of  you and turn your palm down toward the floor. If told by your health care provider, bend your left / right arm to a 90-degree angle (right angle) at your side. Using your uninjured hand, gently press over the back of your left / right hand to bend your wrist and fingers toward the floor (flexion). Go as far as you can to feel a  stretch without causing pain. Hold this position for 10 seconds. Slowly return to the starting position. Repeat 10 times. Complete this exercise 1-2 times a day. Wrist extension stretch  Extend your left / right arm in front of you and turn your palm up toward the ceiling. If told by your health care provider, bend your left / right arm to a 90-degree angle (right angle) at your side. Using your uninjured hand, gently press over the palm of your left / right hand to bend your wrist and fingers toward the floor (extension). Go as far as you can to feel a stretch without causing pain. Hold this position for 10 seconds. Slowly return to the starting position. Repeat 10 times. Complete this exercise 1-2 times a day. Forearm rotation stretch, supination Stand or sit with your arms at your sides. Bend your left / right elbow to a 90-degree angle (right angle). Using your uninjured hand, turn your left / right palm up toward the ceiling (assisted supination) until you feel a gentle stretch in the inside of your forearm. Hold this position for 10 seconds. Slowly return to the starting position. Repeat 10 times. Complete this exercise 1-2 times a day. Forearm rotation stretch, pronation Stand or sit with your arms at your sides. Bend your left / right elbow to a 90-degree angle (right angle). Using your uninjured hand, turn your left / right palm down toward the floor (assisted pronation) until you feel a gentle stretch in the top of your forearm. Hold this position for 10 seconds. Slowly return to the starting position. Repeat 10 times. Complete this exercise 1-2 times a day. Strengthening exercises These exercises build strength and endurance in your wrist and hand. Enduranceis the ability to use your muscles for a long time, even after they get tired. Wrist flexion Sit with your left / right forearm supported on a table. Your elbow should be at waist height. Rest your hand over the edge of  the table, palm up. Gently grasp a 5 lb / kg weight (can of soup). Or, hold an exercise band or tube in both hands, keeping your hands at the same level and hip distance apart. There should be slight tension in the exercise band or tube. Without moving your forearm or elbow, slowly bend your wrist up toward the ceiling (wrist flexion). Hold this position for 10 seconds. Slowly return to the starting position. Repeat 10 times. Complete this exercise 1-2 times a day. Wrist extension Sit with your left / right forearm supported on a table. Your elbow should be at waist height. Rest your hand over the edge of the table, palm down. Gently grasp a 5 lb / kg weight. Or, hold an exercise band or tube in both hands, keeping your hands at the same level and hip distance apart. There should be slight tension in the exercise band or tube. Without moving your forearm or elbow, slowly curl your hand up toward the ceiling (extension). Hold this position for 10 seconds. Slowly return to the starting position. Repeat 10 times. Complete this exercise 1-2 times a day. Forearm rotation, supination  Sit with your left / right forearm supported on a table. Your elbow should be at waist height. Rest your hand over the edge of the table, palm down. Gently grasp a lightweight hammer near the head. As this exercise gets easier for you, try holding the hammer farther down the handle. Without moving your elbow, slowly turn (rotate) your palm up toward the ceiling (supination). Hold this position for 10 seconds. Slowly return to the starting position. Repeat 10 times. Complete this exercise 1-2 times a day. Forearm rotation, pronation  Sit with your left / right forearm supported on a table. Your elbow should be at waist height. Rest your hand over the edge of the table, palm up. Gently grasp a lightweight hammer near the head. As this exercise gets easier for you, try holding the hammer farther down the  handle. Without moving your elbow, slowly turn (rotate) your palm down toward the floor (pronation). Hold this position for 10 seconds. Slowly return to the starting position. Repeat 10 times. Complete this exercise 1-2 times a day. Grip strengthening  Hold one of these items in your left / right hand: a dense sponge, a stress ball, or a large, rolled sock. Slowly squeeze the object as hard as you can without increasing any pain. Hold your squeeze for 10 seconds. Slowly release your grip. Repeat 10 times. Complete this exercise 1-2 times a day. This information is not intended to replace advice given to you by your health care provider. Make sure you discuss any questions you have with your healthcare provider. Document Revised: 07/13/2019 Document Reviewed: 07/13/2019 Elsevier Patient Education  2022 [employer].

## 2022-09-23 NOTE — Telephone Encounter (Signed)
TCS noted in recall for 12/2027

## 2022-10-05 NOTE — Telephone Encounter (Signed)
Patient called in to cx her upcoming appt.Marland Kitchen

## 2022-10-07 ENCOUNTER — Other Ambulatory Visit (INDEPENDENT_AMBULATORY_CARE_PROVIDER_SITE_OTHER): Payer: Medicare Other

## 2022-10-07 ENCOUNTER — Ambulatory Visit (INDEPENDENT_AMBULATORY_CARE_PROVIDER_SITE_OTHER): Payer: Medicare Other | Admitting: Orthopaedic Surgery

## 2022-10-07 ENCOUNTER — Encounter: Payer: Self-pay | Admitting: Orthopaedic Surgery

## 2022-10-07 VITALS — Ht 64.0 in | Wt 152.0 lb

## 2022-10-07 DIAGNOSIS — G8929 Other chronic pain: Secondary | ICD-10-CM

## 2022-10-07 DIAGNOSIS — M545 Low back pain, unspecified: Secondary | ICD-10-CM | POA: Diagnosis not present

## 2022-10-07 DIAGNOSIS — M7061 Trochanteric bursitis, right hip: Secondary | ICD-10-CM | POA: Diagnosis not present

## 2022-10-07 DIAGNOSIS — M549 Dorsalgia, unspecified: Secondary | ICD-10-CM

## 2022-10-07 DIAGNOSIS — S62102D Fracture of unspecified carpal bone, left wrist, subsequent encounter for fracture with routine healing: Secondary | ICD-10-CM | POA: Diagnosis not present

## 2022-10-07 DIAGNOSIS — M542 Cervicalgia: Secondary | ICD-10-CM | POA: Diagnosis not present

## 2022-10-07 DIAGNOSIS — M5459 Other low back pain: Secondary | ICD-10-CM | POA: Diagnosis not present

## 2022-10-07 DIAGNOSIS — I10 Essential (primary) hypertension: Secondary | ICD-10-CM | POA: Diagnosis not present

## 2022-10-07 MED ORDER — LIDOCAINE HCL 1 % IJ SOLN
1.0000 mL | INTRAMUSCULAR | Status: AC | PRN
Start: 2022-10-07 — End: 2022-10-07
  Administered 2022-10-07: 1 mL

## 2022-10-07 MED ORDER — METHYLPREDNISOLONE ACETATE 40 MG/ML IJ SUSP
40.0000 mg | INTRAMUSCULAR | Status: AC | PRN
Start: 2022-10-07 — End: 2022-10-07
  Administered 2022-10-07: 40 mg via INTRA_ARTICULAR

## 2022-10-07 MED ORDER — BUPIVACAINE HCL 0.25 % IJ SOLN
1.0000 mL | INTRAMUSCULAR | Status: AC | PRN
Start: 2022-10-07 — End: 2022-10-07
  Administered 2022-10-07: 1 mL via INTRA_ARTICULAR

## 2022-10-07 NOTE — Progress Notes (Signed)
Office Visit Note   Patient: Alexis Ortega           Date of Birth: 04-20-1945           MRN: [id number] Visit Date: 10/07/2022              Requested by: Toma Deiters, MD [address] [place],  [place] [address] PCP: Toma Deiters, MD   Assessment & Plan: Visit Diagnoses:  1. Neck pain   2. Mid back pain   3. Chronic bilateral low back pain, unspecified whether sciatica present   4. Trochanteric bursitis, right hip     Plan: Injection performed which she tolerated well.  She can return if she has increased symptoms.  She had known disc bulge with some lateral recess stenosis with previous diagnostic test 14 years ago.  Hopefully the injection will settle down her symptoms.  She has some adjacent spondylosis at C3-4 above her solid fusion but no myelopathic symptoms currently.  Follow-Up Instructions: No follow-ups on file.   Orders:  Orders Placed This Encounter  Procedures   Large Joint Inj: R greater trochanter   XR Cervical Spine 2 or 3 views   XR Lumbar Spine 2-3 Views   XR Thoracic Spine 2 View   No orders of the defined types were placed in this encounter.     Procedures: Large Joint Inj: R greater trochanter on 10/07/2022 11:21 AM Details: lateral approach Medications: 1 mL lidocaine 1 %; 1 mL bupivacaine 0.25 %; 40 mg methylPREDNISolone acetate 40 MG/ML      Clinical Data: No additional findings.   Subjective: Chief Complaint  Patient presents with   Lower Back - Pain    Fall 06/22/2022   Middle Back - Pain    Fall 06/22/2022   Neck - Pain    Fall 06/22/2022    HPI 77 year old female had three-level cervical fusion done by Dr. Ky Barban in [place] is retired she has some persistent neck pain symptoms degenerative changes see 3-4 above her solid fusion.  She has more back pain and more pain over the left greater trochanter.  Previous myelogram CT scan showed disc bulge and facet arthropathy at L4-5.  Plain radiograph today demonstrates L4-5 disc  space narrowing.  Past smoker quit 3 years ago.  She does have hypertension.  COPD.  Previous fall in April.  History of wrist fracture remotely and took some oxycodone at that time currently on no pain medication.  Review of Systems all other systems noncontributory to HPI.   Objective: Vital Signs: Ht 5\' 4"  (1.626 m)   Wt 152 lb (68.9 kg)   BMI 26.09 kg/m   Physical Exam Constitutional:      Appearance: She is well-developed.  HENT:     Head: Normocephalic.     Right Ear: External ear normal.     Left Ear: External ear normal.  Eyes:     Pupils: Pupils are equal, round, and reactive to light.  Neck:     Thyroid: No thyromegaly.     Trachea: No tracheal deviation.     Comments: Healed anterior cervical incision decreased cervical rotation flexion and extension. Cardiovascular:     Rate and Rhythm: Normal rate.  Pulmonary:     Effort: Pulmonary effort is normal.  Abdominal:     Palpations: Abdomen is soft.  Skin:    General: Skin is warm and dry.  Neurological:     Mental Status: She is alert and oriented to  person, place, and time.  Psychiatric:        Behavior: Behavior normal.     Ortho Exam significant tenderness over the right greater trochanter.  Minimal tenderness left some sciatic notch tenderness on the right.  Specialty Comments:  No specialty comments available.  Imaging: XR Cervical Spine 2 or 3 views  Result Date: 10/07/2022 [facility] lateral cervical spine images demonstrates plate cage and two-level cervical fusion at C4-5 C5-6.  She has had previous fusion C6-7 with hardware removal which is also solid.  Adjacent degenerative changes at C3-4 above the fusion and C 7-T1 below the fusion. Impression: Three-level cervical fusion with adjacent degenerative changes above and below solid fusion.  XR Thoracic Spine 2 View  Result Date: 10/07/2022 [facility] lateral thoracic spine images are obtained.  There is calcification of the aortic nob.  COPD changes in the lung.   Cervical plate better visualized on cervical images.  Mild thoracic kyphosis without thoracic wedging. Impression: Thoracic radiographs negative for acute changes.  XR Lumbar Spine 2-3 Views  Result Date: 10/07/2022 [facility] lateral lumbar images demonstrate old gallbladder clips and calcification of the abdominal aorta.  Lumbar facet degenerative changes more pronounced at L4-5 with 50% disc space narrowing and some endplate spurring at L5-S1.  SI joint pelvis and hip joints appear normal. Impression: L4-5 disc degeneration.    PMFS History: Patient Active Problem List   Diagnosis Date Noted   Trochanteric bursitis, right hip 10/07/2022   Constipation 09/22/2022   History of colon polyps 09/22/2022   Dysphagia 02/15/2020   COPD exacerbation (HCC) 12/13/2016   Chest pain 10/01/2013   Asthmatic bronchitis 07/11/2012   Cough 07/11/2012   Tobacco abuse 07/11/2012   HTN (hypertension) 07/11/2012   Hypothyroidism 07/11/2012   Syncope 07/11/2012   Hyponatremia 07/11/2012   Anemia 07/11/2012   Past Medical History:  Diagnosis Date   Arthritis    Asthma    Depression    Fibromyalgia    GERD (gastroesophageal reflux disease)    Hypertension    Hypothyroidism    Thyroid disease     Family History  Problem Relation Age of Onset   Allergies Son    Asthma Daughter     Past Surgical History:  Procedure Laterality Date   ABDOMINAL HYSTERECTOMY     BACK SURGERY     BIOPSY  03/29/2020   Procedure: BIOPSY;  Surgeon: Dolores Frame, MD;  Location: [facility];  Service: Gastroenterology;;   CARPAL TUNNEL RELEASE Right    CHOLECYSTECTOMY     CLOSED REDUCTION RADIAL SHAFT Left 06/29/2022   Procedure: ATTTEMPTED CLOSED REDUCTION RADIAL SHAFT;  Surgeon: Oliver Barre, MD;  Location: [facility];  Service: Orthopedics;  Laterality: Left;  Closed reduction left distal radius fracture   ESOPHAGEAL DILATION N/A 03/29/2020   Procedure: ESOPHAGEAL DILATION;  Surgeon: Dolores Frame,  MD;  Location: [facility];  Service: Gastroenterology;  Laterality: N/A;   ESOPHAGOGASTRODUODENOSCOPY (EGD) WITH PROPOFOL N/A 03/29/2020   Procedure: ESOPHAGOGASTRODUODENOSCOPY (EGD) WITH PROPOFOL;  Surgeon: Dolores Frame, MD;  Location: [facility];  Service: Gastroenterology;  Laterality: N/A;  9:00   EYE SURGERY     cataract with lens implant   KNEE ARTHROSCOPY Left    LEFT HEART CATHETERIZATION WITH CORONARY ANGIOGRAM N/A 10/02/2013   Procedure: LEFT HEART CATHETERIZATION WITH CORONARY ANGIOGRAM;  Surgeon: Runell Gess, MD;  Location: [facility] CATH LAB;  Service: Cardiovascular;  Laterality: N/A;   NECK SURGERY     PERCUTANEOUS PINNING Left  06/29/2022   Procedure: PERCUTANEOUS PINNING OF DISTAL RADIUS;  Surgeon: Oliver Barre, MD;  Location: [facility];  Service: Orthopedics;  Laterality: Left;  Closed reduction and pinning of distal radius fracture   TOTAL KNEE ARTHROPLASTY Left 06/01/2012   Procedure: LEFT TOTAL KNEE ARTHROPLASTY;  Surgeon: Loreta Ave, MD;  Location: [facility] OR;  Service: Orthopedics;  Laterality: Left;   TUBAL LIGATION     Social History   Occupational History   Not on file  Tobacco Use   Smoking status: Former    Current packs/day: 0.00    Average packs/day: 0.5 packs/day for 40.0 years (20.0 ttl pk-yrs)    Types: Cigarettes    Start date: 06/14/1972    Quit date: 06/14/2012    Years since quitting: 10.3    Passive exposure: Past   Smokeless tobacco: Never  Substance and Sexual Activity   Alcohol use: No   Drug use: Not Currently    Comment: fentanyl patch Q three days   Sexual activity: Not on file

## 2022-10-19 ENCOUNTER — Ambulatory Visit: Payer: Medicare Other | Admitting: Orthopedic Surgery

## 2022-10-28 DIAGNOSIS — L570 Actinic keratosis: Secondary | ICD-10-CM | POA: Diagnosis not present

## 2022-10-28 DIAGNOSIS — L821 Other seborrheic keratosis: Secondary | ICD-10-CM | POA: Diagnosis not present

## 2022-10-28 DIAGNOSIS — D485 Neoplasm of uncertain behavior of skin: Secondary | ICD-10-CM | POA: Diagnosis not present

## 2022-11-18 ENCOUNTER — Ambulatory Visit (INDEPENDENT_AMBULATORY_CARE_PROVIDER_SITE_OTHER): Payer: Medicare Other | Admitting: Orthopaedic Surgery

## 2022-11-18 ENCOUNTER — Encounter: Payer: Self-pay | Admitting: Orthopaedic Surgery

## 2022-11-18 VITALS — Ht 64.0 in | Wt 150.0 lb

## 2022-11-18 DIAGNOSIS — G8929 Other chronic pain: Secondary | ICD-10-CM | POA: Diagnosis not present

## 2022-11-18 DIAGNOSIS — M545 Low back pain, unspecified: Secondary | ICD-10-CM | POA: Diagnosis not present

## 2022-11-18 NOTE — Progress Notes (Signed)
Office Visit Note   Patient: Alexis Ortega           Date of Birth: 1945/09/04           MRN: [id number] Visit Date: 11/18/2022              Requested by: Toma Deiters, MD [address] [place],  [place] [address] PCP: Toma Deiters, MD   Assessment & Plan: Visit Diagnoses:  1. Chronic bilateral low back pain, unspecified whether sciatica present     Plan: Patient has significant deconditioning.  She had myogram done cervical lumbar and thoracic spine by Dr. Danielle Dess 2011 and had disc bulging and arthritic changes in the lumbar spine.  She needs some physical therapy which we will schedule to work on lower extremity strengthening.  We can then consider reimaging if she does not make improvement.  States she does not want surgery.  Follow-Up Instructions: Return in about 6 weeks (around 12/30/2022).   Orders:  Orders Placed This Encounter  Procedures   Ambulatory referral to Physical Therapy   No orders of the defined types were placed in this encounter.     Procedures: No procedures performed   Clinical Data: No additional findings.   Subjective: Chief Complaint  Patient presents with   Middle Back - Pain    My back hurts really bad. Starts about just below my bra back and goes down.   Lower Back - Pain    Hurts really bad. Makes me weak. I can't stand or sit for long periods of time. It also makes it hard for me to do a lot of walking. I use 800 mg Ibuprofen occasionally for the pain and a heating pad. I don't get much sleep because of the pain.    HPI 77 year old female with low back pain difficulty walking with claudication symptoms she has to stop sit or lean over cart when she ambulates.  She has used ibuprofen and heating pad no narcotics.  Have problems doing her housework.  She has significant weakness in her lower extremities and has history of hypertension COPD asthmatic bronchitis and tobacco abuse.  She is getting over wrist fracture from April.  Some  cervical pain previous cervical fusion C4-5 C5-6 and previous fusion C6-7 noninstrumented currently.  She has some adjacent degenerative changes above and below her fusions with some spurring at C3-4 and C7-T1.  Review of Systems all systems noncontributory to HPI.   Objective: Vital Signs: Ht 5\' 4"  (1.626 m)   Wt 150 lb (68 kg)   BMI 25.75 kg/m   Physical Exam Constitutional:      Appearance: She is well-developed.  HENT:     Head: Normocephalic.     Right Ear: External ear normal.     Left Ear: External ear normal. There is no impacted cerumen.  Eyes:     Pupils: Pupils are equal, round, and reactive to light.  Neck:     Thyroid: No thyromegaly.     Trachea: No tracheal deviation.  Cardiovascular:     Rate and Rhythm: Normal rate.  Pulmonary:     Effort: Pulmonary effort is normal.  Abdominal:     Palpations: Abdomen is soft.  Musculoskeletal:     Cervical back: No rigidity.  Skin:    General: Skin is warm and dry.  Neurological:     Mental Status: She is alert and oriented to person, place, and time.  Psychiatric:  Behavior: Behavior normal.     Ortho Exam significant deconditioning.  Husband has to help her up.  Extremely slow gait short stride.  In sitting position has bilateral quad weakness anterior tib and ankle dorsiflexion are both weak hamstrings are weak abduction is weak.  Specialty Comments:  No specialty comments available.  Imaging: No results found.   PMFS History: Patient Active Problem List   Diagnosis Date Noted   Trochanteric bursitis, right hip 10/07/2022   Constipation 09/22/2022   History of colon polyps 09/22/2022   Dysphagia 02/15/2020   COPD exacerbation (HCC) 12/13/2016   Chest pain 10/01/2013   Asthmatic bronchitis 07/11/2012   Cough 07/11/2012   Tobacco abuse 07/11/2012   HTN (hypertension) 07/11/2012   Hypothyroidism 07/11/2012   Syncope 07/11/2012   Hyponatremia 07/11/2012   Anemia 07/11/2012   Past Medical  History:  Diagnosis Date   Arthritis    Asthma    Depression    Fibromyalgia    GERD (gastroesophageal reflux disease)    Hypertension    Hypothyroidism    Thyroid disease     Family History  Problem Relation Age of Onset   Allergies Son    Asthma Daughter     Past Surgical History:  Procedure Laterality Date   ABDOMINAL HYSTERECTOMY     BACK SURGERY     BIOPSY  03/29/2020   Procedure: BIOPSY;  Surgeon: Dolores Frame, MD;  [facility]: [facility];  Service: Gastroenterology;;   CARPAL TUNNEL RELEASE Right    CHOLECYSTECTOMY     CLOSED REDUCTION RADIAL SHAFT Left 06/29/2022   Procedure: ATTTEMPTED CLOSED REDUCTION RADIAL SHAFT;  Surgeon: Oliver Barre, MD;  Location: [facility];  Service: Orthopedics;  Laterality: Left;  Closed reduction left distal radius fracture   ESOPHAGEAL DILATION N/A 03/29/2020   Procedure: ESOPHAGEAL DILATION;  Surgeon: Dolores Frame, MD;  [facility]: [facility];  Service: Gastroenterology;  Laterality: N/A;   ESOPHAGOGASTRODUODENOSCOPY (EGD) WITH PROPOFOL N/A 03/29/2020   Procedure: ESOPHAGOGASTRODUODENOSCOPY (EGD) WITH PROPOFOL;  Surgeon: Dolores Frame, MD;  [facility]: [facility];  Service: Gastroenterology;  Laterality: N/A;  9:00   EYE SURGERY     cataract with lens implant   KNEE ARTHROSCOPY Left    LEFT HEART CATHETERIZATION WITH CORONARY ANGIOGRAM N/A 10/02/2013   Procedure: LEFT HEART CATHETERIZATION WITH CORONARY ANGIOGRAM;  Surgeon: Runell Gess, MD;  [facility] CATH LAB;  Service: Cardiovascular;  Laterality: N/A;   NECK SURGERY     PERCUTANEOUS PINNING Left 06/29/2022   Procedure: PERCUTANEOUS PINNING OF DISTAL RADIUS;  Surgeon: Oliver Barre, MD;  Location: [facility];  Service: Orthopedics;  Laterality: Left;  Closed reduction and pinning of distal radius fracture   TOTAL KNEE ARTHROPLASTY Left 06/01/2012   Procedure: LEFT TOTAL KNEE ARTHROPLASTY;  Surgeon: Loreta Ave, MD;  Location: [facility] OR;  Service:  Orthopedics;  Laterality: Left;   TUBAL LIGATION     Social History   Occupational History   Not on file  Tobacco Use   Smoking status: Former    Current packs/day: 0.00    Average packs/day: 0.5 packs/day for 40.0 years (20.0 ttl pk-yrs)    Types: Cigarettes    Start date: 06/14/1972    Quit date: 06/14/2012    Years since quitting: 10.4    Passive exposure: Past   Smokeless tobacco: Never  Substance and Sexual Activity   Alcohol use: No   Drug use: Not Currently    Comment: fentanyl patch Q three days  Sexual activity: Not on file

## 2022-12-04 DIAGNOSIS — M47896 Other spondylosis, lumbar region: Secondary | ICD-10-CM | POA: Diagnosis not present

## 2022-12-07 DIAGNOSIS — S62102D Fracture of unspecified carpal bone, left wrist, subsequent encounter for fracture with routine healing: Secondary | ICD-10-CM | POA: Diagnosis not present

## 2022-12-07 DIAGNOSIS — J40 Bronchitis, not specified as acute or chronic: Secondary | ICD-10-CM | POA: Diagnosis not present

## 2022-12-07 DIAGNOSIS — M5459 Other low back pain: Secondary | ICD-10-CM | POA: Diagnosis not present

## 2022-12-07 DIAGNOSIS — M47896 Other spondylosis, lumbar region: Secondary | ICD-10-CM | POA: Diagnosis not present

## 2022-12-07 DIAGNOSIS — I10 Essential (primary) hypertension: Secondary | ICD-10-CM | POA: Diagnosis not present

## 2022-12-09 DIAGNOSIS — L570 Actinic keratosis: Secondary | ICD-10-CM | POA: Diagnosis not present

## 2022-12-10 DIAGNOSIS — M47896 Other spondylosis, lumbar region: Secondary | ICD-10-CM | POA: Diagnosis not present

## 2022-12-14 DIAGNOSIS — M47896 Other spondylosis, lumbar region: Secondary | ICD-10-CM | POA: Diagnosis not present

## 2022-12-15 DIAGNOSIS — T886XXS Anaphylactic reaction due to adverse effect of correct drug or medicament properly administered, sequela: Secondary | ICD-10-CM | POA: Diagnosis not present

## 2022-12-15 DIAGNOSIS — T886XXA Anaphylactic reaction due to adverse effect of correct drug or medicament properly administered, initial encounter: Secondary | ICD-10-CM | POA: Diagnosis not present

## 2022-12-17 DIAGNOSIS — S6992XA Unspecified injury of left wrist, hand and finger(s), initial encounter: Secondary | ICD-10-CM | POA: Diagnosis not present

## 2022-12-21 DIAGNOSIS — Z1231 Encounter for screening mammogram for malignant neoplasm of breast: Secondary | ICD-10-CM | POA: Diagnosis not present

## 2022-12-25 ENCOUNTER — Other Ambulatory Visit: Payer: Self-pay

## 2022-12-25 DIAGNOSIS — M549 Dorsalgia, unspecified: Secondary | ICD-10-CM

## 2023-01-01 ENCOUNTER — Ambulatory Visit (HOSPITAL_COMMUNITY)
Admission: RE | Admit: 2023-01-01 | Discharge: 2023-01-01 | Disposition: A | Discharge status: Home / Self Care | Payer: Medicare Other | Source: Ambulatory Visit | Attending: Orthopaedic Surgery | Admitting: Orthopaedic Surgery

## 2023-01-01 DIAGNOSIS — Z981 Arthrodesis status: Secondary | ICD-10-CM | POA: Diagnosis not present

## 2023-01-01 DIAGNOSIS — M5124 Other intervertebral disc displacement, thoracic region: Secondary | ICD-10-CM | POA: Diagnosis not present

## 2023-01-01 DIAGNOSIS — M549 Dorsalgia, unspecified: Secondary | ICD-10-CM | POA: Insufficient documentation

## 2023-01-01 DIAGNOSIS — M4804 Spinal stenosis, thoracic region: Secondary | ICD-10-CM | POA: Diagnosis not present

## 2023-01-04 ENCOUNTER — Ambulatory Visit (INDEPENDENT_AMBULATORY_CARE_PROVIDER_SITE_OTHER): Payer: Medicare Other | Admitting: Gastroenterology

## 2023-01-04 ENCOUNTER — Encounter (INDEPENDENT_AMBULATORY_CARE_PROVIDER_SITE_OTHER): Payer: Self-pay | Admitting: Gastroenterology

## 2023-01-04 VITALS — BP 134/75 | HR 71 | Temp 98.7°F | Ht 64.0 in | Wt 152.1 lb

## 2023-01-04 DIAGNOSIS — K649 Unspecified hemorrhoids: Secondary | ICD-10-CM | POA: Diagnosis not present

## 2023-01-04 DIAGNOSIS — K642 Third degree hemorrhoids: Secondary | ICD-10-CM | POA: Insufficient documentation

## 2023-01-04 MED ORDER — HYDROCORTISONE (PERIANAL) 2.5 % EX CREA: 1.0000 | TOPICAL_CREAM | Freq: Four times a day (QID) | CUTANEOUS | 1 refills | Status: DC

## 2023-01-04 NOTE — Patient Instructions (Signed)
I have sent anusol cream to be used up to 4 times per day x10 days, then as needed thereafter for your hemorrhoids Increase water intake, aim for atleast 64 oz per day Increase fruits, veggies and whole grains, kiwi and prunes are especially good for constipation Avoid straining and limit toilet time to avoid putting more pressure on hemorrhoids Please let me know if cream is not improving your symptoms  Follow up 1 year  It was a pleasure to see you today. I want to create trusting relationships with patients and provide genuine, compassionate, and quality care. I truly value your feedback! please be on the lookout for a survey regarding your visit with me today. I appreciate your input about our visit and your time in completing this!    Dalonte Hardage L. Jeanmarie Hubert, MSN, APRN, AGNP-C Adult-Gerontology Nurse Practitioner [facility]

## 2023-01-04 NOTE — Progress Notes (Signed)
Referring Provider: Toma Deiters, MD Primary Care Physician:  Toma Deiters, MD Primary GI Physician: DR. Levon Hedger   Chief Complaint  Patient presents with   Follow-up    Patient here today for a follow up. Patient states she has a hemorrhoid. It is protruding and is not painful nor bleeding.   HPI:   Alexis Ortega is a 77 y.o. female with past medical history of depression, FBM, hypothyroidism, HTN, multiple back surgeries, and history of Schatzki's ring    Patient presenting today for follow up of constipation and hemorrhoids  Last seen July 2024, at that time patient unsure when last colonoscopy was but thought it was in 2016.  Reported at that time she was told to repeat colonoscopy in 1 year.  Patient currently on Suboxone for chronic pain which she had recently started back and noted some constipation secondary to this.  Reports some bleeding after significant constipation and doing an enema about 1 month prior.  Reports history of hemorrhoids.  Has some nausea secondary to taking Suboxone but feels yogurt and Zofran keep this controlled.  Recommended to continue with good water intake, start MiraLAX, consider Movantik if MiraLAX not providing results for constipation, can consider hemorrhoid banding in the future.  Records were reviewed after last visit and appears last colonoscopy was in 2019 with no polyps found.  Patient due for next colonoscopy in October 2029.  Present: Constipation has improved, taking miralax only on occasion. Trying to take suboxone for her back pain only when absolutely necessary.  Water intake is good. Having a BM daily. She notes still has issues with hemorrhoid. No bleeding since constipation improved. She notes that she has difficulty getting herself cleaned after BMs. She is using preparation H on occasion but unsure if this is helping. Denies rectal pain, itching or burning. No red flag symptoms. Patient denies melena, hematochezia, nausea,  vomiting, diarrhea, dysphagia, odyonophagia, early satiety or weight loss.    Last Colonoscopy:12/2017-dr cathey-hemorrhoids/no polyps  Last Endoscopy: 2022  - Non-obstructing Schatzki ring. Dilated.                           - 1 cm hiatal hernia.                           - Nodular mucosa in the gastric body. Biopsied.                           - Normal examined duodenum.  Repeat Colonoscopy 12/2027  Past Medical History:  Diagnosis Date   Arthritis    Asthma    Depression    Fibromyalgia    GERD (gastroesophageal reflux disease)    Hypertension    Hypothyroidism    Thyroid disease     Past Surgical History:  Procedure Laterality Date   ABDOMINAL HYSTERECTOMY     BACK SURGERY     BIOPSY  03/29/2020   Procedure: BIOPSY;  Surgeon: Dolores Frame, MD;  [facility];  Service: Gastroenterology;;   CARPAL TUNNEL RELEASE Right    CHOLECYSTECTOMY     CLOSED REDUCTION RADIAL SHAFT Left 06/29/2022   Procedure: ATTTEMPTED CLOSED REDUCTION RADIAL SHAFT;  Surgeon: Oliver Barre, MD;  Location: [facility];  Service: Orthopedics;  Laterality: Left;  Closed reduction left distal radius fracture   ESOPHAGEAL DILATION N/A 03/29/2020   Procedure: ESOPHAGEAL  DILATION;  Surgeon: Marguerita Merles, Reuel Boom, MD;  Location: [facility];  Service: Gastroenterology;  Laterality: N/A;   ESOPHAGOGASTRODUODENOSCOPY (EGD) WITH PROPOFOL N/A 03/29/2020   Procedure: ESOPHAGOGASTRODUODENOSCOPY (EGD) WITH PROPOFOL;  Surgeon: Dolores Frame, MD;  [facility];  Service: Gastroenterology;  Laterality: N/A;  9:00   EYE SURGERY     cataract with lens implant   KNEE ARTHROSCOPY Left    LEFT HEART CATHETERIZATION WITH CORONARY ANGIOGRAM N/A 10/02/2013   Procedure: LEFT HEART CATHETERIZATION WITH CORONARY ANGIOGRAM;  Surgeon: Runell Gess, MD;  Location: [facility] CATH LAB;  Service: Cardiovascular;  Laterality: N/A;   NECK SURGERY     PERCUTANEOUS PINNING Left 06/29/2022    Procedure: PERCUTANEOUS PINNING OF DISTAL RADIUS;  Surgeon: Oliver Barre, MD;  Location: [facility];  Service: Orthopedics;  Laterality: Left;  Closed reduction and pinning of distal radius fracture   TOTAL KNEE ARTHROPLASTY Left 06/01/2012   Procedure: LEFT TOTAL KNEE ARTHROPLASTY;  Surgeon: Loreta Ave, MD;  Location: [facility] OR;  Service: Orthopedics;  Laterality: Left;   TUBAL LIGATION      Current Outpatient Medications  Medication Sig Dispense Refill   acetaminophen (TYLENOL) 500 MG tablet Take 500 mg by mouth every 6 (six) hours as needed for moderate pain or mild pain.     ALPRAZolam (XANAX) 0.5 MG tablet Take 0.5 mg by mouth at bedtime.     amLODipine (NORVASC) 10 MG tablet Take 10 mg by mouth daily.     citalopram (CELEXA) 20 MG tablet Take 20 mg by mouth daily.     levothyroxine (SYNTHROID) 100 MCG tablet Take 100 mcg by mouth daily before breakfast.      losartan (COZAAR) 100 MG tablet Take 100 mg by mouth daily.     Multiple Vitamin (MULTIVITAMIN WITH MINERALS) TABS tablet Take 1 tablet by mouth daily.     ondansetron (ZOFRAN ODT) 4 MG disintegrating tablet 4mg  ODT q4 hours prn nausea/vomit (Patient taking differently: Take 4 mg by mouth every 4 (four) hours as needed for nausea or vomiting. 4mg  ODT q4 hours prn nausea/vomit) 12 tablet 0   PROAIR HFA 108 (90 BASE) MCG/ACT inhaler Inhale 2 puffs into the lungs every 4 (four) hours as needed for wheezing.      Zinc 50 MG CAPS Take 50 mg by mouth daily.     buprenorphine-naloxone (SUBOXONE) 2-0.5 mg SUBL SL tablet Place under the tongue daily. One daily (Patient not taking: Reported on 01/04/2023)     No current facility-administered medications for this visit.    Allergies as of 01/04/2023 - Review Complete 01/04/2023  Allergen Reaction Noted   Cefuroxime Shortness Of Breath 11/18/2022   Diclofenac Rash    Tramadol Itching 10/12/2011    Family History  Problem Relation Age of Onset   Allergies Son    Asthma Daughter      Social History   Socioeconomic History   Marital status: Married    Spouse name: Not on file   Number of children: Not on file   Years of education: Not on file   Highest education level: Not on file  Occupational History   Not on file  Tobacco Use   Smoking status: Former    Current packs/day: 0.00    Average packs/day: 0.5 packs/day for 40.0 years (20.0 ttl pk-yrs)    Types: Cigarettes    Start date: 06/14/1972    Quit date: 06/14/2012    Years since quitting: 10.5    Passive exposure:  Past   Smokeless tobacco: Never  Substance and Sexual Activity   Alcohol use: No   Drug use: Not Currently    Comment: fentanyl patch Q three days   Sexual activity: Not on file  Other Topics Concern   Not on file  Social History Narrative   Not on file   Social Determinants of Health   Financial Resource Strain: Not on file  Food Insecurity: Not on file  Transportation Needs: Not on file  Physical Activity: Not on file  Stress: Not on file  Social Connections: Not on file    Review of systems General: negative for malaise, night sweats, fever, chills, weight loss Neck: Negative for lumps, goiter, pain and significant neck swelling Resp: Negative for cough, wheezing, dyspnea at rest CV: Negative for chest pain, leg swelling, palpitations, orthopnea GI: denies melena, hematochezia, nausea, vomiting, diarrhea, constipation, dysphagia, odyonophagia, early satiety or unintentional weight loss. +hemorrhoids  The remainder of the review of systems is noncontributory.  Physical Exam: BP 134/75 (BP Location: Left Arm, Patient Position: Sitting, Cuff Size: Normal)   Pulse 71   Temp 98.7 F (37.1 C) (Temporal)   Ht 5\' 4"  (1.626 m)   Wt 152 lb 1.6 oz (69 kg)   BMI 26.11 kg/m  General:   Alert and oriented. No distress noted. Pleasant and cooperative.  Head:  Normocephalic and atraumatic. Eyes:  Conjuctiva clear without scleral icterus. Mouth:  Oral mucosa pink and moist. Good  dentition. No lesions. Heart: Normal rate and rhythm, s1 and s2 heart sounds present.  Lungs: Clear lung sounds in all lobes. Respirations equal and unlabored. Abdomen:  +BS, soft, non-tender and non-distended. No rebound or guarding. No HSM or masses noted. Neurologic:  Alert and  oriented x4 Psych:  Alert and cooperative. Normal mood and affect.  Invalid input(s): "6 MONTHS"   ASSESSMENT: KATLEYN CIFALDI is a 77 y.o. female presenting today for follow up of constipation and hemorrhoids  Constipation improved since only taking suboxone when absolutely necessary. Having a BM daily and using miralax on rare occasion when feeling constipated. Still noting hemorrhoids which cause her to have difficulty cleaning herself after a BM but no rectal bleeding, rectal pain, itching or burning. Using preparation H without much improvement. Will send anusol cream to be used QID x10 days then PRN thereafter, should continue with good water intake, diet high in fruits, veggies, whole grains, avoid straining and limit toilet time. She should make me aware if hemorrhoids do not improve.   PLAN:  Rx anusol cream QID  2. Good water intake, diet high in fruits, veggies, whole grains  3. Avoid straining, limit toilet time 4. Pt to make me aware if hemorrhoids do not improve  All questions were answered, patient verbalized understanding and is in agreement with plan as outlined above.    Follow Up: 1 year   Choua Ikner L. Jeanmarie Hubert, MSN, APRN, AGNP-C Adult-Gerontology Nurse Practitioner [facility]  I have reviewed the note and agree with the APP's assessment as described in this progress note  Katrinka Blazing, MD Gastroenterology and Hepatology [facility]

## 2023-01-20 ENCOUNTER — Encounter: Payer: Self-pay | Admitting: Orthopaedic Surgery

## 2023-01-20 ENCOUNTER — Ambulatory Visit (INDEPENDENT_AMBULATORY_CARE_PROVIDER_SITE_OTHER): Payer: Medicare Other | Admitting: Orthopaedic Surgery

## 2023-01-20 VITALS — Ht 64.0 in | Wt 152.0 lb

## 2023-01-20 DIAGNOSIS — M545 Low back pain, unspecified: Secondary | ICD-10-CM | POA: Diagnosis not present

## 2023-01-21 NOTE — Progress Notes (Signed)
Office Visit Note   Patient: Alexis Ortega           Date of Birth: [birth year]           MRN: [id number] Visit Date: 01/20/2023              Requested by: Toma Deiters, MD [address] [place],  [place] [address] PCP: Toma Deiters, MD   Assessment & Plan: Visit Diagnoses:  1. Pain of lumbar spine     Plan: Will proceed with a lumbar MRI scan.  She does have facet arthropathy by myelogram done by Dr. Danielle Dess several years ago at the L4-5 level.  Will obtain lumbar MRI to rule out lumbar spinal stenosis.  Follow-Up Instructions: No follow-ups on file.   Orders:  Orders Placed This Encounter  Procedures   MR Lumbar Spine w/o contrast   No orders of the defined types were placed in this encounter.     Procedures: No procedures performed   Clinical Data: No additional findings.   Subjective: Chief Complaint  Patient presents with   Middle Back - Pain, Follow-up    MRI review    HPI 77 year old female returns with chronic pain problems in her back.  She has had MRI scan thoracic spine results listed below which showed chronic T11-T12 partially calcified disc protrusion without thoracic spinal stenosis.  Previous cervical fusion and again is visualized.  She has had surgery by Dr. Danielle Dess in the distant past.  She again has problems with claudication problems with the walking she cannot fix an entire meal even though she has a stool that she sits on intermittently.  She can walk 200 feet.  She smoked for 50+ years quit 3 years ago.  Patient gets some relief with the sitting in a heating pad or supine position.  Past long-term history of pain medication she was on buprenorphine tablets, fentanyl patches in 2022 and 2023 also oxycodone she has been off narcotics since June 2024 Increased problems with hills with claudication. Review of Systems all systems noncontributory to HPI. Of note is osteoporosis history of wrist fracture 2024.  Past smoker quit 3 years  ago.  Objective: Vital Signs: Ht 5\' 4"  (1.626 m)   Wt 152 lb (68.9 kg)   BMI 26.09 kg/m   Physical Exam Constitutional:      Appearance: She is well-developed.  HENT:     Head: Normocephalic.     Right Ear: External ear normal.     Left Ear: External ear normal. There is no impacted cerumen.  Eyes:     Pupils: Pupils are equal, round, and reactive to light.  Neck:     Thyroid: No thyromegaly.     Trachea: No tracheal deviation.  Cardiovascular:     Rate and Rhythm: Normal rate.  Pulmonary:     Effort: Pulmonary effort is normal.  Abdominal:     Palpations: Abdomen is soft.  Musculoskeletal:     Cervical back: No rigidity.  Skin:    General: Skin is warm and dry.  Neurological:     Mental Status: She is alert and oriented to person, place, and time.  Psychiatric:        Behavior: Behavior normal.     Ortho Exam patient has no right dorsalis pedis she has strong dorsalis pedis on the left 1+ posterior tib right and the left.  Foot is warm.  Good capillary refill.  Mild trochanteric bursal tenderness mild sciatic notch tenderness.  Specialty Comments:  No specialty comments available.  Imaging: CLINICAL DATA:  77 year old female with back pain after a fall in April.   EXAM: MRI THORACIC SPINE WITHOUT CONTRAST   TECHNIQUE: Multiplanar, multisequence MR imaging of the thoracic spine was performed. No intravenous contrast was administered.   COMPARISON:  Chest CT 06/03/2016.   FINDINGS: Limited cervical spine imaging: Previous cervical ACDF. CT evidence of C6-C7 interbody arthrodesis and removed C7 hardware in 2018.   Thoracic spine segmentation: Hypoplastic ribs at T12 but otherwise normal.   Alignment: Exaggerated thoracic kyphosis appears stable since 2018. No significant scoliosis or spondylolisthesis.   Vertebrae: Stable vertebral body height since 2018. Background bone marrow signal is normal. No marrow edema or evidence of acute osseous abnormality.  Visible upper lumbar levels also appear intact.   Cord: Normal thoracic spinal cord signal and morphology above T11-T12. Mild right hemi cord mass effect at that level, detailed below. No associated cord signal abnormality. Conus medullaris appears to be normal at T12-L1   Paraspinal and other soft tissues: Negative.   Disc levels:   Mild for age thoracic spine degeneration and normal thoracic spinal canal patency with the following exceptions:   T7-T8: Moderate facet hypertrophy on the left. Moderate left T7 neural foraminal stenosis.   T8-T9: Moderate left facet hypertrophy. Mild left T8 foraminal stenosis.   T11-T12: Chronic right paracentral partially calcified disc protrusion, present in 2018. Narrow due ventral CSF space there and mild right hemi cord mass effect (series 22, image 35). But no significant spinal or neural foraminal stenosis.   IMPRESSION: 1. No acute osseous abnormality in the thoracic spine. 2. Generally mild for age thoracic spine degeneration. Chronic T11-T12 partially calcified disc protrusion with no significant thoracic spinal stenosis. Moderate degenerative left T7 neural foraminal stenosis due to facet degeneration. 3. Chronic cervical ACDF.     Electronically Signed   By: Odessa Fleming M.D.   On: 01/20/2023 10:38   PMFS History: Patient Active Problem List   Diagnosis Date Noted   Grade III hemorrhoids 01/04/2023   Trochanteric bursitis, right hip 10/07/2022   Constipation 09/22/2022   History of colon polyps 09/22/2022   Dysphagia 02/15/2020   COPD exacerbation (HCC) 12/13/2016   Chest pain 10/01/2013   Asthmatic bronchitis 07/11/2012   Cough 07/11/2012   Tobacco abuse 07/11/2012   HTN (hypertension) 07/11/2012   Hypothyroidism 07/11/2012   Syncope 07/11/2012   Hyponatremia 07/11/2012   Anemia 07/11/2012   Past Medical History:  Diagnosis Date   Arthritis    Asthma    Depression    Fibromyalgia    GERD (gastroesophageal reflux  disease)    Hypertension    Hypothyroidism    Thyroid disease     Family History  Problem Relation Age of Onset   Allergies Son    Asthma Daughter     Past Surgical History:  Procedure Laterality Date   ABDOMINAL HYSTERECTOMY     BACK SURGERY     BIOPSY  03/29/2020   Procedure: BIOPSY;  Surgeon: Dolores Frame, MD;  Location: [facility];  Service: Gastroenterology;;   CARPAL TUNNEL RELEASE Right    CHOLECYSTECTOMY     CLOSED REDUCTION RADIAL SHAFT Left 06/29/2022   Procedure: ATTTEMPTED CLOSED REDUCTION RADIAL SHAFT;  Surgeon: Oliver Barre, MD;  Location: [facility];  Service: Orthopedics;  Laterality: Left;  Closed reduction left distal radius fracture   ESOPHAGEAL DILATION N/A 03/29/2020   Procedure: ESOPHAGEAL DILATION;  Surgeon: Dolores Frame, MD;  Location:  [facility];  Service: Gastroenterology;  Laterality: N/A;   ESOPHAGOGASTRODUODENOSCOPY (EGD) WITH PROPOFOL N/A 03/29/2020   Procedure: ESOPHAGOGASTRODUODENOSCOPY (EGD) WITH PROPOFOL;  Surgeon: Dolores Frame, MD;  Location: [facility];  Service: Gastroenterology;  Laterality: N/A;  9:00   EYE SURGERY     cataract with lens implant   KNEE ARTHROSCOPY Left    LEFT HEART CATHETERIZATION WITH CORONARY ANGIOGRAM N/A 10/02/2013   Procedure: LEFT HEART CATHETERIZATION WITH CORONARY ANGIOGRAM;  Surgeon: Runell Gess, MD;  Location: [facility] CATH LAB;  Service: Cardiovascular;  Laterality: N/A;   NECK SURGERY     PERCUTANEOUS PINNING Left 06/29/2022   Procedure: PERCUTANEOUS PINNING OF DISTAL RADIUS;  Surgeon: Oliver Barre, MD;  Location: [facility];  Service: Orthopedics;  Laterality: Left;  Closed reduction and pinning of distal radius fracture   TOTAL KNEE ARTHROPLASTY Left 06/01/2012   Procedure: LEFT TOTAL KNEE ARTHROPLASTY;  Surgeon: Loreta Ave, MD;  Location: [facility] OR;  Service: Orthopedics;  Laterality: Left;   TUBAL LIGATION     Social History   Occupational History   Not on file   Tobacco Use   Smoking status: Former    Current packs/day: 0.00    Average packs/day: 0.5 packs/day for 40.0 years (20.0 ttl pk-yrs)    Types: Cigarettes    Start date: 06/14/1972    Quit date: 06/14/2012    Years since quitting: 10.6    Passive exposure: Past   Smokeless tobacco: Never  Substance and Sexual Activity   Alcohol use: No   Drug use: Not Currently    Comment: fentanyl patch Q three days   Sexual activity: Not on file

## 2023-01-28 ENCOUNTER — Ambulatory Visit (HOSPITAL_COMMUNITY)
Admission: RE | Admit: 2023-01-28 | Discharge: 2023-01-28 | Disposition: A | Discharge status: Home / Self Care | Payer: Medicare Other | Source: Ambulatory Visit | Attending: Orthopaedic Surgery | Admitting: Orthopaedic Surgery

## 2023-01-28 DIAGNOSIS — M5126 Other intervertebral disc displacement, lumbar region: Secondary | ICD-10-CM | POA: Diagnosis not present

## 2023-01-28 DIAGNOSIS — M5136 Other intervertebral disc degeneration, lumbar region with discogenic back pain only: Secondary | ICD-10-CM | POA: Diagnosis not present

## 2023-01-28 DIAGNOSIS — M545 Low back pain, unspecified: Secondary | ICD-10-CM | POA: Diagnosis not present

## 2023-01-28 DIAGNOSIS — M5135 Other intervertebral disc degeneration, thoracolumbar region: Secondary | ICD-10-CM | POA: Diagnosis not present

## 2023-01-28 DIAGNOSIS — M48061 Spinal stenosis, lumbar region without neurogenic claudication: Secondary | ICD-10-CM | POA: Diagnosis not present

## 2023-02-08 DIAGNOSIS — N182 Chronic kidney disease, stage 2 (mild): Secondary | ICD-10-CM | POA: Diagnosis not present

## 2023-02-08 DIAGNOSIS — J449 Chronic obstructive pulmonary disease, unspecified: Secondary | ICD-10-CM | POA: Diagnosis not present

## 2023-02-08 DIAGNOSIS — I10 Essential (primary) hypertension: Secondary | ICD-10-CM | POA: Diagnosis not present

## 2023-02-08 DIAGNOSIS — M5459 Other low back pain: Secondary | ICD-10-CM | POA: Diagnosis not present

## 2023-02-18 ENCOUNTER — Ambulatory Visit: Payer: Medicare Other | Admitting: Orthopaedic Surgery

## 2023-02-18 ENCOUNTER — Encounter: Payer: Self-pay | Admitting: Orthopaedic Surgery

## 2023-02-18 VITALS — Ht 64.0 in | Wt 152.0 lb

## 2023-02-18 DIAGNOSIS — Z9889 Other specified postprocedural states: Secondary | ICD-10-CM | POA: Diagnosis not present

## 2023-02-18 NOTE — Progress Notes (Signed)
Office Visit Note   Patient: Alexis Ortega           Date of Birth: 06/20/1945           MRN: [id number] Visit Date: 02/18/2023              Requested by: Toma Deiters, MD [address] [place],  [place] [address] PCP: Toma Deiters, MD   Assessment & Plan: Visit Diagnoses:  1. History of lumbar laminectomy for spinal cord decompression     Plan: Discussed patient will have to do some of her lumbar exercises for core strengthening and then she could progress to a fitness program at the gym.  She has some muscle atrophy and will have to gradually work on increasing her strength to help support lumbar spine to help with her symptoms.  She has pain after prolonged sitting and we have encouraged her to get up walk just a little bit and then she can resume sitting position at meals.  Satisfactory decompression from her previous surgery and no areas that require surgery at this point.  Follow-Up Instructions: No follow-ups on file.   Orders:  No orders of the defined types were placed in this encounter.  No orders of the defined types were placed in this encounter.     Procedures: No procedures performed   Clinical Data: No additional findings.   Subjective: Chief Complaint  Patient presents with   Lower Back - Pain, Follow-up    MRI review    HPI previous lumbar decompression by Dr.Dimmig L4-5 many years ago 77 year old female with chronic back pain.  Therapy she states actually made it worse MRI scan shows some mild degenerative changes no areas of significant compression and mild facet degenerative changes.  She has paralumbar muscle atrophy noted more so on the lower lumbar than upper.  Previous cervical fusion with some decreased range of motion.  Review of Systems all the systems updated unchanged.   Objective: Vital Signs: Ht 5\' 4"  (1.626 m)   Wt 152 lb (68.9 kg)   BMI 26.09 kg/m   Physical Exam Constitutional:      Appearance: She is well-developed.   HENT:     Head: Normocephalic.     Right Ear: External ear normal.     Left Ear: External ear normal. There is no impacted cerumen.  Eyes:     Pupils: Pupils are equal, round, and reactive to light.  Neck:     Thyroid: No thyromegaly.     Trachea: No tracheal deviation.  Cardiovascular:     Rate and Rhythm: Normal rate.  Pulmonary:     Effort: Pulmonary effort is normal.  Abdominal:     Palpations: Abdomen is soft.  Musculoskeletal:     Cervical back: No rigidity.  Skin:    General: Skin is warm and dry.  Neurological:     Mental Status: She is alert and oriented to person, place, and time.  Psychiatric:        Behavior: Behavior normal.     Ortho Exam some thoracolumbar tenderness.  No rash or exposed skin and pulses are intact anterior tib gastrocsoleus is intact negative logroll hips.  Specialty Comments:  No specialty comments available.  Imaging: CLINICAL DATA:  Back pain for 6 months. Low back pain. New symptoms of claudication. History of L4-5 surgery.   EXAM: MRI LUMBAR SPINE WITHOUT CONTRAST   TECHNIQUE: Multiplanar, multisequence MR imaging of the lumbar spine was performed. No intravenous  contrast was administered.   COMPARISON:  11/21/2009 lumbar CT.   FINDINGS: Segmentation:  Standard.   Alignment:  Physiologic.   Vertebrae:  No fracture, evidence of discitis, or bone lesion.   Conus medullaris and cauda equina: Conus extends to the T12-L1 level. Conus and cauda equina appear normal.   Paraspinal and other soft tissues: No perispinal mass or inflammation. Left renal cyst measuring 4.9 cm.   Disc levels:   T11-12: Disc space narrowing with central protrusion. No neural compression.   T12-L1: Disc bulging with left foraminal protrusion. Ventral endplate spurring. No neural compression.   L1-L2: Disc narrowing and mild bulging. Negative facets and no neural impingement.   L2-L3: Disc narrowing and bulging.  No neural compression    L3-L4: Disc desiccation and narrowing with bulge. Superimposed biforaminal protrusion greater on the right. Mild right foraminal narrowing, noncompressive   L4-L5: Greatest level of disc narrowing with circumferential bulging. Degenerative facet spurring asymmetric to the left. Cleft in the spinous process at this level from old fracture or surgery. No neural compression or edema. Mild left foraminal stenosis.   L5-S1:Degenerative facet spurring greater on the left. There is circumferential disc bulging. Mild left foraminal stenosis.   IMPRESSION: Ordinary, generalized lumbar spine degeneration with prior decompression at L4-5. No neural impingement to explain history of claudication. Up to mild foraminal narrowing on the left at L4-5 and L5-S1.     Electronically Signed   By: Tiburcio Pea M.D.   On: 02/17/2023 19:32   PMFS History: Patient Active Problem List   Diagnosis Date Noted   History of lumbar laminectomy for spinal cord decompression 02/18/2023   Grade III hemorrhoids 01/04/2023   Trochanteric bursitis, right hip 10/07/2022   Constipation 09/22/2022   History of colon polyps 09/22/2022   Dysphagia 02/15/2020   COPD exacerbation (HCC) 12/13/2016   Chest pain 10/01/2013   Asthmatic bronchitis 07/11/2012   Cough 07/11/2012   Tobacco abuse 07/11/2012   HTN (hypertension) 07/11/2012   Hypothyroidism 07/11/2012   Syncope 07/11/2012   Hyponatremia 07/11/2012   Anemia 07/11/2012   Past Medical History:  Diagnosis Date   Arthritis    Asthma    Depression    Fibromyalgia    GERD (gastroesophageal reflux disease)    Hypertension    Hypothyroidism    Thyroid disease     Family History  Problem Relation Age of Onset   Allergies Son    Asthma Daughter     Past Surgical History:  Procedure Laterality Date   ABDOMINAL HYSTERECTOMY     BACK SURGERY     BIOPSY  03/29/2020   Procedure: BIOPSY;  Surgeon: Dolores Frame, MD;  [facility]: [facility];  Service: Gastroenterology;;   CARPAL TUNNEL RELEASE Right    CHOLECYSTECTOMY     CLOSED REDUCTION RADIAL SHAFT Left 06/29/2022   Procedure: ATTTEMPTED CLOSED REDUCTION RADIAL SHAFT;  Surgeon: Oliver Barre, MD;  Location: [facility];  Service: Orthopedics;  Laterality: Left;  Closed reduction left distal radius fracture   ESOPHAGEAL DILATION N/A 03/29/2020   Procedure: ESOPHAGEAL DILATION;  Surgeon: Dolores Frame, MD;  Location: [facility];  Service: Gastroenterology;  Laterality: N/A;   ESOPHAGOGASTRODUODENOSCOPY (EGD) WITH PROPOFOL N/A 03/29/2020   Procedure: ESOPHAGOGASTRODUODENOSCOPY (EGD) WITH PROPOFOL;  Surgeon: Dolores Frame, MD;  Location: [facility];  Service: Gastroenterology;  Laterality: N/A;  9:00   EYE SURGERY     cataract with lens implant   KNEE ARTHROSCOPY Left    LEFT  HEART CATHETERIZATION WITH CORONARY ANGIOGRAM N/A 10/02/2013   Procedure: LEFT HEART CATHETERIZATION WITH CORONARY ANGIOGRAM;  Surgeon: Runell Gess, MD;  Location: [facility] CATH LAB;  Service: Cardiovascular;  Laterality: N/A;   NECK SURGERY     PERCUTANEOUS PINNING Left 06/29/2022   Procedure: PERCUTANEOUS PINNING OF DISTAL RADIUS;  Surgeon: Oliver Barre, MD;  Location: [facility];  Service: Orthopedics;  Laterality: Left;  Closed reduction and pinning of distal radius fracture   TOTAL KNEE ARTHROPLASTY Left 06/01/2012   Procedure: LEFT TOTAL KNEE ARTHROPLASTY;  Surgeon: Loreta Ave, MD;  Location: [facility] OR;  Service: Orthopedics;  Laterality: Left;   TUBAL LIGATION     Social History   Occupational History   Not on file  Tobacco Use   Smoking status: Former    Current packs/day: 0.00    Average packs/day: 0.5 packs/day for 40.0 years (20.0 ttl pk-yrs)    Types: Cigarettes    Start date: 06/14/1972    Quit date: 06/14/2012    Years since quitting: 10.6    Passive exposure: Past   Smokeless tobacco: Never  Substance and Sexual Activity   Alcohol use: No   Drug use: Not  Currently    Comment: fentanyl patch Q three days   Sexual activity: Not on file

## 2023-03-03 ENCOUNTER — Encounter (INDEPENDENT_AMBULATORY_CARE_PROVIDER_SITE_OTHER): Payer: Self-pay | Admitting: *Deleted

## 2023-04-05 DIAGNOSIS — M5003 Cervical disc disorder with myelopathy, cervicothoracic region: Secondary | ICD-10-CM | POA: Diagnosis not present

## 2023-04-06 DIAGNOSIS — Z122 Encounter for screening for malignant neoplasm of respiratory organs: Secondary | ICD-10-CM | POA: Diagnosis not present

## 2023-04-06 DIAGNOSIS — Z87891 Personal history of nicotine dependence: Secondary | ICD-10-CM | POA: Diagnosis not present

## 2023-04-08 DIAGNOSIS — I6523 Occlusion and stenosis of bilateral carotid arteries: Secondary | ICD-10-CM | POA: Diagnosis not present

## 2023-04-08 DIAGNOSIS — M5000 Cervical disc disorder with myelopathy, unspecified cervical region: Secondary | ICD-10-CM | POA: Diagnosis not present

## 2023-04-12 DIAGNOSIS — J449 Chronic obstructive pulmonary disease, unspecified: Secondary | ICD-10-CM | POA: Diagnosis not present

## 2023-04-12 DIAGNOSIS — E7849 Other hyperlipidemia: Secondary | ICD-10-CM | POA: Diagnosis not present

## 2023-04-12 DIAGNOSIS — Z0000 Encounter for general adult medical examination without abnormal findings: Secondary | ICD-10-CM | POA: Diagnosis not present

## 2023-04-12 DIAGNOSIS — M5003 Cervical disc disorder with myelopathy, cervicothoracic region: Secondary | ICD-10-CM | POA: Diagnosis not present

## 2023-04-12 DIAGNOSIS — N182 Chronic kidney disease, stage 2 (mild): Secondary | ICD-10-CM | POA: Diagnosis not present

## 2023-04-16 ENCOUNTER — Inpatient Hospital Stay (HOSPITAL_COMMUNITY)
Admission: EM | Admit: 2023-04-16 | Discharge: 2023-04-21 | DRG: 193 | Disposition: A | Discharge status: Home / Self Care | Payer: Medicare Other | Attending: Internal Medicine | Admitting: Internal Medicine

## 2023-04-16 ENCOUNTER — Emergency Department (HOSPITAL_COMMUNITY): Payer: Medicare Other

## 2023-04-16 ENCOUNTER — Encounter (HOSPITAL_COMMUNITY): Payer: Self-pay

## 2023-04-16 ENCOUNTER — Other Ambulatory Visit: Payer: Self-pay

## 2023-04-16 DIAGNOSIS — J101 Influenza due to other identified influenza virus with other respiratory manifestations: Secondary | ICD-10-CM | POA: Diagnosis present

## 2023-04-16 DIAGNOSIS — F419 Anxiety disorder, unspecified: Secondary | ICD-10-CM | POA: Diagnosis present

## 2023-04-16 DIAGNOSIS — J441 Chronic obstructive pulmonary disease with (acute) exacerbation: Secondary | ICD-10-CM | POA: Diagnosis not present

## 2023-04-16 DIAGNOSIS — F32A Depression, unspecified: Secondary | ICD-10-CM | POA: Diagnosis present

## 2023-04-16 DIAGNOSIS — G894 Chronic pain syndrome: Secondary | ICD-10-CM | POA: Diagnosis not present

## 2023-04-16 DIAGNOSIS — E039 Hypothyroidism, unspecified: Secondary | ICD-10-CM | POA: Diagnosis present

## 2023-04-16 DIAGNOSIS — E869 Volume depletion, unspecified: Secondary | ICD-10-CM | POA: Diagnosis present

## 2023-04-16 DIAGNOSIS — Z87891 Personal history of nicotine dependence: Secondary | ICD-10-CM | POA: Diagnosis not present

## 2023-04-16 DIAGNOSIS — Z885 Allergy status to narcotic agent status: Secondary | ICD-10-CM | POA: Diagnosis not present

## 2023-04-16 DIAGNOSIS — M797 Fibromyalgia: Secondary | ICD-10-CM | POA: Diagnosis present

## 2023-04-16 DIAGNOSIS — Z90710 Acquired absence of both cervix and uterus: Secondary | ICD-10-CM

## 2023-04-16 DIAGNOSIS — I700 Atherosclerosis of aorta: Secondary | ICD-10-CM | POA: Diagnosis not present

## 2023-04-16 DIAGNOSIS — Z825 Family history of asthma and other chronic lower respiratory diseases: Secondary | ICD-10-CM

## 2023-04-16 DIAGNOSIS — Z888 Allergy status to other drugs, medicaments and biological substances status: Secondary | ICD-10-CM

## 2023-04-16 DIAGNOSIS — E871 Hypo-osmolality and hyponatremia: Secondary | ICD-10-CM | POA: Diagnosis not present

## 2023-04-16 DIAGNOSIS — Z1152 Encounter for screening for COVID-19: Secondary | ICD-10-CM

## 2023-04-16 DIAGNOSIS — Z96652 Presence of left artificial knee joint: Secondary | ICD-10-CM | POA: Diagnosis present

## 2023-04-16 DIAGNOSIS — Z9049 Acquired absence of other specified parts of digestive tract: Secondary | ICD-10-CM | POA: Diagnosis not present

## 2023-04-16 DIAGNOSIS — R112 Nausea with vomiting, unspecified: Secondary | ICD-10-CM | POA: Diagnosis not present

## 2023-04-16 DIAGNOSIS — Z79890 Hormone replacement therapy (postmenopausal): Secondary | ICD-10-CM

## 2023-04-16 DIAGNOSIS — J9601 Acute respiratory failure with hypoxia: Secondary | ICD-10-CM | POA: Diagnosis not present

## 2023-04-16 DIAGNOSIS — I10 Essential (primary) hypertension: Secondary | ICD-10-CM | POA: Diagnosis present

## 2023-04-16 DIAGNOSIS — Z9889 Other specified postprocedural states: Secondary | ICD-10-CM | POA: Diagnosis not present

## 2023-04-16 DIAGNOSIS — K219 Gastro-esophageal reflux disease without esophagitis: Secondary | ICD-10-CM | POA: Diagnosis not present

## 2023-04-16 DIAGNOSIS — R059 Cough, unspecified: Secondary | ICD-10-CM | POA: Diagnosis not present

## 2023-04-16 DIAGNOSIS — R0602 Shortness of breath: Secondary | ICD-10-CM | POA: Diagnosis not present

## 2023-04-16 DIAGNOSIS — R739 Hyperglycemia, unspecified: Secondary | ICD-10-CM | POA: Diagnosis present

## 2023-04-16 DIAGNOSIS — R5381 Other malaise: Secondary | ICD-10-CM | POA: Diagnosis present

## 2023-04-16 DIAGNOSIS — Z79899 Other long term (current) drug therapy: Secondary | ICD-10-CM | POA: Diagnosis not present

## 2023-04-16 LAB — COMPREHENSIVE METABOLIC PANEL
ALT: 24 U/L (ref 0–44)
AST: 33 U/L (ref 15–41)
Albumin: 3.8 g/dL (ref 3.5–5.0)
Alkaline Phosphatase: 50 U/L (ref 38–126)
Anion gap: 13 (ref 5–15)
BUN: 17 mg/dL (ref 8–23)
CO2: 20 mmol/L — ABNORMAL LOW (ref 22–32)
Calcium: 9.3 mg/dL (ref 8.9–10.3)
Chloride: 96 mmol/L — ABNORMAL LOW (ref 98–111)
Creatinine, Ser: 0.85 mg/dL (ref 0.44–1.00)
GFR, Estimated: >60 mL/min (ref >60)
Glucose, Bld: 163 mg/dL — ABNORMAL HIGH (ref 70–99)
Potassium: 3.8 mmol/L (ref 3.5–5.1)
Sodium: 129 mmol/L — ABNORMAL LOW (ref 135–145)
Total Bilirubin: 0.4 mg/dL (ref 0.0–1.2)
Total Protein: 6.9 g/dL (ref 6.5–8.1)

## 2023-04-16 LAB — CBC WITH DIFFERENTIAL/PLATELET
Abs Immature Granulocytes: 0.08 10*3/uL — ABNORMAL HIGH (ref 0.00–0.07)
Basophils Absolute: 0 10*3/uL (ref 0.0–0.1)
Basophils Relative: 0 %
Eosinophils Absolute: 0 10*3/uL (ref 0.0–0.5)
Eosinophils Relative: 0 %
HCT: 34 % — ABNORMAL LOW (ref 36.0–46.0)
Hemoglobin: 11.7 g/dL — ABNORMAL LOW (ref 12.0–15.0)
Immature Granulocytes: 1 %
Lymphocytes Relative: 1 %
Lymphs Abs: 0.1 10*3/uL — ABNORMAL LOW (ref 0.7–4.0)
MCH: 32.1 pg (ref 26.0–34.0)
MCHC: 34.4 g/dL (ref 30.0–36.0)
MCV: 93.4 fL (ref 80.0–100.0)
Monocytes Absolute: 0.7 10*3/uL (ref 0.1–1.0)
Monocytes Relative: 7 %
Neutro Abs: 9 10*3/uL — ABNORMAL HIGH (ref 1.7–7.7)
Neutrophils Relative %: 91 %
Platelets: 240 10*3/uL (ref 150–400)
RBC: 3.64 MIL/uL — ABNORMAL LOW (ref 3.87–5.11)
RDW: 13.1 % (ref 11.5–15.5)
WBC: 10 10*3/uL (ref 4.0–10.5)
nRBC: 0 % (ref 0.0–0.2)

## 2023-04-16 LAB — RESP PANEL BY RT-PCR (RSV, FLU A&B, COVID)  RVPGX2
Influenza A by PCR: POSITIVE — AB
Influenza B by PCR: NEGATIVE
Resp Syncytial Virus by PCR: NEGATIVE
SARS Coronavirus 2 by RT PCR: NEGATIVE

## 2023-04-16 LAB — TSH: TSH: 0.557 u[IU]/mL (ref 0.350–4.500)

## 2023-04-16 LAB — BRAIN NATRIURETIC PEPTIDE: B Natriuretic Peptide: 122 pg/mL — ABNORMAL HIGH (ref 0.0–100.0)

## 2023-04-16 MED ORDER — ONDANSETRON HCL 4 MG PO TABS: 4.0000 mg | ORAL_TABLET | Freq: Four times a day (QID) | ORAL | Status: DC | PRN

## 2023-04-16 MED ORDER — ONDANSETRON HCL 4 MG/2ML IJ SOLN: 4.0000 mg | Freq: Four times a day (QID) | INTRAMUSCULAR | Status: DC | PRN

## 2023-04-16 MED ORDER — BUDESONIDE 0.5 MG/2ML IN SUSP
0.5000 mg | Freq: Two times a day (BID) | RESPIRATORY_TRACT | Status: DC
  Administered 2023-04-16 – 2023-04-21 (×10): 0.5 mg via RESPIRATORY_TRACT
  Filled 2023-04-16 (×11): qty 2

## 2023-04-16 MED ORDER — DM-GUAIFENESIN ER 30-600 MG PO TB12
1.0000 | ORAL_TABLET | Freq: Two times a day (BID) | ORAL | Status: DC
  Administered 2023-04-16 – 2023-04-21 (×10): 1 via ORAL
  Filled 2023-04-16 (×10): qty 1

## 2023-04-16 MED ORDER — SODIUM CHLORIDE 0.9% FLUSH
3.0000 mL | Freq: Two times a day (BID) | INTRAVENOUS | Status: DC
  Administered 2023-04-16 – 2023-04-21 (×11): 3 mL via INTRAVENOUS

## 2023-04-16 MED ORDER — OSELTAMIVIR PHOSPHATE 30 MG PO CAPS
30.0000 mg | ORAL_CAPSULE | Freq: Two times a day (BID) | ORAL | Status: DC
  Administered 2023-04-16 – 2023-04-21 (×10): 30 mg via ORAL
  Filled 2023-04-16 (×10): qty 1

## 2023-04-16 MED ORDER — CITALOPRAM HYDROBROMIDE 20 MG PO TABS
20.0000 mg | ORAL_TABLET | Freq: Every day | ORAL | Status: DC
  Administered 2023-04-17 – 2023-04-21 (×5): 20 mg via ORAL
  Filled 2023-04-16 (×6): qty 1

## 2023-04-16 MED ORDER — PANTOPRAZOLE SODIUM 40 MG PO TBEC
40.0000 mg | DELAYED_RELEASE_TABLET | Freq: Every day | ORAL | Status: DC
  Administered 2023-04-16 – 2023-04-21 (×6): 40 mg via ORAL
  Filled 2023-04-16 (×6): qty 1

## 2023-04-16 MED ORDER — ENOXAPARIN SODIUM 40 MG/0.4ML IJ SOSY
40.0000 mg | PREFILLED_SYRINGE | INTRAMUSCULAR | Status: DC
  Administered 2023-04-16 – 2023-04-20 (×5): 40 mg via SUBCUTANEOUS
  Filled 2023-04-16 (×5): qty 0.4

## 2023-04-16 MED ORDER — OSELTAMIVIR PHOSPHATE 30 MG PO CAPS: 30.0000 mg | ORAL_CAPSULE | Freq: Two times a day (BID) | ORAL | Status: DC

## 2023-04-16 MED ORDER — TRAMADOL HCL 50 MG PO TABS
50.0000 mg | ORAL_TABLET | Freq: Three times a day (TID) | ORAL | Status: DC | PRN
  Administered 2023-04-16 – 2023-04-18 (×4): 50 mg via ORAL
  Filled 2023-04-16 (×4): qty 1

## 2023-04-16 MED ORDER — ACETAMINOPHEN 650 MG RE SUPP: 650.0000 mg | Freq: Four times a day (QID) | RECTAL | Status: DC | PRN

## 2023-04-16 MED ORDER — IPRATROPIUM-ALBUTEROL 0.5-2.5 (3) MG/3ML IN SOLN
3.0000 mL | Freq: Three times a day (TID) | RESPIRATORY_TRACT | Status: DC
  Administered 2023-04-16: 3 mL via RESPIRATORY_TRACT

## 2023-04-16 MED ORDER — ALBUTEROL SULFATE (2.5 MG/3ML) 0.083% IN NEBU
2.5000 mg | INHALATION_SOLUTION | RESPIRATORY_TRACT | Status: DC | PRN
  Administered 2023-04-16 – 2023-04-19 (×2): 2.5 mg via RESPIRATORY_TRACT
  Filled 2023-04-16 (×2): qty 3

## 2023-04-16 MED ORDER — AMLODIPINE BESYLATE 5 MG PO TABS
10.0000 mg | ORAL_TABLET | Freq: Every day | ORAL | Status: DC
  Administered 2023-04-16 – 2023-04-21 (×6): 10 mg via ORAL
  Filled 2023-04-16 (×6): qty 2

## 2023-04-16 MED ORDER — IPRATROPIUM-ALBUTEROL 0.5-2.5 (3) MG/3ML IN SOLN
3.0000 mL | RESPIRATORY_TRACT | Status: AC
  Administered 2023-04-16 (×3): 3 mL via RESPIRATORY_TRACT
  Filled 2023-04-16: qty 9

## 2023-04-16 MED ORDER — LEVOTHYROXINE SODIUM 100 MCG PO TABS
100.0000 ug | ORAL_TABLET | Freq: Every day | ORAL | Status: DC
  Administered 2023-04-17: 100 ug via ORAL
  Filled 2023-04-16: qty 1

## 2023-04-16 MED ORDER — ACETAMINOPHEN 325 MG PO TABS
650.0000 mg | ORAL_TABLET | Freq: Four times a day (QID) | ORAL | Status: DC | PRN
  Filled 2023-04-16: qty 2

## 2023-04-16 MED ORDER — ALBUTEROL SULFATE HFA 108 (90 BASE) MCG/ACT IN AERS: 2.0000 | INHALATION_SPRAY | RESPIRATORY_TRACT | Status: DC | PRN

## 2023-04-16 MED ORDER — SODIUM CHLORIDE 0.9% FLUSH: 3.0000 mL | INTRAVENOUS | Status: DC | PRN

## 2023-04-16 MED ORDER — ARFORMOTEROL TARTRATE 15 MCG/2ML IN NEBU
15.0000 ug | INHALATION_SOLUTION | Freq: Two times a day (BID) | RESPIRATORY_TRACT | Status: DC
  Administered 2023-04-16 – 2023-04-21 (×10): 15 ug via RESPIRATORY_TRACT
  Filled 2023-04-16 (×11): qty 2

## 2023-04-16 MED ORDER — ALBUTEROL SULFATE (2.5 MG/3ML) 0.083% IN NEBU
2.5000 mg | INHALATION_SOLUTION | RESPIRATORY_TRACT | Status: DC | PRN
  Administered 2023-04-16: 2.5 mg via RESPIRATORY_TRACT
  Filled 2023-04-16: qty 3

## 2023-04-16 MED ORDER — ALPRAZOLAM 0.25 MG PO TABS
0.2500 mg | ORAL_TABLET | Freq: Two times a day (BID) | ORAL | Status: DC | PRN
  Administered 2023-04-16 – 2023-04-20 (×5): 0.25 mg via ORAL
  Filled 2023-04-16 (×5): qty 1

## 2023-04-16 MED ORDER — METHYLPREDNISOLONE SODIUM SUCC 125 MG IJ SOLR
125.0000 mg | Freq: Once | INTRAMUSCULAR | Status: AC
  Administered 2023-04-16: 125 mg via INTRAVENOUS
  Filled 2023-04-16: qty 2

## 2023-04-16 MED ORDER — IPRATROPIUM-ALBUTEROL 0.5-2.5 (3) MG/3ML IN SOLN
3.0000 mL | RESPIRATORY_TRACT | Status: DC
  Administered 2023-04-16 – 2023-04-21 (×27): 3 mL via RESPIRATORY_TRACT
  Filled 2023-04-16 (×9): qty 3
  Filled 2023-04-16: qty 9
  Filled 2023-04-16 (×18): qty 3

## 2023-04-16 MED ORDER — SODIUM CHLORIDE 0.9 % IV SOLN: 250.0000 mL | INTRAVENOUS | Status: AC | PRN

## 2023-04-16 MED ORDER — METHYLPREDNISOLONE SODIUM SUCC 40 MG IJ SOLR
40.0000 mg | Freq: Two times a day (BID) | INTRAMUSCULAR | Status: DC
  Administered 2023-04-16 – 2023-04-18 (×4): 40 mg via INTRAVENOUS
  Filled 2023-04-16 (×4): qty 1

## 2023-04-16 MED ORDER — LOSARTAN POTASSIUM 50 MG PO TABS
100.0000 mg | ORAL_TABLET | Freq: Every day | ORAL | Status: DC
  Administered 2023-04-17 – 2023-04-21 (×5): 100 mg via ORAL
  Filled 2023-04-16 (×2): qty 2
  Filled 2023-04-16: qty 4
  Filled 2023-04-16 (×3): qty 2

## 2023-04-16 NOTE — ED Triage Notes (Signed)
Pt arrived via POV frmo home c/o worsening SOB. Pt reports a cough started yesterday. Pt reports checking her O2 Sats at home and had a reading 83% on room air. Pt reports a fever of 102F at home last night.

## 2023-04-16 NOTE — H&P (Signed)
History and Physical    Patient: Alexis Ortega ZOX:[id number] DOB: 12-Jun-1945 DOA: 04/16/2023 DOS: the patient was seen and examined on 04/16/2023 PCP: Toma Deiters, MD  Patient coming from: Home  Chief Complaint:  Chief Complaint  Patient presents with   Shortness of Breath   HPI: Alexis Ortega is a 78 y.o. female with medical history significant of COPD/asthma, GERD, hypertension, hypothyroidism and chronic pain due to fibromyalgia and degenerative diseases/laminectomy; who presented to the hospital secondary to increase dry coughing spells, shortness of breath, chills and general malaise.  Symptoms have been present for the last 3 days or so and worsening. Patient reports no chest pain, no abdominal pain, no focal weakness, no dysuria, no hematuria, no melena, no hematochezia, no sick contacts or any other complaints.  Of note, patient reporting the use of bronchodilators at home not improving pressure when the sensation and increased expiratory wheezing process.  Patient also expressed checking her oxygen saturation at home demonstrating mid 80s saturation on room air.   She does not use oxygen at baseline.  Workup in the ED demonstrating positive influenza A infection, no acute infiltrates in her chest x-ray, normal WBCs and overall normal electrolytes, LFTs and renal function.  Positive hyperglycemia appreciated with mild pseudohyponatremia due to CBGs in the 1 60-180 range.  Patient received Tamiflu, steroids, bronchodilator management and was placed on oxygen supplementation (4 L through nasal cannula) in order to maintain saturation above 90%.   Review of Systems: As mentioned in the history of present illness. All other systems reviewed and are negative. Past Medical History:  Diagnosis Date   Arthritis    Asthma    Depression    Fibromyalgia    GERD (gastroesophageal reflux disease)    Hypertension    Hypothyroidism    Thyroid disease    Past Surgical History:   Procedure Laterality Date   ABDOMINAL HYSTERECTOMY     BACK SURGERY     BIOPSY  03/29/2020   Procedure: BIOPSY;  Surgeon: Dolores Frame, MD;  Location: [facility];  Service: Gastroenterology;;   CARPAL TUNNEL RELEASE Right    CHOLECYSTECTOMY     CLOSED REDUCTION RADIAL SHAFT Left 06/29/2022   Procedure: ATTTEMPTED CLOSED REDUCTION RADIAL SHAFT;  Surgeon: Oliver Barre, MD;  Location: [facility];  Service: Orthopedics;  Laterality: Left;  Closed reduction left distal radius fracture   ESOPHAGEAL DILATION N/A 03/29/2020   Procedure: ESOPHAGEAL DILATION;  Surgeon: Dolores Frame, MD;  Location: [facility];  Service: Gastroenterology;  Laterality: N/A;   ESOPHAGOGASTRODUODENOSCOPY (EGD) WITH PROPOFOL N/A 03/29/2020   Procedure: ESOPHAGOGASTRODUODENOSCOPY (EGD) WITH PROPOFOL;  Surgeon: Dolores Frame, MD;  Location: [facility];  Service: Gastroenterology;  Laterality: N/A;  9:00   EYE SURGERY     cataract with lens implant   KNEE ARTHROSCOPY Left    LEFT HEART CATHETERIZATION WITH CORONARY ANGIOGRAM N/A 10/02/2013   Procedure: LEFT HEART CATHETERIZATION WITH CORONARY ANGIOGRAM;  Surgeon: Runell Gess, MD;  Location: [facility] C[facility] LAB;  Service: Cardiovascular;  Laterality: N/A;   NECK SURGERY     PERCUTANEOUS PINNING Left 06/29/2022   Procedure: PERCUTANEOUS PINNING OF DISTAL RADIUS;  Surgeon: Oliver Barre, MD;  Location: [facility];  Service: Orthopedics;  Laterality: Left;  Closed reduction and pinning of distal radius fracture   TOTAL KNEE ARTHROPLASTY Left 06/01/2012   Procedure: LEFT TOTAL KNEE ARTHROPLASTY;  Surgeon: Loreta Ave, MD;  Location: [facility] OR;  Service: Orthopedics;  Laterality: Left;  TUBAL LIGATION     Social History:  reports that she quit smoking about 10 years ago. Her smoking use included cigarettes. She started smoking about 50 years ago. She has a 20 pack-year smoking history. She has been exposed to tobacco smoke. She has never used  smokeless tobacco. She reports that she does not currently use drugs. She reports that she does not drink alcohol.  Allergies  Allergen Reactions   Cefuroxime Shortness Of Breath    Messed with my breathing real bad   Gabapentin Itching   Diclofenac Rash   Tramadol Itching    Family History  Problem Relation Age of Onset   Allergies Son    Asthma Daughter     Prior to Admission medications   Medication Sig Start Date End Date Taking? Authorizing Provider  doxycycline (VIBRAMYCIN) 100 MG capsule Take 100 mg by mouth 2 (two) times daily. 04/15/23  Yes [provider]  DULoxetine (CYMBALTA) 20 MG capsule Take 20 mg by mouth daily. 03/22/23  Yes [provider]  levothyroxine (SYNTHROID) 88 MCG tablet Take 88 mcg by mouth daily. 03/22/23  Yes [provider]  pravastatin (PRAVACHOL) 40 MG tablet Take 40 mg by mouth daily. 03/22/23  Yes [provider]  predniSONE (DELTASONE) 5 MG tablet Take 5 mg by mouth daily. 04/15/23  Yes [provider]  acetaminophen (TYLENOL) 500 MG tablet Take 500 mg by mouth every 6 (six) hours as needed for moderate pain or mild pain.    [provider]  ALPRAZolam Prudy Feeler) 0.5 MG tablet Take 0.5 mg by mouth at bedtime.    [provider]  amLODipine (NORVASC) 10 MG tablet Take 10 mg by mouth daily. 03/30/14   [provider]  buprenorphine-naloxone (SUBOXONE) 2-0.5 mg SUBL SL tablet Place under the tongue daily. One daily Patient not taking: Reported on 01/04/2023    [provider]  citalopram (CELEXA) 20 MG tablet Take 20 mg by mouth daily.    [provider]  hydrocortisone (ANUSOL-HC) 2.5 % rectal cream Place 1 Application rectally 4 (four) times daily. 01/04/23   Carlan, Chelsea L, NP  losartan (COZAAR) 100 MG tablet Take 100 mg by mouth daily.    [provider]  Multiple Vitamin (MULTIVITAMIN WITH MINERALS) TABS tablet Take 1 tablet by mouth daily.    [provider]  ondansetron (ZOFRAN ODT) 4 MG disintegrating tablet 4mg  ODT q4 hours prn nausea/vomit Patient taking differently: Take 4 mg by mouth every 4 (four) hours as needed for nausea or vomiting. 4mg  ODT q4 hours prn nausea/vomit 03/26/19   Bethann Berkshire, MD  PROAIR HFA 108 (90 BASE) MCG/ACT inhaler Inhale 2 puffs into the lungs every 4 (four) hours as needed for wheezing.  08/24/13   [provider]  Zinc 50 MG CAPS Take 50 mg by mouth daily.    [provider]    Physical Exam: Vitals:   04/16/23 1300 04/16/23 1315 04/16/23 1416 04/16/23 1419  BP: (!) 139/53 (!) 122/45  (!) 154/57  Pulse: 97 94  96  Resp: (!) 22 20  20   Temp:    98.3 F (36.8 C)  TempSrc:    Oral  SpO2: 93% 91% 93% 95%  Weight:      Height:       General exam: Alert, awake, oriented x 3; demonstrating difficulty speaking in full sentences and having tachypnea.  No chest pain. Respiratory system: Diffuse bilateral expiratory wheezing and tachypnea; 4 L nasal cannula supplementation  in place.  No using accessory muscle.  Positive tachypnea (respiratory rate 24-25 at time of admission). Cardiovascular system: Rate controlled, no rubs, no gallops, no JVD. Gastrointestinal system: Abdomen is nondistended, soft and nontender.  Positive bowel sounds. Central nervous system: No focal neurological deficits. Extremities: No cyanosis or clubbing. Skin: No petechiae. Psychiatry: Judgement and insight appear normal.  Slightly anxious on exam.  Data Reviewed: Respiratory panel: Positive influenza A by PCR CBC: WBC 10.0, hemoglobin 11.7 and platelet count of 40K Comprehensive metabolic panel: Sodium 129, potassium 3.8, chloride 96, bicarb 20, glucose 163, BUN 17, creatinine 0.85, normal LFTs and a GFR>60 Chest x-ray: No acute cardiopulmonary process appreciated.  Assessment and Plan: 1-acute respiratory failure with hypoxia -Patient presented with tachypnea, oxygen saturation in the mid 80s on room  air and requiring 4 L nasal cannula supplementation on presentation -Positive influenza A and COPD exacerbation secondary to patient's respiratory failure. -Continue treatment with Tamiflu -Continue IV steroids, bronchodilators, mucolytic's, flutter valve and as needed antitussive medications. -Patient has been started on Brovana and Pulmicort. -Continue supportive care and maintain adequate hydration -Wean off oxygen supplementation as tolerated.  2-hypothyroidism -Update TSH -Continue Synthroid.  3-hypertension -Continue home antihypertensive regimen (Cozaar and Norvasc) -Follow vital signs. -Heart healthy/low-sodium diet discussed with patient.  4-gastroesophageal reflux disease -Continue PPI  5-chronic pain syndrome Fibromyalgia -Continue treatment with Tylenol and as needed tramadol.  6-depression/anxiety -Resume home Celexa and as needed Xanax -No suicidal ideation or hallucinations currently   Advance Care Planning:   Code Status: Full Code   Consults: None  Family Communication: Daughter at bedside.  Severity of Illness: The appropriate patient status for this patient is INPATIENT. Inpatient status is judged to be reasonable and necessary in order to provide the required intensity of service to ensure the patient's safety. The patient's presenting symptoms, physical exam findings, and initial radiographic and laboratory data in the context of their chronic comorbidities is felt to place them at high risk for further clinical deterioration. Furthermore, it is not anticipated that the patient will be medically stable for discharge from the hospital within 2 midnights of admission.   * I certify that at the point of admission it is my clinical judgment that the patient will require inpatient hospital care spanning beyond 2 midnights from the point of admission due to high intensity of service, high risk for further deterioration and high frequency of surveillance  required.*  Author: Vassie Loll, MD 04/16/2023 5:40 PM  For on call review [email or web address].

## 2023-04-16 NOTE — ED Notes (Signed)
Pt placed on 4L Nasal Cannula in Triage and O2 Sats improved to 91%.

## 2023-04-16 NOTE — ED Provider Notes (Incomplete)
[place] EMERGENCY DEPARTMENT AT [facility] Provider Note   CSN: [id number] Arrival date & time: 04/16/23  1138     History {Add pertinent medical, surgical, social history, OB history to HPI:1} Chief Complaint  Patient presents with   Shortness of Breath    Alexis Ortega is a 78 y.o. female with PMH as listed below who presents POV frmo home c/o worsening SOB. Started coughing yesterday morning and worsening SOB/cough since then. Had fever 102F that resolved w/ tylenol. Nonproductive cough. CP w/ coughing. Has some COPD/asthma, and albuterol inhaler at home helped some but not a lot. Denies nausea/vomiting, diarrhea/constipation, leg swelling. Pt reports checking her O2 Sats at home and had a reading 83% on room air. Pt reports a fever of 102F at home last night. Pt placed on 4L Nasal Cannula in Triage and O2 Sats improved to 91%.   Past Medical History:  Diagnosis Date   Arthritis    Asthma    Depression    Fibromyalgia    GERD (gastroesophageal reflux disease)    Hypertension    Hypothyroidism    Thyroid disease        Home Medications Prior to Admission medications   Medication Sig Start Date End Date Taking? Authorizing Provider  acetaminophen (TYLENOL) 500 MG tablet Take 500 mg by mouth every 6 (six) hours as needed for moderate pain or mild pain.    [provider]  ALPRAZolam Prudy Feeler) 0.5 MG tablet Take 0.5 mg by mouth at bedtime.    [provider]  amLODipine (NORVASC) 10 MG tablet Take 10 mg by mouth daily. 03/30/14   [provider]  buprenorphine-naloxone (SUBOXONE) 2-0.5 mg SUBL SL tablet Place under the tongue daily. One daily Patient not taking: Reported on 01/04/2023    [provider]  citalopram (CELEXA) 20 MG tablet Take 20 mg by mouth daily.    [provider]  hydrocortisone (ANUSOL-HC) 2.5 % rectal cream Place 1 Application rectally 4 (four) times daily. 01/04/23   Carlan, Jeral Pinch, NP   levothyroxine (SYNTHROID) 100 MCG tablet Take 100 mcg by mouth daily before breakfast.     [provider]  losartan (COZAAR) 100 MG tablet Take 100 mg by mouth daily.    [provider]  Multiple Vitamin (MULTIVITAMIN WITH MINERALS) TABS tablet Take 1 tablet by mouth daily.    [provider]  ondansetron (ZOFRAN ODT) 4 MG disintegrating tablet 4mg  ODT q4 hours prn nausea/vomit Patient taking differently: Take 4 mg by mouth every 4 (four) hours as needed for nausea or vomiting. 4mg  ODT q4 hours prn nausea/vomit 03/26/19   Bethann Berkshire, MD  PROAIR HFA 108 (90 BASE) MCG/ACT inhaler Inhale 2 puffs into the lungs every 4 (four) hours as needed for wheezing.  08/24/13   [provider]  Zinc 50 MG CAPS Take 50 mg by mouth daily.    [provider]      Allergies    Cefuroxime, Diclofenac, and Tramadol    Review of Systems   Review of Systems A 10 point review of systems was performed and is negative unless otherwise reported in HPI.  Physical Exam Updated Vital Signs BP 137/62 (BP Location: Right Arm)   Pulse 85   Temp 98.8 F (37.1 C) (Oral)   Resp (!) 30   Ht 5\' 4"  (1.626 m)   Wt 69 kg   SpO2 90%   BMI 26.11 kg/m  Physical Exam General: Normal appearing female, lying in  bed.  HEENT: PERRLA, Sclera anicteric, MMM, trachea midline.  Cardiology: RRR, no murmurs/rubs/gallops. BL radial and DP pulses equal bilaterally.  Resp: Mild respiratory distress with tight lung sounds, poor air movement. Inspiratory and expiratory wheezes, increased work of breathing. Able to talk but short of breath after phrases. Grunting with expiration. Abd: Soft, non-tender, non-distended. No rebound tenderness or guarding.  GU: Deferred. MSK: No peripheral edema or signs of trauma. Extremities without deformity or TTP.  Skin: warm, dry.  Neuro: A&Ox4, CNs II-XII grossly intact. MAEs. Sensation grossly intact.  Psych: Normal mood and affect.   ED Results /  Procedures / Treatments   Labs (all labs ordered are listed, but only abnormal results are displayed) Labs Reviewed  RESP PANEL BY RT-PCR (RSV, FLU A&B, COVID)  RVPGX2  CBC WITH DIFFERENTIAL/PLATELET  COMPREHENSIVE METABOLIC PANEL  BRAIN NATRIURETIC PEPTIDE    EKG None  Radiology No results found.  Procedures Procedures  {Document cardiac monitor, telemetry assessment procedure when appropriate:1}  Medications Ordered in ED Medications  ipratropium-albuterol (DUONEB) 0.5-2.5 (3) MG/3ML nebulizer solution 3 mL (has no administration in time range)  methylPREDNISolone sodium succinate (SOLU-MEDROL) 125 mg/2 mL injection 125 mg (has no administration in time range)    ED Course/ Medical Decision Making/ A&P                          Medical Decision Making Amount and/or Complexity of Data Reviewed Labs: ordered. Radiology: ordered.  Risk Prescription drug management.    This patient presents to the ED for concern of dyspnea, fever; this involves an extensive number of treatment options, and is a complaint that carries with it a high risk of complications and morbidity.  I considered the following differential and admission for this acute, potentially life threatening condition.   MDM:    *** DDX for dyspnea includes but is not limited to:  COPD exacerbation brought on by respiratory virus***. CXR doesn't show pneumonia, pleural effusion, pulm edema.   Cardiac- CHF, Myocardial Ischemia, Valvular heart disease, Arrythmia, Cardiac tamponade   Respiratory - Pneumonia / atelectasis / pulmonary effusion / cavitary lung disease, Pneumothorax, COPD/ reactive airway disease, PE, ARDS   Other - Sepsis, Anemia        Labs: I Ordered, and personally interpreted labs.  The pertinent results include:  those listed above  Imaging Studies ordered: I ordered imaging studies including CXR I independently visualized and interpreted imaging. I agree with the radiologist  interpretation  Additional history obtained from chart review, family at bedside.    Cardiac Monitoring: The patient was maintained on a cardiac monitor.  I personally viewed and interpreted the cardiac monitored which showed an underlying rhythm of: NSR  Reevaluation: After the interventions noted above, I reevaluated the patient and found that they have :improved  Social Determinants of Health: Lives independently  Disposition:  ***  Co morbidities that complicate the patient evaluation  Past Medical History:  Diagnosis Date   Arthritis    Asthma    Depression    Fibromyalgia    GERD (gastroesophageal reflux disease)    Hypertension    Hypothyroidism    Thyroid disease      Medicines Meds ordered this encounter  Medications   DISCONTD: albuterol (VENTOLIN HFA) 108 (90 Base) MCG/ACT inhaler 2 puff   ipratropium-albuterol (DUONEB) 0.5-2.5 (3) MG/3ML nebulizer solution 3 mL   methylPREDNISolone sodium succinate (SOLU-MEDROL) 125 mg/2 mL injection 125 mg    I have reviewed  the patients home medicines and have made adjustments as needed  Problem List / ED Course: Problem List Items Addressed This Visit   None        {Document critical care time when appropriate:1} {Document review of labs and clinical decision tools ie heart score, Chads2Vasc2 etc:1}  {Document your independent review of radiology images, and any outside records:1} {Document your discussion with family members, caretakers, and with consultants:1} {Document social determinants of health affecting pt's care:1} {Document your decision making why or why not admission, treatments were needed:1}  This note was created using dictation software, which may contain spelling or grammatical errors.

## 2023-04-16 NOTE — Progress Notes (Signed)
Patient was seen for SOB. She was admitted this afternoon for influenza A with COPD exacerbation and acute hypoxic respiratory failure.   She is dyspneic with speech and has labored respirations. She is receiving appropriate care for flu and acute COPD exacerbation. Could use BiPAP as needed for increased WOB. Plan to transfer to stepdown unit for closer monitoring and BiPAP as needed.

## 2023-04-17 ENCOUNTER — Encounter (HOSPITAL_COMMUNITY): Payer: Self-pay | Admitting: Internal Medicine

## 2023-04-17 DIAGNOSIS — J9601 Acute respiratory failure with hypoxia: Secondary | ICD-10-CM

## 2023-04-17 DIAGNOSIS — J101 Influenza due to other identified influenza virus with other respiratory manifestations: Secondary | ICD-10-CM | POA: Diagnosis present

## 2023-04-17 DIAGNOSIS — J441 Chronic obstructive pulmonary disease with (acute) exacerbation: Secondary | ICD-10-CM | POA: Diagnosis not present

## 2023-04-17 LAB — GLUCOSE, CAPILLARY
Glucose-Capillary: 129 mg/dL — ABNORMAL HIGH (ref 70–99)
Glucose-Capillary: 140 mg/dL — ABNORMAL HIGH (ref 70–99)

## 2023-04-17 LAB — COMPREHENSIVE METABOLIC PANEL
ALT: 31 U/L (ref 0–44)
AST: 64 U/L — ABNORMAL HIGH (ref 15–41)
Albumin: 3.7 g/dL (ref 3.5–5.0)
Alkaline Phosphatase: 42 U/L (ref 38–126)
Anion gap: 8 (ref 5–15)
BUN: 21 mg/dL (ref 8–23)
CO2: 22 mmol/L (ref 22–32)
Calcium: 8.9 mg/dL (ref 8.9–10.3)
Chloride: 94 mmol/L — ABNORMAL LOW (ref 98–111)
Creatinine, Ser: 0.76 mg/dL (ref 0.44–1.00)
GFR, Estimated: >60 mL/min (ref >60)
Glucose, Bld: 136 mg/dL — ABNORMAL HIGH (ref 70–99)
Potassium: 3.7 mmol/L (ref 3.5–5.1)
Sodium: 124 mmol/L — ABNORMAL LOW (ref 135–145)
Total Bilirubin: 0.4 mg/dL (ref 0.0–1.2)
Total Protein: 6.6 g/dL (ref 6.5–8.1)

## 2023-04-17 LAB — MAGNESIUM: Magnesium: 2 mg/dL (ref 1.7–2.4)

## 2023-04-17 LAB — CBC
HCT: 32.6 % — ABNORMAL LOW (ref 36.0–46.0)
Hemoglobin: 11.1 g/dL — ABNORMAL LOW (ref 12.0–15.0)
MCH: 31.4 pg (ref 26.0–34.0)
MCHC: 34 g/dL (ref 30.0–36.0)
MCV: 92.1 fL (ref 80.0–100.0)
Platelets: 237 10*3/uL (ref 150–400)
RBC: 3.54 MIL/uL — ABNORMAL LOW (ref 3.87–5.11)
RDW: 13 % (ref 11.5–15.5)
WBC: 14.5 10*3/uL — ABNORMAL HIGH (ref 4.0–10.5)
nRBC: 0 % (ref 0.0–0.2)

## 2023-04-17 LAB — MRSA NEXT GEN BY PCR, NASAL: MRSA by PCR Next Gen: NOT DETECTED

## 2023-04-17 LAB — HEMOGLOBIN A1C
Hgb A1c MFr Bld: 5.9 % — ABNORMAL HIGH (ref 4.8–5.6)
Mean Plasma Glucose: 122.63 mg/dL

## 2023-04-17 MED ORDER — LEVOTHYROXINE SODIUM 88 MCG PO TABS
88.0000 ug | ORAL_TABLET | Freq: Every day | ORAL | Status: DC
  Administered 2023-04-18 – 2023-04-21 (×4): 88 ug via ORAL
  Filled 2023-04-17 (×4): qty 1

## 2023-04-17 MED ORDER — SODIUM CHLORIDE 0.9 % IV BOLUS
500.0000 mL | Freq: Once | INTRAVENOUS | Status: AC
  Administered 2023-04-17: 500 mL via INTRAVENOUS

## 2023-04-17 MED ORDER — CHLORHEXIDINE GLUCONATE CLOTH 2 % EX PADS
6.0000 | MEDICATED_PAD | Freq: Every day | CUTANEOUS | Status: DC
  Administered 2023-04-17 – 2023-04-18 (×2): 6 via TOPICAL

## 2023-04-17 MED ORDER — ORAL CARE MOUTH RINSE
15.0000 mL | OROMUCOSAL | Status: DC
  Administered 2023-04-17 – 2023-04-18 (×6): 15 mL via OROMUCOSAL

## 2023-04-17 MED ORDER — ORAL CARE MOUTH RINSE: 15.0000 mL | OROMUCOSAL | Status: DC | PRN

## 2023-04-17 MED ORDER — PRAVASTATIN SODIUM 40 MG PO TABS
40.0000 mg | ORAL_TABLET | Freq: Every day | ORAL | Status: DC
  Administered 2023-04-17 – 2023-04-20 (×4): 40 mg via ORAL
  Filled 2023-04-17 (×4): qty 1

## 2023-04-17 MED ORDER — MELATONIN 3 MG PO TABS
6.0000 mg | ORAL_TABLET | Freq: Once | ORAL | Status: AC
  Administered 2023-04-17: 6 mg via ORAL
  Filled 2023-04-17: qty 2

## 2023-04-17 MED ORDER — INSULIN ASPART 100 UNIT/ML IJ SOLN
0.0000 [IU] | Freq: Three times a day (TID) | INTRAMUSCULAR | Status: DC
  Administered 2023-04-17 – 2023-04-18 (×2): 2 [IU] via SUBCUTANEOUS
  Administered 2023-04-19 – 2023-04-20 (×2): 3 [IU] via SUBCUTANEOUS

## 2023-04-17 NOTE — Progress Notes (Signed)
PROGRESS NOTE   Alexis Ortega  WGN:[id number] DOB: Apr 01, 1945 DOA: 04/16/2023 PCP: Toma Deiters, MD   Chief Complaint  Patient presents with   Shortness of Breath   Level of care: Stepdown  Brief Admission History:  78 y.o. female with medical history significant of COPD/asthma, GERD, hypertension, hypothyroidism and chronic pain due to fibromyalgia and degenerative diseases/laminectomy; who presented to the hospital secondary to increase dry coughing spells, shortness of breath, chills and general malaise.  Symptoms have been present for the last 3 days or so and worsening. patient reporting the use of bronchodilators at home not improving pressure when the sensation and increased expiratory wheezing process.  Patient also expressed checking her oxygen saturation at home demonstrating mid 80s saturation on room air.  She does not use oxygen at baseline.  positive influenza A infection, no acute infiltrates in her chest x-ray, normal WBCs and overall normal electrolytes, LFTs and renal function.  Patient received Tamiflu, steroids, bronchodilator management and was placed on oxygen supplementation.     Assessment and Plan:  Acute respiratory failure with hypoxia -Patient presented with tachypnea, oxygen saturation in the mid 80s on room air and requiring 4 L nasal cannula supplementation on presentation -pt transferred to stepdown ICU due to acute worsening dyspnea overnight and bipap started  -Positive influenza A and COPD exacerbation secondary to patient's respiratory failure. -Continue treatment with Tamiflu -Continue IV steroids, bronchodilators, mucolytics, flutter valve and as needed antitussive medications. -continue Brovana and Pulmicort. -Continue supportive care and maintain adequate hydration -Wean off oxygen supplementation as tolerated.   hypothyroidism -TSH -->0.557 -Continue home dose levothyroxine  Hyponatremia -suspect from volume depletion -gently hydrate   -recheck in Am    hypertension -Continue home antihypertensive regimen (Cozaar and Norvasc) -Follow vital signs. -Heart healthy/low-sodium diet discussed with patient.   gastroesophageal reflux disease -Continue PPI   chronic pain syndrome Fibromyalgia -Continue treatment with Tylenol and as needed tramadol.   depression/anxiety -Resumed home Celexa and as needed Xanax -No suicidal ideation or hallucinations currently   DVT prophylaxis: enoxaparin Code Status: Full  Family Communication:  Disposition: anticipate home    Consultants:   Procedures:   Antimicrobials:    Subjective: Pt says that her breathing is a little less labored today, she remains on heated high flow oxygen but they are starting to wean.   Objective: Vitals:   04/17/23 1100 04/17/23 1140 04/17/23 1143 04/17/23 1200  BP: (!) 153/52   (!) 151/49  Pulse: 81  90 81  Resp: (!) 23  (!) 24 (!) 24  Temp:  98.5 F (36.9 C)    TempSrc:  Oral    SpO2: 95%  97% 97%  Weight:      Height:        Intake/Output Summary (Last 24 hours) at 04/17/2023 1300 Last data filed at 04/17/2023 0800 Gross per 24 hour  Intake 440 ml  Output 700 ml  Net -260 ml   Filed Weights   04/16/23 1144 04/17/23 0027  Weight: 69 kg 71.3 kg   Examination:  General exam: Appears calm and comfortable  Respiratory system: moderate increased work of breathing bilateral expiratory wheezing heard.  Cardiovascular system: normal S1 & S2 heard. No JVD, murmurs, rubs, gallops or clicks. No pedal edema. Gastrointestinal system: Abdomen is nondistended, soft and nontender. No organomegaly or masses felt. Normal bowel sounds heard. Central nervous system: Alert and oriented. No focal neurological deficits. Extremities: Symmetric 5 x 5 power. Skin: No rashes, lesions or ulcers. Psychiatry: Judgement  and insight appear normal. Mood & affect appropriate.   Data Reviewed: I have personally reviewed following labs and imaging  studies  CBC: Recent Labs  Lab 04/16/23 1201 04/17/23 0602  WBC 10.0 14.5*  NEUTROABS 9.0*  --   HGB 11.7* 11.1*  HCT 34.0* 32.6*  MCV 93.4 92.1  PLT 240 237    Basic Metabolic Panel: Recent Labs  Lab 04/16/23 1201 04/17/23 0602  NA 129* 124*  K 3.8 3.7  CL 96* 94*  CO2 20* 22  GLUCOSE 163* 136*  BUN 17 21  CREATININE 0.85 0.76  CALCIUM 9.3 8.9  MG  --  2.0    CBG: No results for input(s): "GLUCAP" in the last 168 hours.  Recent Results (from the past 240 hours)  Resp panel by RT-PCR (RSV, Flu A&B, Covid) Anterior Nasal Swab     Status: Abnormal   Collection Time: 04/16/23 11:44 AM   Specimen: Anterior Nasal Swab  Result Value Ref Range Status   SARS Coronavirus 2 by RT PCR NEGATIVE NEGATIVE Final    Comment: (NOTE) SARS-CoV-2 target nucleic acids are NOT DETECTED.  The SARS-CoV-2 RNA is generally detectable in upper respiratory specimens during the acute phase of infection. The lowest concentration of SARS-CoV-2 viral copies this assay can detect is 138 copies/mL. A negative result does not preclude SARS-Cov-2 infection and should not be used as the sole basis for treatment or other patient management decisions. A negative result may occur with  improper specimen collection/handling, submission of specimen other than nasopharyngeal swab, presence of viral mutation(s) within the areas targeted by this assay, and inadequate number of viral copies(<138 copies/mL). A negative result must be combined with clinical observations, patient history, and epidemiological information. The expected result is Negative.  Fact Sheet for Patients:  BloggerCourse.com  Fact Sheet for Healthcare Providers:  SeriousBroker.it  This test is no t yet approved or cleared by the [place] FDA and  has been authorized for detection and/or diagnosis of SARS-CoV-2 by FDA under an Emergency Use Authorization (EUA). This EUA will  remain  in effect (meaning this test can be used) for the duration of the COVID-19 declaration under Section 564(b)(1) of the Act, 21 U.S.C.section 360bbb-3(b)(1), unless the authorization is terminated  or revoked sooner.       Influenza A by PCR POSITIVE (A) NEGATIVE Final   Influenza B by PCR NEGATIVE NEGATIVE Final    Comment: (NOTE) The Xpert Xpress SARS-CoV-2/FLU/RSV plus assay is intended as an aid in the diagnosis of influenza from Nasopharyngeal swab specimens and should not be used as a sole basis for treatment. Nasal washings and aspirates are unacceptable for Xpert Xpress SARS-CoV-2/FLU/RSV testing.  Fact Sheet for Patients: BloggerCourse.com  Fact Sheet for Healthcare Providers: SeriousBroker.it  This test is not yet approved or cleared by the [place] FDA and has been authorized for detection and/or diagnosis of SARS-CoV-2 by FDA under an Emergency Use Authorization (EUA). This EUA will remain in effect (meaning this test can be used) for the duration of the COVID-19 declaration under Section 564(b)(1) of the Act, 21 U.S.C. section 360bbb-3(b)(1), unless the authorization is terminated or revoked.     Resp Syncytial Virus by PCR NEGATIVE NEGATIVE Final    Comment: (NOTE) Fact Sheet for Patients: BloggerCourse.com  Fact Sheet for Healthcare Providers: SeriousBroker.it  This test is not yet approved or cleared by the [place] FDA and has been authorized for detection and/or diagnosis of SARS-CoV-2 by FDA under an Emergency Use Authorization (  EUA). This EUA will remain in effect (meaning this test can be used) for the duration of the COVID-19 declaration under Section 564(b)(1) of the Act, 21 U.S.C. section 360bbb-3(b)(1), unless the authorization is terminated or revoked.  Performed at [facility], [address] [place], [place] [address]   MRSA  Next Gen by PCR, Nasal     Status: None   Collection Time: 04/17/23 12:28 AM   Specimen: Nasal Mucosa; Nasal Swab  Result Value Ref Range Status   MRSA by PCR Next Gen NOT DETECTED NOT DETECTED Final    Comment: (NOTE) The GeneXpert MRSA Assay (FDA approved for NASAL specimens only), is one component of a comprehensive MRSA colonization surveillance program. It is not intended to diagnose MRSA infection nor to guide or monitor treatment for MRSA infections. Test performance is not FDA approved in patients less than 44 years old. Performed at [facility], [address] [place], [place] [address]      Radiology Studies: DG Chest Portable 1 View Result Date: 04/16/2023 CLINICAL DATA:  Shortness of breath.  Cough. EXAM: PORTABLE CHEST 1 VIEW COMPARISON:  March 05, 2022. FINDINGS: The heart size and mediastinal contours are within normal limits. Both lungs are clear. The visualized skeletal structures are unremarkable. IMPRESSION: No active disease. Electronically Signed   By: Lupita Raider M.D.   On: 04/16/2023 12:40    Scheduled Meds:  amLODipine  10 mg Oral Daily   arformoterol  15 mcg Nebulization BID   budesonide (PULMICORT) nebulizer solution  0.5 mg Nebulization BID   Chlorhexidine Gluconate Cloth  6 each Topical Daily   citalopram  20 mg Oral Daily   dextromethorphan-guaiFENesin  1 tablet Oral BID   enoxaparin (LOVENOX) injection  40 mg Subcutaneous Q24H   ipratropium-albuterol  3 mL Nebulization Q4H   [START ON 04/18/2023] levothyroxine  88 mcg Oral Q0600   losartan  100 mg Oral Daily   methylPREDNISolone (SOLU-MEDROL) injection  40 mg Intravenous Q12H   mouth rinse  15 mL Mouth Rinse 4 times per day   oseltamivir  30 mg Oral BID   pantoprazole  40 mg Oral Daily   pravastatin  40 mg Oral q1800   sodium chloride flush  3 mL Intravenous Q12H   Continuous Infusions:  sodium chloride       LOS: 1 day   Critical Care Procedure Note Authorized and Performed by: Maryln Manuel MD  Total Critical Care time:  58 mins Due to a high probability of clinically significant, life threatening deterioration, the patient required my highest level of preparedness to intervene emergently and I personally spent this critical care time directly and personally managing the patient.  This critical care time included obtaining a history; examining the patient, pulse oximetry; ordering and review of studies; arranging urgent treatment with development of a management plan; evaluation of patient's response of treatment; frequent reassessment; and discussions with other providers.  This critical care time was performed to assess and manage the high probability of imminent and life threatening deterioration that could result in multi-organ failure.  It was exclusive of separately billable procedures and treating other patients and teaching time.    Standley Dakins, MD How to contact the [facility] Attending or Consulting provider 7A - 7P or covering provider during after hours 7P -7A, for this patient?  Check the care team in [facility] and look for a) attending/consulting [facility] provider listed and b) the [facility] team listed Log into [email or web address] to find provider on call.  Locate the [facility] provider you are looking for under [facility] and page to a number that you can be directly reached. If you still have difficulty reaching the provider, please page the [facility] (Director on Call) for the Hospitalists listed on amion for assistance.  04/17/2023, 1:00 PM

## 2023-04-17 NOTE — Progress Notes (Signed)
Dr.Opyd in the room talking with patient and son who is at the bedside.

## 2023-04-17 NOTE — Progress Notes (Signed)
Patient transferred to ICU and started on HHFNC. Will titrate as possible but started patient on 25L at 70%. Breathing treatment given. Patient tolerating well at this time and respiratory rate is coming down.

## 2023-04-17 NOTE — Progress Notes (Signed)
Patient is currently on 4L [place] with no distress noted.  HHFNC not needed at this time.  Will continue to monitor.

## 2023-04-17 NOTE — Progress Notes (Signed)
Dr.Johnson notified pt's sodium level dropped by 5 in one day. Also that pt.is on heart diet. (Low sodium)

## 2023-04-17 NOTE — Progress Notes (Signed)
Bipap order is PRN.  Not needed at this time. 

## 2023-04-17 NOTE — Progress Notes (Signed)
Patient transferred to ICU. Son who is at the bedside has patient belongings.

## 2023-04-17 NOTE — Progress Notes (Signed)
   04/16/23 2239  Assess: MEWS Score  Temp 99.1 F (37.3 C)  BP (!) 145/70  MAP (mmHg) 90  Pulse Rate 98  Resp (!) 32  Level of Consciousness Alert  SpO2 92 %  O2 Device Nasal Cannula  O2 Flow Rate (L/min) 4 L/min  Assess: MEWS Score  MEWS Temp 0  MEWS Systolic 0  MEWS Pulse 0  MEWS RR 2  MEWS LOC 0  MEWS Score 2  MEWS Score Color Yellow  Assess: if the MEWS score is Yellow or Red  Were vital signs accurate and taken at a resting state? Yes  Does the patient meet 2 or more of the SIRS criteria? Yes  Does the patient have a confirmed or suspected source of infection? No  MEWS guidelines implemented  Yes, yellow  Treat  MEWS Interventions Considered administering scheduled or prn medications/treatments as ordered  Take Vital Signs  Increase Vital Sign Frequency  Yellow: Q2hr x1, continue Q4hrs until patient remains green for 12hrs  Escalate  MEWS: Escalate Yellow: Discuss with charge nurse and consider notifying provider and/or RRT  Provider Notification  Provider Name/Title Odie Sera  Date Provider Notified 04/16/23  Time Provider Notified 2238  Method of Notification Page (secure chat)  Notification Reason Change in status  Provider response See new orders;Other (Comment)  Date of Provider Response 04/16/23  Time of Provider Response 2245  Assess: SIRS CRITERIA  SIRS Temperature  0  SIRS Respirations  1  SIRS Pulse 1  SIRS WBC 0  SIRS Score Sum  2

## 2023-04-17 NOTE — Hospital Course (Signed)
78 y.o. female with medical history significant of COPD/asthma, GERD, hypertension, hypothyroidism and chronic pain due to fibromyalgia and degenerative diseases/laminectomy; who presented to the hospital secondary to increase dry coughing spells, shortness of breath, chills and general malaise.  Symptoms have been present for the last 3 days or so and worsening. patient reporting the use of bronchodilators at home not improving pressure when the sensation and increased expiratory wheezing process.  Patient also expressed checking her oxygen saturation at home demonstrating mid 80s saturation on room air.  She does not use oxygen at baseline.  positive influenza A infection, no acute infiltrates in her chest x-ray, normal WBCs and overall normal electrolytes, LFTs and renal function.  Patient received Tamiflu, steroids, bronchodilator management and was placed on oxygen supplementation.

## 2023-04-17 NOTE — Progress Notes (Signed)
Entered room due to patient RR was in the 30's with vital signs and due to a yellow MEWS. Patient was found tachypnea with a RR of 32. Oxygen sats 88-92% on 4L oxygen via nasal cannula. Diminished breath sounds. Patient having a hard time completing her sentences while talking due to SOB. Dr. Antionette Char notified and asked to come and see patient.

## 2023-04-17 NOTE — Progress Notes (Signed)
Report called to Stephens Shire RN in the ICU.

## 2023-04-18 ENCOUNTER — Encounter (HOSPITAL_COMMUNITY): Payer: Self-pay | Admitting: Internal Medicine

## 2023-04-18 DIAGNOSIS — J101 Influenza due to other identified influenza virus with other respiratory manifestations: Secondary | ICD-10-CM | POA: Diagnosis not present

## 2023-04-18 DIAGNOSIS — E039 Hypothyroidism, unspecified: Secondary | ICD-10-CM

## 2023-04-18 DIAGNOSIS — J9601 Acute respiratory failure with hypoxia: Secondary | ICD-10-CM | POA: Diagnosis not present

## 2023-04-18 LAB — BASIC METABOLIC PANEL
Anion gap: 9 (ref 5–15)
BUN: 21 mg/dL (ref 8–23)
CO2: 23 mmol/L (ref 22–32)
Calcium: 8.7 mg/dL — ABNORMAL LOW (ref 8.9–10.3)
Chloride: 94 mmol/L — ABNORMAL LOW (ref 98–111)
Creatinine, Ser: 0.81 mg/dL (ref 0.44–1.00)
GFR, Estimated: >60 mL/min (ref >60)
Glucose, Bld: 166 mg/dL — ABNORMAL HIGH (ref 70–99)
Potassium: 4.1 mmol/L (ref 3.5–5.1)
Sodium: 126 mmol/L — ABNORMAL LOW (ref 135–145)

## 2023-04-18 LAB — GLUCOSE, CAPILLARY
Glucose-Capillary: 119 mg/dL — ABNORMAL HIGH (ref 70–99)
Glucose-Capillary: 140 mg/dL — ABNORMAL HIGH (ref 70–99)
Glucose-Capillary: 108 mg/dL — ABNORMAL HIGH (ref 70–99)
Glucose-Capillary: 132 mg/dL — ABNORMAL HIGH (ref 70–99)

## 2023-04-18 MED ORDER — ALUM & MAG HYDROXIDE-SIMETH 200-200-20 MG/5ML PO SUSP
30.0000 mL | ORAL | Status: DC | PRN
  Administered 2023-04-18: 30 mL via ORAL
  Filled 2023-04-18: qty 30

## 2023-04-18 MED ORDER — SODIUM CHLORIDE 0.9 % IV BOLUS
500.0000 mL | Freq: Once | INTRAVENOUS | Status: AC
  Administered 2023-04-18: 500 mL via INTRAVENOUS

## 2023-04-18 MED ORDER — METHYLPREDNISOLONE SODIUM SUCC 40 MG IJ SOLR
40.0000 mg | Freq: Every day | INTRAMUSCULAR | Status: DC
  Administered 2023-04-19 – 2023-04-21 (×3): 40 mg via INTRAVENOUS
  Filled 2023-04-18 (×3): qty 1

## 2023-04-18 NOTE — Progress Notes (Signed)
PROGRESS NOTE   Alexis Ortega  ZOX:[id number] DOB: 08/17/1945 DOA: 04/16/2023 PCP: Toma Deiters, MD   Chief Complaint  Patient presents with   Shortness of Breath   Level of care: Stepdown  Brief Admission History:  78 y.o. female with medical history significant of COPD/asthma, GERD, hypertension, hypothyroidism and chronic pain due to fibromyalgia and degenerative diseases/laminectomy; who presented to the hospital secondary to increase dry coughing spells, shortness of breath, chills and general malaise.  Symptoms have been present for the last 3 days or so and worsening. patient reporting the use of bronchodilators at home not improving pressure when the sensation and increased expiratory wheezing process.  Patient also expressed checking her oxygen saturation at home demonstrating mid 80s saturation on room air.  She does not use oxygen at baseline.  positive influenza A infection, no acute infiltrates in her chest x-ray, normal WBCs and overall normal electrolytes, LFTs and renal function.  Patient received Tamiflu, steroids, bronchodilator management and was placed on oxygen supplementation.     Assessment and Plan:  Acute respiratory failure with hypoxia -Patient presented with tachypnea, oxygen saturation in the mid 80s on room air and requiring 4 L nasal cannula supplementation on presentation -pt transferred to stepdown ICU due to acute worsening dyspnea overnight and bipap started  -Positive influenza A and COPD exacerbation secondary to patient's respiratory failure. -Continue treatment with Tamiflu -Continue IV steroids, bronchodilators, mucolytics, flutter valve and as needed antitussive medications. -continue Brovana and Pulmicort. -Continue supportive care and maintain adequate hydration -Wean off oxygen supplementation as tolerated. Now on 3L/m [place].    hypothyroidism -TSH -->0.557 -Continue home dose levothyroxine  Hyponatremia -suspect from volume  depletion -gently hydrate again as it has come up by 2  -recheck in Am    hypertension -Continue home antihypertensive regimen (Cozaar and Norvasc) -Follow vital signs. -Heart healthy/low-sodium diet discussed with patient.   gastroesophageal reflux disease -Continue PPI   chronic pain syndrome Fibromyalgia -Continue treatment with Tylenol and as needed tramadol.   depression/anxiety -Resumed home Celexa and as needed Xanax -No suicidal ideation or hallucinations currently   DVT prophylaxis: enoxaparin Code Status: Full  Family Communication:  Disposition: anticipate home    Consultants:   Procedures:   Antimicrobials:    Subjective: Having a lot of chest congestion and wheezing; oxygen starting to be weaned down some this morning.  Objective: Vitals:   04/18/23 0900 04/18/23 1100 04/18/23 1112 04/18/23 1154  BP: (!) 129/49 (!) 132/45    Pulse: (!) 57 64 75   Resp: 18 19 19    Temp:    98.3 F (36.8 C)  TempSrc:    Oral  SpO2: 98% 97% 99%   Weight:      Height:        Intake/Output Summary (Last 24 hours) at 04/18/2023 1204 Last data filed at 04/18/2023 0900 Gross per 24 hour  Intake 703 ml  Output 600 ml  Net 103 ml   Filed Weights   04/16/23 1144 04/17/23 0027 04/18/23 0524  Weight: 69 kg 71.3 kg 73 kg   Examination:  General exam: Appears calm and comfortable  Respiratory system: moderate increased work of breathing bilateral expiratory wheezing heard.  Cardiovascular system: normal S1 & S2 heard. No JVD, murmurs, rubs, gallops or clicks. No pedal edema. Gastrointestinal system: Abdomen is nondistended, soft and nontender. No organomegaly or masses felt. Normal bowel sounds heard. Central nervous system: Alert and oriented. No focal neurological deficits. Extremities: Symmetric 5 x 5 power. Skin:  No rashes, lesions or ulcers. Psychiatry: Judgement and insight appear normal. Mood & affect appropriate.   Data Reviewed: I have personally reviewed  following labs and imaging studies  CBC: Recent Labs  Lab 04/16/23 1201 04/17/23 0602  WBC 10.0 14.5*  NEUTROABS 9.0*  --   HGB 11.7* 11.1*  HCT 34.0* 32.6*  MCV 93.4 92.1  PLT 240 237    Basic Metabolic Panel: Recent Labs  Lab 04/16/23 1201 04/17/23 0602 04/18/23 0840  NA 129* 124* 126*  K 3.8 3.7 4.1  CL 96* 94* 94*  CO2 20* 22 23  GLUCOSE 163* 136* 166*  BUN 17 21 21   CREATININE 0.85 0.76 0.81  CALCIUM 9.3 8.9 8.7*  MG  --  2.0  --     CBG: Recent Labs  Lab 04/17/23 1621 04/17/23 2101 04/18/23 0757 04/18/23 1145  GLUCAP 129* 140* 119* 140*    Recent Results (from the past 240 hours)  Resp panel by RT-PCR (RSV, Flu A&B, Covid) Anterior Nasal Swab     Status: Abnormal   Collection Time: 04/16/23 11:44 AM   Specimen: Anterior Nasal Swab  Result Value Ref Range Status   SARS Coronavirus 2 by RT PCR NEGATIVE NEGATIVE Final    Comment: (NOTE) SARS-CoV-2 target nucleic acids are NOT DETECTED.  The SARS-CoV-2 RNA is generally detectable in upper respiratory specimens during the acute phase of infection. The lowest concentration of SARS-CoV-2 viral copies this assay can detect is 138 copies/mL. A negative result does not preclude SARS-Cov-2 infection and should not be used as the sole basis for treatment or other patient management decisions. A negative result may occur with  improper specimen collection/handling, submission of specimen other than nasopharyngeal swab, presence of viral mutation(s) within the areas targeted by this assay, and inadequate number of viral copies(<138 copies/mL). A negative result must be combined with clinical observations, patient history, and epidemiological information. The expected result is Negative.  Fact Sheet for Patients:  BloggerCourse.com  Fact Sheet for Healthcare Providers:  SeriousBroker.it  This test is no t yet approved or cleared by the [place] FDA and   has been authorized for detection and/or diagnosis of SARS-CoV-2 by FDA under an Emergency Use Authorization (EUA). This EUA will remain  in effect (meaning this test can be used) for the duration of the COVID-19 declaration under Section 564(b)(1) of the Act, 21 U.S.C.section 360bbb-3(b)(1), unless the authorization is terminated  or revoked sooner.       Influenza A by PCR POSITIVE (A) NEGATIVE Final   Influenza B by PCR NEGATIVE NEGATIVE Final    Comment: (NOTE) The Xpert Xpress SARS-CoV-2/FLU/RSV plus assay is intended as an aid in the diagnosis of influenza from Nasopharyngeal swab specimens and should not be used as a sole basis for treatment. Nasal washings and aspirates are unacceptable for Xpert Xpress SARS-CoV-2/FLU/RSV testing.  Fact Sheet for Patients: BloggerCourse.com  Fact Sheet for Healthcare Providers: SeriousBroker.it  This test is not yet approved or cleared by the [place] FDA and has been authorized for detection and/or diagnosis of SARS-CoV-2 by FDA under an Emergency Use Authorization (EUA). This EUA will remain in effect (meaning this test can be used) for the duration of the COVID-19 declaration under Section 564(b)(1) of the Act, 21 U.S.C. section 360bbb-3(b)(1), unless the authorization is terminated or revoked.     Resp Syncytial Virus by PCR NEGATIVE NEGATIVE Final    Comment: (NOTE) Fact Sheet for Patients: BloggerCourse.com  Fact Sheet for Healthcare Providers: SeriousBroker.it  This test is not yet approved or cleared by the [place] and has been authorized for detection and/or diagnosis of SARS-CoV-2 by FDA under an Emergency Use Authorization (EUA). This EUA will remain in effect (meaning this test can be used) for the duration of the COVID-19 declaration under Section 564(b)(1) of the Act, 21 U.S.C. section 360bbb-3(b)(1),  unless the authorization is terminated or revoked.  Performed at [facility], [address] [place], [place] [address]   MRSA Next Gen by PCR, Nasal     Status: None   Collection Time: 04/17/23 12:28 AM   Specimen: Nasal Mucosa; Nasal Swab  Result Value Ref Range Status   MRSA by PCR Next Gen NOT DETECTED NOT DETECTED Final    Comment: (NOTE) The GeneXpert MRSA Assay (FDA approved for NASAL specimens only), is one component of a comprehensive MRSA colonization surveillance program. It is not intended to diagnose MRSA infection nor to guide or monitor treatment for MRSA infections. Test performance is not FDA approved in patients less than 35 years old. Performed at [facility], [address] [place], [place] [address]      Radiology Studies: DG Chest Portable 1 View Result Date: 04/16/2023 CLINICAL DATA:  Shortness of breath.  Cough. EXAM: PORTABLE CHEST 1 VIEW COMPARISON:  March 05, 2022. FINDINGS: The heart size and mediastinal contours are within normal limits. Both lungs are clear. The visualized skeletal structures are unremarkable. IMPRESSION: No active disease. Electronically Signed   By: Lupita Raider M.D.   On: 04/16/2023 12:40    Scheduled Meds:  amLODipine  10 mg Oral Daily   arformoterol  15 mcg Nebulization BID   budesonide (PULMICORT) nebulizer solution  0.5 mg Nebulization BID   Chlorhexidine Gluconate Cloth  6 each Topical Daily   citalopram  20 mg Oral Daily   dextromethorphan-guaiFENesin  1 tablet Oral BID   enoxaparin (LOVENOX) injection  40 mg Subcutaneous Q24H   insulin aspart  0-15 Units Subcutaneous TID WC   ipratropium-albuterol  3 mL Nebulization Q4H   levothyroxine  88 mcg Oral Q0600   losartan  100 mg Oral Daily   methylPREDNISolone (SOLU-MEDROL) injection  40 mg Intravenous Q12H   mouth rinse  15 mL Mouth Rinse 4 times per day   oseltamivir  30 mg Oral BID   pantoprazole  40 mg Oral Daily   pravastatin  40 mg Oral q1800   sodium chloride  flush  3 mL Intravenous Q12H   Continuous Infusions:   LOS: 2 days   Critical Care Procedure Note Authorized and Performed by: Maryln Manuel MD  Total Critical Care time:  55 mins Due to a high probability of clinically significant, life threatening deterioration, the patient required my highest level of preparedness to intervene emergently and I personally spent this critical care time directly and personally managing the patient.  This critical care time included obtaining a history; examining the patient, pulse oximetry; ordering and review of studies; arranging urgent treatment with development of a management plan; evaluation of patient's response of treatment; frequent reassessment; and discussions with other providers.  This critical care time was performed to assess and manage the high probability of imminent and life threatening deterioration that could result in multi-organ failure.  It was exclusive of separately billable procedures and treating other patients and teaching time.    Standley Dakins, MD How to contact the [facility] Attending or Consulting provider 7A - 7P or covering provider during after hours 7P -7A, for this  patient?  Check the care team in [facility] and look for a) attending/consulting TRH provider listed and b) the [facility] team listed Log into [email or web address] to find provider on call.  Locate the [facility] provider you are looking for under [facility] and page to a number that you can be directly reached. If you still have difficulty reaching the provider, please page the [facility] (Director on Call) for the Hospitalists listed on amion for assistance.  04/18/2023, 12:04 PM

## 2023-04-18 NOTE — Progress Notes (Signed)
Transition of Care Department [facility]) has reviewed patient and no other TOC needs have been identified at this time. We will continue to monitor patient advancement through interdisciplinary progression rounds. If new patient transition needs arise, please place a TOC consult.   04/18/23 1153  TOC Brief Assessment  Insurance and Status Reviewed  Patient has primary care physician Yes  Home environment has been reviewed Lives with husband.  Prior level of function: Fairly independent.  Prior/Current Home Services No current home services  Social Drivers of Health Review SDOH reviewed no interventions necessary  Readmission risk has been reviewed Yes  Transition of care needs no transition of care needs at this time

## 2023-04-18 NOTE — Progress Notes (Signed)
Took patient down to 3L [place] from 4 due to sats being in upper 90s.  Will continue to monitor and wean as tolerated.

## 2023-04-19 ENCOUNTER — Inpatient Hospital Stay (HOSPITAL_COMMUNITY): Payer: Medicare Other

## 2023-04-19 DIAGNOSIS — J441 Chronic obstructive pulmonary disease with (acute) exacerbation: Secondary | ICD-10-CM | POA: Diagnosis not present

## 2023-04-19 DIAGNOSIS — J101 Influenza due to other identified influenza virus with other respiratory manifestations: Secondary | ICD-10-CM | POA: Diagnosis not present

## 2023-04-19 DIAGNOSIS — J9601 Acute respiratory failure with hypoxia: Secondary | ICD-10-CM | POA: Diagnosis not present

## 2023-04-19 LAB — GLUCOSE, CAPILLARY
Glucose-Capillary: 69 mg/dL — ABNORMAL LOW (ref 70–99)
Glucose-Capillary: 105 mg/dL — ABNORMAL HIGH (ref 70–99)
Glucose-Capillary: 156 mg/dL — ABNORMAL HIGH (ref 70–99)
Glucose-Capillary: 143 mg/dL — ABNORMAL HIGH (ref 70–99)

## 2023-04-19 LAB — BASIC METABOLIC PANEL
Anion gap: 8 (ref 5–15)
BUN: 19 mg/dL (ref 8–23)
CO2: 26 mmol/L (ref 22–32)
Calcium: 8.7 mg/dL — ABNORMAL LOW (ref 8.9–10.3)
Chloride: 97 mmol/L — ABNORMAL LOW (ref 98–111)
Creatinine, Ser: 0.78 mg/dL (ref 0.44–1.00)
GFR, Estimated: >60 mL/min (ref >60)
Glucose, Bld: 89 mg/dL (ref 70–99)
Potassium: 4.2 mmol/L (ref 3.5–5.1)
Sodium: 131 mmol/L — ABNORMAL LOW (ref 135–145)

## 2023-04-19 MED ORDER — SODIUM CHLORIDE 0.9 % IV BOLUS
500.0000 mL | Freq: Once | INTRAVENOUS | Status: AC
  Administered 2023-04-19: 500 mL via INTRAVENOUS

## 2023-04-19 NOTE — Progress Notes (Signed)
Sat in chair for several hours today. Ambulates with standby to bathroom.  Continues on 2 liters and has congested productive cough.  Family visited.

## 2023-04-19 NOTE — Progress Notes (Signed)
Mobility Specialist Progress Note:    04/19/23 1445  Mobility  Activity Ambulated with assistance in room;Ambulated with assistance to bathroom  Level of Assistance Modified independent, requires aide device or extra time  Assistive Device None  Distance Ambulated (ft) 40 ft  Range of Motion/Exercises Active;All extremities  Activity Response Tolerated well  Mobility Referral Yes  Mobility visit 1 Mobility  Mobility Specialist Start Time (ACUTE ONLY) 1420  Mobility Specialist Stop Time (ACUTE ONLY) 1445  Mobility Specialist Time Calculation (min) (ACUTE ONLY) 25 min   Pt received in bed, NT at bedside. Agreeable to mobility, ModI to stand and ambulate with no AD. Tolerated well, asx throughout. Returned pt supine, all needs met.   Lawerance Bach Mobility Specialist Please contact via Special educational needs teacher or  Rehab office at [phone]

## 2023-04-19 NOTE — Progress Notes (Signed)
PROGRESS NOTE   Alexis Ortega  WUJ:[id number] DOB: Jul 14, 1945 DOA: 04/16/2023 PCP: Toma Deiters, MD   Chief Complaint  Patient presents with   Shortness of Breath   Level of care: Telemetry  Brief Admission History:  78 y.o. female with medical history significant of COPD/asthma, GERD, hypertension, hypothyroidism and chronic pain due to fibromyalgia and degenerative diseases/laminectomy; who presented to the hospital secondary to increase dry coughing spells, shortness of breath, chills and general malaise.  Symptoms have been present for the last 3 days or so and worsening. patient reporting the use of bronchodilators at home not improving pressure when the sensation and increased expiratory wheezing process.  Patient also expressed checking her oxygen saturation at home demonstrating mid 80s saturation on room air.  She does not use oxygen at baseline.  positive influenza A infection, no acute infiltrates in her chest x-ray, normal WBCs and overall normal electrolytes, LFTs and renal function.  Patient received Tamiflu, steroids, bronchodilator management and was placed on oxygen supplementation.     Assessment and Plan:  Acute respiratory failure with hypoxia -Patient presented with tachypnea, oxygen saturation in the mid 80s on room air and requiring 4 L nasal cannula supplementation on presentation -pt transferred to stepdown ICU due to acute worsening dyspnea overnight and bipap started  -Positive influenza A and COPD exacerbation secondary to patient's respiratory failure. -Continue treatment with Tamiflu -Continue IV steroids, bronchodilators, mucolytics, flutter valve and as needed antitussive medications. -continue Brovana and Pulmicort -Continue supportive care and maintain adequate hydration -Wean off oxygen supplementation as tolerated. Now on 2L/m [place].    hypothyroidism -TSH -->0.557 -Continue home dose levothyroxine  Hyponatremia - Improved  -suspect from volume  depletion -gently hydrate again as it has come up to 131   hypertension -Continue home antihypertensive regimen (Cozaar and Norvasc) -Following vital signs -Heart healthy/low-sodium diet discussed with patient.   gastroesophageal reflux disease -Continue PPI   chronic pain syndrome Fibromyalgia -Continue treatment with Tylenol and as needed tramadol.   depression/anxiety -Resumed home Celexa and as needed Xanax -No suicidal ideation or hallucinations currently   DVT prophylaxis: enoxaparin Code Status: Full  Family Communication:  Disposition: anticipate home in 1-2 days   Consultants:   Procedures:   Antimicrobials:    Subjective: Less shortness of breath today but still with a lot of chest congestion, no fever or chills.   Objective: Vitals:   04/19/23 0606 04/19/23 0749 04/19/23 0750 04/19/23 0751  BP:      Pulse:      Resp:      Temp:      TempSrc:      SpO2: 95% 93% 93% 93%  Weight:      Height:        Intake/Output Summary (Last 24 hours) at 04/19/2023 1157 Last data filed at 04/19/2023 0748 Gross per 24 hour  Intake 360 ml  Output 1750 ml  Net -1390 ml   Filed Weights   04/16/23 1144 04/17/23 0027 04/18/23 0524  Weight: 69 kg 71.3 kg 73 kg   Examination:  General exam: Appears calm and comfortable  Respiratory system: bilateral expiratory wheezing ant upper airway congestion noise Cardiovascular system: normal S1 & S2 heard. No JVD, murmurs, rubs, gallops or clicks. No pedal edema. Gastrointestinal system: Abdomen is nondistended, soft and nontender. No organomegaly or masses felt. Normal bowel sounds heard. Central nervous system: Alert and oriented. No focal neurological deficits. Extremities: Symmetric 5 x 5 power. Skin: No rashes, lesions or ulcers. Psychiatry: Judgement  and insight appear normal. Mood & affect appropriate.   Data Reviewed: I have personally reviewed following labs and imaging studies  CBC: Recent Labs  Lab 04/16/23 1201  04/17/23 0602  WBC 10.0 14.5*  NEUTROABS 9.0*  --   HGB 11.7* 11.1*  HCT 34.0* 32.6*  MCV 93.4 92.1  PLT 240 237    Basic Metabolic Panel: Recent Labs  Lab 04/16/23 1201 04/17/23 0602 04/18/23 0840 04/19/23 0801  NA 129* 124* 126* 131*  K 3.8 3.7 4.1 4.2  CL 96* 94* 94* 97*  CO2 20* 22 23 26   GLUCOSE 163* 136* 166* 89  BUN 17 21 21 19   CREATININE 0.85 0.76 0.81 0.78  CALCIUM 9.3 8.9 8.7* 8.7*  MG  --  2.0  --   --     CBG: Recent Labs  Lab 04/18/23 1145 04/18/23 1618 04/18/23 2138 04/19/23 0743 04/19/23 1109  GLUCAP 140* 108* 132* 69* 105*    Recent Results (from the past 240 hours)  Resp panel by RT-PCR (RSV, Flu A&B, Covid) Anterior Nasal Swab     Status: Abnormal   Collection Time: 04/16/23 11:44 AM   Specimen: Anterior Nasal Swab  Result Value Ref Range Status   SARS Coronavirus 2 by RT PCR NEGATIVE NEGATIVE Final    Comment: (NOTE) SARS-CoV-2 target nucleic acids are NOT DETECTED.  The SARS-CoV-2 RNA is generally detectable in upper respiratory specimens during the acute phase of infection. The lowest concentration of SARS-CoV-2 viral copies this assay can detect is 138 copies/mL. A negative result does not preclude SARS-Cov-2 infection and should not be used as the sole basis for treatment or other patient management decisions. A negative result may occur with  improper specimen collection/handling, submission of specimen other than nasopharyngeal swab, presence of viral mutation(s) within the areas targeted by this assay, and inadequate number of viral copies(<138 copies/mL). A negative result must be combined with clinical observations, patient history, and epidemiological information. The expected result is Negative.  Fact Sheet for Patients:  BloggerCourse.com  Fact Sheet for Healthcare Providers:  SeriousBroker.it  This test is no t yet approved or cleared by the [place] FDA and  has  been authorized for detection and/or diagnosis of SARS-CoV-2 by FDA under an Emergency Use Authorization (EUA). This EUA will remain  in effect (meaning this test can be used) for the duration of the COVID-19 declaration under Section 564(b)(1) of the Act, 21 U.S.C.section 360bbb-3(b)(1), unless the authorization is terminated  or revoked sooner.       Influenza A by PCR POSITIVE (A) NEGATIVE Final   Influenza B by PCR NEGATIVE NEGATIVE Final    Comment: (NOTE) The Xpert Xpress SARS-CoV-2/FLU/RSV plus assay is intended as an aid in the diagnosis of influenza from Nasopharyngeal swab specimens and should not be used as a sole basis for treatment. Nasal washings and aspirates are unacceptable for Xpert Xpress SARS-CoV-2/FLU/RSV testing.  Fact Sheet for Patients: BloggerCourse.com  Fact Sheet for Healthcare Providers: SeriousBroker.it  This test is not yet approved or cleared by the [place] FDA and has been authorized for detection and/or diagnosis of SARS-CoV-2 by FDA under an Emergency Use Authorization (EUA). This EUA will remain in effect (meaning this test can be used) for the duration of the COVID-19 declaration under Section 564(b)(1) of the Act, 21 U.S.C. section 360bbb-3(b)(1), unless the authorization is terminated or revoked.     Resp Syncytial Virus by PCR NEGATIVE NEGATIVE Final    Comment: (NOTE) Fact Sheet for Patients:  BloggerCourse.com  Fact Sheet for Healthcare Providers: SeriousBroker.it  This test is not yet approved or cleared by the [place] FDA and has been authorized for detection and/or diagnosis of SARS-CoV-2 by FDA under an Emergency Use Authorization (EUA). This EUA will remain in effect (meaning this test can be used) for the duration of the COVID-19 declaration under Section 564(b)(1) of the Act, 21 U.S.C. section 360bbb-3(b)(1), unless  the authorization is terminated or revoked.  Performed at [facility], [address], [place], [place] [address]   MRSA Next Gen by PCR, Nasal     Status: None   Collection Time: 04/17/23 12:28 AM   Specimen: Nasal Mucosa; Nasal Swab  Result Value Ref Range Status   MRSA by PCR Next Gen NOT DETECTED NOT DETECTED Final    Comment: (NOTE) The GeneXpert MRSA Assay (FDA approved for NASAL specimens only), is one component of a comprehensive MRSA colonization surveillance program. It is not intended to diagnose MRSA infection nor to guide or monitor treatment for MRSA infections. Test performance is not FDA approved in patients less than 30 years old. Performed at [facility], [address], [place], [place] [address]      Radiology Studies: DG CHEST PORT 1 VIEW Result Date: 04/19/2023 CLINICAL DATA:  Shortness of breath. EXAM: PORTABLE CHEST 1 VIEW COMPARISON:  Radiographs 04/16/2023 and 03/05/2022.  CT 04/06/2023. FINDINGS: 0552 hours. The heart size and mediastinal contours are stable with aortic atherosclerosis. The lungs remain clear. There is no pleural effusion or pneumothorax. No acute osseous findings are evident. Previous lower cervical fusion noted. Telemetry leads overlie the chest. IMPRESSION: No evidence of acute cardiopulmonary process. Aortic atherosclerosis. Electronically Signed   By: Carey Bullocks M.D.   On: 04/19/2023 09:55    Scheduled Meds:  amLODipine  10 mg Oral Daily   arformoterol  15 mcg Nebulization BID   budesonide (PULMICORT) nebulizer solution  0.5 mg Nebulization BID   citalopram  20 mg Oral Daily   dextromethorphan-guaiFENesin  1 tablet Oral BID   enoxaparin (LOVENOX) injection  40 mg Subcutaneous Q24H   insulin aspart  0-15 Units Subcutaneous TID WC   ipratropium-albuterol  3 mL Nebulization Q4H   levothyroxine  88 mcg Oral Q0600   losartan  100 mg Oral Daily   methylPREDNISolone (SOLU-MEDROL) injection  40 mg Intravenous Daily   oseltamivir  30 mg  Oral BID   pantoprazole  40 mg Oral Daily   pravastatin  40 mg Oral q1800   sodium chloride flush  3 mL Intravenous Q12H   Continuous Infusions:   LOS: 3 days   Time spent: 52 mins   Falan Hensler Laural Benes, MD How to contact the [facility] Attending or Consulting provider 7A - 7P or covering provider during after hours 7P -7A, for this patient?  Check the care team in [facility] and look for a) attending/consulting TRH provider listed and b) the [facility] team listed Log into [email or web address] to find provider on call.  Locate the [facility] provider you are looking for under [facility] and page to a number that you can be directly reached. If you still have difficulty reaching the provider, please page the [facility] (Director on Call) for the Hospitalists listed on amion for assistance.  04/19/2023, 11:57 AM

## 2023-04-20 DIAGNOSIS — J101 Influenza due to other identified influenza virus with other respiratory manifestations: Secondary | ICD-10-CM | POA: Diagnosis not present

## 2023-04-20 DIAGNOSIS — J441 Chronic obstructive pulmonary disease with (acute) exacerbation: Secondary | ICD-10-CM | POA: Diagnosis not present

## 2023-04-20 DIAGNOSIS — J9601 Acute respiratory failure with hypoxia: Secondary | ICD-10-CM | POA: Diagnosis not present

## 2023-04-20 LAB — GLUCOSE, CAPILLARY
Glucose-Capillary: 80 mg/dL (ref 70–99)
Glucose-Capillary: 112 mg/dL — ABNORMAL HIGH (ref 70–99)
Glucose-Capillary: 145 mg/dL — ABNORMAL HIGH (ref 70–99)
Glucose-Capillary: 115 mg/dL — ABNORMAL HIGH (ref 70–99)

## 2023-04-20 LAB — BASIC METABOLIC PANEL
Anion gap: 8 (ref 5–15)
BUN: 16 mg/dL (ref 8–23)
CO2: 26 mmol/L (ref 22–32)
Calcium: 8.7 mg/dL — ABNORMAL LOW (ref 8.9–10.3)
Chloride: 97 mmol/L — ABNORMAL LOW (ref 98–111)
Creatinine, Ser: 0.71 mg/dL (ref 0.44–1.00)
GFR, Estimated: >60 mL/min (ref >60)
Glucose, Bld: 90 mg/dL (ref 70–99)
Potassium: 4.2 mmol/L (ref 3.5–5.1)
Sodium: 131 mmol/L — ABNORMAL LOW (ref 135–145)

## 2023-04-20 NOTE — Plan of Care (Signed)

## 2023-04-20 NOTE — Progress Notes (Signed)
 PROGRESS NOTE   Alexis Ortega  FMW:[id number] DOB: Dec 11, 1945 DOA: 04/16/2023 PCP: Orpha Yancey DELENA, MD   Chief Complaint  Patient presents with   Shortness of Breath   Level of care: Telemetry  Brief Admission History:  78 y.o. female with medical history significant of COPD/asthma, GERD, hypertension, hypothyroidism and chronic pain due to fibromyalgia and degenerative diseases/laminectomy; who presented to the hospital secondary to increase dry coughing spells, shortness of breath, chills and general malaise.  Symptoms have been present for the last 3 days or so and worsening. patient reporting the use of bronchodilators at home not improving pressure when the sensation and increased expiratory wheezing process.  Patient also expressed checking her oxygen  saturation at home demonstrating mid 80s saturation on room air.  She does not use oxygen  at baseline.  positive influenza A infection, no acute infiltrates in her chest x-ray, normal WBCs and overall normal electrolytes, LFTs and renal function.  Patient received Tamiflu , steroids, bronchodilator management and was placed on oxygen  supplementation.     Assessment and Plan:  Acute respiratory failure with hypoxia -Patient presented with tachypnea, oxygen  saturation in the mid 80s on room air and requiring 4 L nasal cannula supplementation on presentation -pt transferred to stepdown ICU due to acute worsening dyspnea overnight and bipap started  -Positive influenza A and COPD exacerbation secondary to patient's respiratory failure. -Continue treatment with Tamiflu  -Continue IV steroids, bronchodilators, mucolytics, flutter valve and as needed antitussive  medications. -continue Brovana  and Pulmicort  -Continue supportive care and maintain adequate hydration -Wean off oxygen  supplementation as tolerated. Now on 2L/m [place]. -still very SOB when trying to ambulate short distances and not feeling strong enough to manage at home, afraid of rapid  return to ED, continue current treatments for now and reassess tomorrow readiness for home.     hypothyroidism -TSH -->0.557 -Continue home dose levothyroxine   Hyponatremia - Improved  -suspect from volume depletion -gently hydrate again as it has come up to 131 -recheck BMP in AM    Hypertension -Continue home antihypertensive regimen (Cozaar  and Norvasc ) -Following vital signs -Heart healthy/low-sodium diet discussed with patient.   gastroesophageal reflux disease -Continue PPI for GI protection    chronic pain syndrome Fibromyalgia -Continue treatment with Tylenol  and as needed tramadol .   depression/anxiety -Resumed home Celexa  and as needed Xanax  -No suicidal ideation or hallucinations currently   DVT prophylaxis: enoxaparin  Code Status: Full  Family Communication:  Disposition: anticipate home tomorrow if improved   Consultants:   Procedures:   Antimicrobials:    Subjective: Pt remains very SOB when trying to ambulate short distances; does not feel ready to manage at home yet.  Denies chest pain and palpitations.     Objective: Vitals:   04/20/23 0031 04/20/23 0407 04/20/23 0411 04/20/23 0733  BP:   (!) 162/65   Pulse:   81   Resp:   20   Temp:   98 F (36.7 C)   TempSrc:   Axillary   SpO2: 96% 97% 100% 98%  Weight:      Height:        Intake/Output Summary (Last 24 hours) at 04/20/2023 1149 Last data filed at 04/20/2023 0930 Gross per 24 hour  Intake 480 ml  Output --  Net 480 ml   Filed Weights   04/16/23 1144 04/17/23 0027 04/18/23 0524  Weight: 69 kg 71.3 kg 73 kg   Examination:  General exam: Appears calm and comfortable  Respiratory system: bilateral expiratory wheezing ant upper airway congestion noise  Cardiovascular system: normal S1 & S2 heard. No JVD, murmurs, rubs, gallops or clicks. No pedal edema. Gastrointestinal system: Abdomen is nondistended, soft and nontender. No organomegaly or masses felt. Normal bowel sounds  heard. Central nervous system: Alert and oriented. No focal neurological deficits. Extremities: Symmetric 5 x 5 power. Skin: No rashes, lesions or ulcers. Psychiatry: Judgement and insight appear normal. Mood & affect appropriate.   Data Reviewed: I have personally reviewed following labs and imaging studies  CBC: Recent Labs  Lab 04/16/23 1201 04/17/23 0602  WBC 10.0 14.5*  NEUTROABS 9.0*  --   HGB 11.7* 11.1*  HCT 34.0* 32.6*  MCV 93.4 92.1  PLT 240 237    Basic Metabolic Panel: Recent Labs  Lab 04/16/23 1201 04/17/23 0602 04/18/23 0840 04/19/23 0801 04/20/23 0407  NA 129* 124* 126* 131* 131*  K 3.8 3.7 4.1 4.2 4.2  CL 96* 94* 94* 97* 97*  CO2 20* 22 23 26 26   GLUCOSE 163* 136* 166* 89 90  BUN 17 21 21 19 16   CREATININE 0.85 0.76 0.81 0.78 0.71  CALCIUM  9.3 8.9 8.7* 8.7* 8.7*  MG  --  2.0  --   --   --     CBG: Recent Labs  Lab 04/19/23 1109 04/19/23 1658 04/19/23 2043 04/20/23 0753 04/20/23 1113  GLUCAP 105* 156* 143* 80 112*    Recent Results (from the past 240 hours)  Resp panel by RT-PCR (RSV, Flu A&B, Covid) Anterior Nasal Swab     Status: Abnormal   Collection Time: 04/16/23 11:44 AM   Specimen: Anterior Nasal Swab  Result Value Ref Range Status   SARS Coronavirus 2 by RT PCR NEGATIVE NEGATIVE Final    Comment: (NOTE) SARS-CoV-2 target nucleic acids are NOT DETECTED.  The SARS-CoV-2 RNA is generally detectable in upper respiratory specimens during the acute phase of infection. The lowest concentration of SARS-CoV-2 viral copies this assay can detect is 138 copies/mL. A negative result does not preclude SARS-Cov-2 infection and should not be used as the sole basis for treatment or other patient management decisions. A negative result may occur with  improper specimen collection/handling, submission of specimen other than nasopharyngeal swab, presence of viral mutation(s) within the areas targeted by this assay, and inadequate number of  viral copies(<138 copies/mL). A negative result must be combined with clinical observations, patient history, and epidemiological information. The expected result is Negative.  Fact Sheet for Patients:  bloggercourse.com  Fact Sheet for Healthcare Providers:  seriousbroker.it  This test is no t yet approved or cleared by the [place]  FDA and  has been authorized for detection and/or diagnosis of SARS-CoV-2 by FDA under an Emergency Use Authorization (EUA). This EUA will remain  in effect (meaning this test can be used) for the duration of the COVID-19 declaration under Section 564(b)(1) of the Act, 21 U.S.C.section 360bbb-3(b)(1), unless the authorization is terminated  or revoked sooner.       Influenza A by PCR POSITIVE (A) NEGATIVE Final   Influenza B by PCR NEGATIVE NEGATIVE Final    Comment: (NOTE) The Xpert Xpress SARS-CoV-2/FLU/RSV plus assay is intended as an aid in the diagnosis of influenza from Nasopharyngeal swab specimens and should not be used as a sole basis for treatment. Nasal washings and aspirates are unacceptable for Xpert Xpress SARS-CoV-2/FLU/RSV testing.  Fact Sheet for Patients: bloggercourse.com  Fact Sheet for Healthcare Providers: seriousbroker.it  This test is not yet approved or cleared by the [place]  FDA and has been authorized  for detection and/or diagnosis of SARS-CoV-2 by FDA under an Emergency Use Authorization (EUA). This EUA will remain in effect (meaning this test can be used) for the duration of the COVID-19 declaration under Section 564(b)(1) of the Act, 21 U.S.C. section 360bbb-3(b)(1), unless the authorization is terminated or revoked.     Resp Syncytial Virus by PCR NEGATIVE NEGATIVE Final    Comment: (NOTE) Fact Sheet for Patients: bloggercourse.com  Fact Sheet for Healthcare  Providers: seriousbroker.it  This test is not yet approved or cleared by the [place]  FDA and has been authorized for detection and/or diagnosis of SARS-CoV-2 by FDA under an Emergency Use Authorization (EUA). This EUA will remain in effect (meaning this test can be used) for the duration of the COVID-19 declaration under Section 564(b)(1) of the Act, 21 U.S.C. section 360bbb-3(b)(1), unless the authorization is terminated or revoked.  Performed at [facility], [address] [place], [place] [address]   MRSA Next Gen by PCR, Nasal     Status: None   Collection Time: 04/17/23 12:28 AM   Specimen: Nasal Mucosa; Nasal Swab  Result Value Ref Range Status   MRSA by PCR Next Gen NOT DETECTED NOT DETECTED Final    Comment: (NOTE) The GeneXpert MRSA Assay (FDA approved for NASAL specimens only), is one component of a comprehensive MRSA colonization surveillance program. It is not intended to diagnose MRSA infection nor to guide or monitor treatment for MRSA infections. Test performance is not FDA approved in patients less than 87 years old. Performed at [facility], [address], [place], [place] [address]      Radiology Studies: DG CHEST PORT 1 VIEW Result Date: 04/19/2023 CLINICAL DATA:  Shortness of breath. EXAM: PORTABLE CHEST 1 VIEW COMPARISON:  Radiographs 04/16/2023 and 03/05/2022.  CT 04/06/2023. FINDINGS: 0552 hours. The heart size and mediastinal contours are stable with aortic atherosclerosis. The lungs remain clear. There is no pleural effusion or pneumothorax. No acute osseous findings are evident. Previous lower cervical fusion noted. Telemetry leads overlie the chest. IMPRESSION: No evidence of acute cardiopulmonary process. Aortic atherosclerosis. Electronically Signed   By: Elsie Perone M.D.   On: 04/19/2023 09:55    Scheduled Meds:  amLODipine   10 mg Oral Daily   arformoterol   15 mcg Nebulization BID   budesonide  (PULMICORT ) nebulizer  solution  0.5 mg Nebulization BID   citalopram   20 mg Oral Daily   dextromethorphan -guaiFENesin   1 tablet Oral BID   enoxaparin  (LOVENOX ) injection  40 mg Subcutaneous Q24H   insulin  aspart  0-15 Units Subcutaneous TID WC   ipratropium-albuterol   3 mL Nebulization Q4H   levothyroxine   88 mcg Oral Q0600   losartan   100 mg Oral Daily   methylPREDNISolone  (SOLU-MEDROL ) injection  40 mg Intravenous Daily   oseltamivir   30 mg Oral BID   pantoprazole   40 mg Oral Daily   pravastatin   40 mg Oral q1800   sodium chloride  flush  3 mL Intravenous Q12H   Continuous Infusions:   LOS: 4 days   Time spent: 55 mins   Damarri Rampy Vicci, MD How to contact the [facility] Attending or Consulting provider 7A - 7P or covering provider during after hours 7P -7A, for this patient?  Check the care team in [facility] and look for a) attending/consulting [facility] provider listed and b) the [facility] team listed Log into [email or web address] to find provider on call.  Locate the [facility] provider you are looking for under [facility] and page to a number that you can be  directly reached. If you still have difficulty reaching the provider, please page the [facility] (Director on Call) for the Hospitalists listed on amion for assistance.  04/20/2023, 11:49 AM

## 2023-04-20 NOTE — Care Management Important Message (Signed)
Important Message  Patient Details  Name: Alexis Ortega MRN: [id number] Date of Birth: 08/11/1945   Important Message Given:  Other (see comment)     Sherilyn Banker 04/20/2023, 10:39 AM

## 2023-04-20 NOTE — Progress Notes (Addendum)
2/4 I spoke to patient's husband(James "Kathlene November") via telephone @[phone] and discussed the contents of the IMM Letter, I was given verbal consent and acknowledgement.

## 2023-04-21 DIAGNOSIS — J9601 Acute respiratory failure with hypoxia: Secondary | ICD-10-CM | POA: Diagnosis not present

## 2023-04-21 DIAGNOSIS — J101 Influenza due to other identified influenza virus with other respiratory manifestations: Secondary | ICD-10-CM | POA: Diagnosis not present

## 2023-04-21 DIAGNOSIS — J441 Chronic obstructive pulmonary disease with (acute) exacerbation: Secondary | ICD-10-CM | POA: Diagnosis not present

## 2023-04-21 LAB — BASIC METABOLIC PANEL
Anion gap: 9 (ref 5–15)
BUN: 16 mg/dL (ref 8–23)
CO2: 28 mmol/L (ref 22–32)
Calcium: 8.8 mg/dL — ABNORMAL LOW (ref 8.9–10.3)
Chloride: 94 mmol/L — ABNORMAL LOW (ref 98–111)
Creatinine, Ser: 0.76 mg/dL (ref 0.44–1.00)
GFR, Estimated: >60 mL/min (ref >60)
Glucose, Bld: 90 mg/dL (ref 70–99)
Potassium: 4.2 mmol/L (ref 3.5–5.1)
Sodium: 131 mmol/L — ABNORMAL LOW (ref 135–145)

## 2023-04-21 LAB — GLUCOSE, CAPILLARY
Glucose-Capillary: 81 mg/dL (ref 70–99)
Glucose-Capillary: 127 mg/dL — ABNORMAL HIGH (ref 70–99)

## 2023-04-21 MED ORDER — IPRATROPIUM-ALBUTEROL 0.5-2.5 (3) MG/3ML IN SOLN: 3.0000 mL | Freq: Four times a day (QID) | RESPIRATORY_TRACT | 1 refills | Status: DC | PRN

## 2023-04-21 MED ORDER — PREDNISONE 20 MG PO TABS: 60.0000 mg | ORAL_TABLET | Freq: Every day | ORAL | Status: DC

## 2023-04-21 MED ORDER — PREDNISONE 10 MG PO TABS: 60.0000 mg | ORAL_TABLET | Freq: Every day | ORAL | 0 refills | Status: DC

## 2023-04-21 NOTE — Discharge Summary (Signed)
 Physician Discharge Summary   Patient: Alexis Ortega MRN: [id number] DOB: [birth year]  Admit date:     04/16/2023  Discharge date: 04/21/23  Discharge Physician: Alm Fortune Torosian   PCP: Orpha Yancey DELENA, MD   Recommendations at discharge:   Please follow up with primary care provider within 1-2 weeks  Please repeat BMP and CBC in one week     Hospital Course: 78 y.o. female with medical history significant of COPD/asthma, GERD, hypertension, hypothyroidism and chronic pain due to fibromyalgia and degenerative diseases/laminectomy; who presented to the hospital secondary to increase dry coughing spells, shortness of breath, chills and general malaise.  Symptoms have been present for the last 3 days or so and worsening. patient reporting the use of bronchodilators at home not improving pressure when the sensation and increased expiratory wheezing process.  Patient also expressed checking her oxygen  saturation at home demonstrating mid 80s saturation on room air.  She does not use oxygen  at baseline.  positive influenza A infection, no acute infiltrates in her chest x-ray, normal WBCs and overall normal electrolytes, LFTs and renal function.  Patient received Tamiflu , steroids, bronchodilator management and was placed on oxygen  supplementation.    Assessment and Plan: Acute respiratory failure with hypoxia -Patient presented with tachypnea, oxygen  saturation in the mid 80s on room air and requiring 4 L nasal cannula supplementation on presentation -pt transferred to stepdown ICU due to acute worsening dyspnea overnight and bipap started  -Positive influenza A and COPD exacerbation secondary to patient's respiratory failure. -Continue treatment with Tamiflu >>received 5 days -Continue IV steroids, bronchodilators, mucolytics, flutter valve and as needed antitussive  medications. -continue Brovana  and Pulmicort  -Continue supportive care and maintain adequate hydration -Wean off oxygen  supplementation as  tolerated. Now on 2L/m [place]. -d/c home with prednisone  taper -ambulated on RA without desaturation on 2/5   hypothyroidism -TSH -->0.557 -Continue home dose levothyroxine    Hyponatremia - Improved/stable -due to volume depletion -gently hydrate again as it has come up to 131 -recheck BMP in AM    Hypertension -Continue home antihypertensive regimen (Cozaar  and Norvasc ) -Following vital signs -Heart healthy/low-sodium diet discussed with patient.   gastroesophageal reflux disease -Continue PPI for GI protection    chronic pain syndrome Fibromyalgia -Continue treatment with Tylenol  and as needed tramadol .   depression/anxiety -Resumed home Celexa  and as needed Xanax  -No suicidal ideation or hallucinations currently         Consultants: none Procedures performed: none  Disposition: Home Diet recommendation:  Cardiac diet DISCHARGE MEDICATION: Allergies as of 04/21/2023       Reactions   Cefuroxime Shortness Of Breath   Messed with my breathing real bad   Gabapentin Itching   Diclofenac Rash   Tramadol  Itching        Medication List     STOP taking these medications    doxycycline  100 MG capsule Commonly known as: VIBRAMYCIN        TAKE these medications    acetaminophen  500 MG tablet Commonly known as: TYLENOL  Take 500 mg by mouth every 6 (six) hours as needed for moderate pain or mild pain.   ALPRAZolam  0.5 MG tablet Commonly known as: XANAX  Take 0.5 mg by mouth at bedtime.   amLODipine  10 MG tablet Commonly known as: NORVASC  Take 10 mg by mouth daily.   citalopram  20 MG tablet Commonly known as: CELEXA  Take 20 mg by mouth daily.   DULoxetine 20 MG capsule Commonly known as: CYMBALTA Take 20 mg by mouth daily.   hydrocortisone  2.5 %  rectal cream Commonly known as: ANUSOL -HC Place 1 Application rectally 4 (four) times daily.   ipratropium-albuterol  0.5-2.5 (3) MG/3ML Soln Commonly known as: DUONEB Take 3 mLs by nebulization every 6  (six) hours as needed.   levothyroxine  88 MCG tablet Commonly known as: SYNTHROID  Take 88 mcg by mouth daily.   losartan  100 MG tablet Commonly known as: COZAAR  Take 100 mg by mouth daily.   multivitamin with minerals Tabs tablet Take 1 tablet by mouth daily.   ondansetron  4 MG disintegrating tablet Commonly known as: Zofran  ODT 4mg  ODT q4 hours prn nausea/vomit What changed:  how much to take how to take this when to take this reasons to take this   pravastatin  40 MG tablet Commonly known as: PRAVACHOL  Take 40 mg by mouth daily.   predniSONE  10 MG tablet Commonly known as: DELTASONE  Take 6 tablets (60 mg total) by mouth daily with breakfast. And decrease by one tablet daily Start taking on: April 22, 2023 What changed:  medication strength how much to take when to take this additional instructions   ProAir  HFA 108 (90 Base) MCG/ACT inhaler Generic drug: albuterol  Inhale 2 puffs into the lungs every 4 (four) hours as needed for wheezing. What changed: Another medication with the same name was removed. Continue taking this medication, and follow the directions you see here.   Zinc 50 MG Caps Take 50 mg by mouth daily.        Discharge Exam: Filed Weights   04/16/23 1144 04/17/23 0027 04/18/23 0524  Weight: 69 kg 71.3 kg 73 kg   HEENT:  [place]/AT, No thrush, no icterus CV:  RRR, no rub, no S3, no S4 Lung:  bibasilar rales.  Minimal basilar wheeze Abd:  soft/+BS, NT Ext:  No edema, no lymphangitis, no synovitis, no rash   Condition at discharge: stable  The results of significant diagnostics from this hospitalization (including imaging, microbiology, ancillary and laboratory) are listed below for reference.   Imaging Studies: DG CHEST PORT 1 VIEW Result Date: 04/19/2023 CLINICAL DATA:  Shortness of breath. EXAM: PORTABLE CHEST 1 VIEW COMPARISON:  Radiographs 04/16/2023 and 03/05/2022.  CT 04/06/2023. FINDINGS: 0552 hours. The heart size and mediastinal  contours are stable with aortic atherosclerosis. The lungs remain clear. There is no pleural effusion or pneumothorax. No acute osseous findings are evident. Previous lower cervical fusion noted. Telemetry leads overlie the chest. IMPRESSION: No evidence of acute cardiopulmonary process. Aortic atherosclerosis. Electronically Signed   By: Elsie Perone M.D.   On: 04/19/2023 09:55   DG Chest Portable 1 View Result Date: 04/16/2023 CLINICAL DATA:  Shortness of breath.  Cough. EXAM: PORTABLE CHEST 1 VIEW COMPARISON:  March 05, 2022. FINDINGS: The heart size and mediastinal contours are within normal limits. Both lungs are clear. The visualized skeletal structures are unremarkable. IMPRESSION: No active disease. Electronically Signed   By: Lynwood Landy Raddle M.D.   On: 04/16/2023 12:40    Microbiology: Results for orders placed or performed during the hospital encounter of 04/16/23  Resp panel by RT-PCR (RSV, Flu A&B, Covid) Anterior Nasal Swab     Status: Abnormal   Collection Time: 04/16/23 11:44 AM   Specimen: Anterior Nasal Swab  Result Value Ref Range Status   SARS Coronavirus 2 by RT PCR NEGATIVE NEGATIVE Final    Comment: (NOTE) SARS-CoV-2 target nucleic acids are NOT DETECTED.  The SARS-CoV-2 RNA is generally detectable in upper respiratory specimens during the acute phase of infection. The lowest concentration of SARS-CoV-2 viral copies  this assay can detect is 138 copies/mL. A negative result does not preclude SARS-Cov-2 infection and should not be used as the sole basis for treatment or other patient management decisions. A negative result may occur with  improper specimen collection/handling, submission of specimen other than nasopharyngeal swab, presence of viral mutation(s) within the areas targeted by this assay, and inadequate number of viral copies(<138 copies/mL). A negative result must be combined with clinical observations, patient history, and  epidemiological information. The expected result is Negative.  Fact Sheet for Patients:  bloggercourse.com  Fact Sheet for Healthcare Providers:  seriousbroker.it  This test is no t yet approved or cleared by the [place]  FDA and  has been authorized for detection and/or diagnosis of SARS-CoV-2 by FDA under an Emergency Use Authorization (EUA). This EUA will remain  in effect (meaning this test can be used) for the duration of the COVID-19 declaration under Section 564(b)(1) of the Act, 21 U.S.C.section 360bbb-3(b)(1), unless the authorization is terminated  or revoked sooner.       Influenza A by PCR POSITIVE (A) NEGATIVE Final   Influenza B by PCR NEGATIVE NEGATIVE Final    Comment: (NOTE) The Xpert Xpress SARS-CoV-2/FLU/RSV plus assay is intended as an aid in the diagnosis of influenza from Nasopharyngeal swab specimens and should not be used as a sole basis for treatment. Nasal washings and aspirates are unacceptable for Xpert Xpress SARS-CoV-2/FLU/RSV testing.  Fact Sheet for Patients: bloggercourse.com  Fact Sheet for Healthcare Providers: seriousbroker.it  This test is not yet approved or cleared by the [place]  FDA and has been authorized for detection and/or diagnosis of SARS-CoV-2 by FDA under an Emergency Use Authorization (EUA). This EUA will remain in effect (meaning this test can be used) for the duration of the COVID-19 declaration under Section 564(b)(1) of the Act, 21 U.S.C. section 360bbb-3(b)(1), unless the authorization is terminated or revoked.     Resp Syncytial Virus by PCR NEGATIVE NEGATIVE Final    Comment: (NOTE) Fact Sheet for Patients: bloggercourse.com  Fact Sheet for Healthcare Providers: seriousbroker.it  This test is not yet approved or cleared by the [place]  FDA and has  been authorized for detection and/or diagnosis of SARS-CoV-2 by FDA under an Emergency Use Authorization (EUA). This EUA will remain in effect (meaning this test can be used) for the duration of the COVID-19 declaration under Section 564(b)(1) of the Act, 21 U.S.C. section 360bbb-3(b)(1), unless the authorization is terminated or revoked.  Performed at [facility], [address], [place], [place] [address]   MRSA Next Gen by PCR, Nasal     Status: None   Collection Time: 04/17/23 12:28 AM   Specimen: Nasal Mucosa; Nasal Swab  Result Value Ref Range Status   MRSA by PCR Next Gen NOT DETECTED NOT DETECTED Final    Comment: (NOTE) The GeneXpert MRSA Assay (FDA approved for NASAL specimens only), is one component of a comprehensive MRSA colonization surveillance program. It is not intended to diagnose MRSA infection nor to guide or monitor treatment for MRSA infections. Test performance is not FDA approved in patients less than 51 years old. Performed at [facility], [address] [place], [place] [address]     Labs: CBC: Recent Labs  Lab 04/16/23 1201 04/17/23 0602  WBC 10.0 14.5*  NEUTROABS 9.0*  --   HGB 11.7* 11.1*  HCT 34.0* 32.6*  MCV 93.4 92.1  PLT 240 237   Basic Metabolic Panel: Recent Labs  Lab 04/17/23 0602 04/18/23 0840 04/19/23  0801 04/20/23 0407 04/21/23 0434  NA 124* 126* 131* 131* 131*  K 3.7 4.1 4.2 4.2 4.2  CL 94* 94* 97* 97* 94*  CO2 22 23 26 26 28   GLUCOSE 136* 166* 89 90 90  BUN 21 21 19 16 16   CREATININE 0.76 0.81 0.78 0.71 0.76  CALCIUM  8.9 8.7* 8.7* 8.7* 8.8*  MG 2.0  --   --   --   --    Liver Function Tests: Recent Labs  Lab 04/16/23 1201 04/17/23 0602  AST 33 64*  ALT 24 31  ALKPHOS 50 42  BILITOT 0.4 0.4  PROT 6.9 6.6  ALBUMIN 3.8 3.7   CBG: Recent Labs  Lab 04/20/23 1113 04/20/23 1704 04/20/23 2047 04/21/23 0749 04/21/23 1114  GLUCAP 112* 145* 115* 81 127*    Discharge time spent: greater than 30  minutes.  Signed: Alm Schneider, MD [facility] 04/21/2023

## 2023-04-21 NOTE — Plan of Care (Signed)

## 2023-04-21 NOTE — Evaluation (Addendum)
 Physical Therapy Evaluation Patient Details Name: Alexis Ortega MRN: [id number] DOB: November 30, 1945 Today's Date: 04/21/2023  History of Present Illness  78 y.o. female with medical history significant of COPD/asthma, GERD, hypertension, hypothyroidism and chronic pain due to fibromyalgia and degenerative diseases/laminectomy; who presented to the hospital secondary to increase dry coughing spells, shortness of breath, chills and general malaise.  Symptoms have been present for the last 3 days or so and worsening. patient reporting the use of bronchodilators at home not improving pressure when the sensation and increased expiratory wheezing process.  Patient also expressed checking her oxygen  saturation at home demonstrating mid 80s saturation on room air.  She does not use oxygen  at baseline.  positive influenza A infection, no acute infiltrates in her chest x-ray, normal WBCs and overall normal electrolytes, LFTs and renal function.  Patient received Tamiflu , steroids, bronchodilator management and was placed on oxygen  supplementation.  Clinical Impression   Patient is performing at functional baseline, no safety concerns noted throughout evaluation. Pt ambulating 4ft on room air with no desaturation beyond 95%. Nurse notified pt left on room air in chair eating breakfast.  Patient is safe to DC home with appropriate services, DME, and any other needs indicated.  PT to sign off at this time and discharged over to Nursing and Mobility staff for further ambulation needs. Please reconsult acute PT services if functional changes occur while admitted.  Thank you.          If plan is discharge home, recommend the following:     Can travel by private vehicle        Equipment Recommendations None recommended by PT  Recommendations for Other Services       Functional Status Assessment Patient has not had a recent decline in their functional status     Precautions / Restrictions Restrictions Weight  Bearing Restrictions Per Provider Order: No      Mobility  Bed Mobility Overal bed mobility: Modified Independent          Transfers Overall transfer level: Independent         General transfer comment: on room air; SpO2 @ 95%    Ambulation/Gait Ambulation/Gait assistance: Modified independent (Device/Increase time) Gait Distance (Feet): 30 Feet Assistive device: None Gait Pattern/deviations: WFL(Within Functional Limits)       General Gait Details: 67ft of ambulation in room on room air while [place] inserted. Pt ambulating at safe, appropriate speed and mechanics without dyspnea. SpO2 > 95% during ambulation.  Stairs            Wheelchair Mobility     Tilt Bed    Modified Rankin (Stroke Patients Only)       Balance Overall balance assessment: Independent                     Pertinent Vitals/Pain Pain Assessment Pain Assessment: No/denies pain    Home Living Family/patient expects to be discharged to:: Private residence Living Arrangements: Spouse/significant other Available Help at Discharge: Family   Home Access: Stairs to enter Entrance Stairs-Rails: Right Entrance Stairs-Number of Steps: 1   Home Layout: One level Home Equipment: Shower seat - built in;Grab bars - tub/shower;Cane - single point;Grab bars - toilet;Rolling Walker (2 wheels)      Prior Function Prior Level of Function : Independent/Modified Independent             Mobility Comments: independent ADLs Comments: independent     Extremity/Trunk Assessment   Upper Extremity Assessment Upper  Extremity Assessment: Overall WFL for tasks assessed    Lower Extremity Assessment Lower Extremity Assessment: Overall WFL for tasks assessed    Cervical / Trunk Assessment Cervical / Trunk Assessment: Kyphotic  Communication   Communication Communication: No apparent difficulties Cueing Techniques: Verbal cues;Tactile cues  Cognition Arousal: Alert Behavior During  Therapy: WFL for tasks assessed/performed Overall Cognitive Status: Within Functional Limits for tasks assessed                  General Comments      Exercises     Assessment/Plan    PT Assessment Patient does not need any further PT services  PT Problem List         PT Treatment Interventions      PT Goals (Current goals can be found in the Care Plan section)       Frequency       Co-evaluation               AM-PAC PT 6 Clicks Mobility  Outcome Measure Help needed turning from your back to your side while in a flat bed without using bedrails?: None Help needed moving from lying on your back to sitting on the side of a flat bed without using bedrails?: None Help needed moving to and from a bed to a chair (including a wheelchair)?: None Help needed standing up from a chair using your arms (e.g., wheelchair or bedside chair)?: None Help needed to walk in hospital room?: None Help needed climbing 3-5 steps with a railing? : None 6 Click Score: 24    End of Session Equipment Utilized During Treatment: Gait belt Activity Tolerance: Patient tolerated treatment well Patient left: in chair;with call bell/phone within reach Nurse Communication: Mobility status PT Visit Diagnosis: Unsteadiness on feet (R26.81);Muscle weakness (generalized) (M62.81)    Time: 9145-9095 PT Time Calculation (min) (ACUTE ONLY): 10 min   Charges:   PT Evaluation $PT Eval Low Complexity: 1 Low   PT General Charges $$ ACUTE PT VISIT: 1 Visit         Omega JONETTA Donna ALMETA, DPT [facility] [place] 336 [phone] office  Omega JONETTA Donna 04/21/2023, 10:28 AM

## 2023-04-21 NOTE — Progress Notes (Signed)
 Saturation in mid 90's on room air.  IV removed and DC instructions reviewed. Daughter driving home

## 2023-04-22 ENCOUNTER — Telehealth: Payer: Self-pay

## 2023-04-22 NOTE — Transitions of Care (Post Inpatient/ED Visit) (Signed)
   04/22/2023  Name: Alexis Ortega MRN: [id number] DOB: 07-20-1945  Today's TOC FU Call Status: Today's TOC FU Call Status:: Unsuccessful Call (1st Attempt) Unsuccessful Call (1st Attempt) Date: 04/22/23  Attempted to reach the patient regarding the most recent Inpatient/ED visit. Patient was called in an Outreach attempt to offer VBCI  30-day TOC program. Pt is eligible for program due to potential risk for readmission and/or high utilization. Unfortunately, I was not able to speak with the patient in regards to recent hospital discharge   Left a HIPAA compliant phone message message for patient including VBCI CM contact information with request for a call back in regard to recent hospital discharge     Follow Up Plan: Additional outreach attempts will be made to reach the patient to complete the Transitions of Care (Post Inpatient/ED visit) call.   Bari Mayans , BSN, RN [facility]   VBCI-Population Health RN Care Manager Direct Dial [phone]  Fax: [phone] Website: [email or web address]

## 2023-04-23 ENCOUNTER — Telehealth: Payer: Self-pay

## 2023-04-23 NOTE — Patient Instructions (Signed)
 Visit Information  Thank you for taking time to visit with me today. Please don't hesitate to contact me if I can be of assistance to you before our next scheduled telephone appointment.  Our next appointment is by telephone on 04/29/23 at 10:30am   Following is a copy of your care plan:   Goals Addressed             This Visit's Progress    TOC Care Plan       Current Barriers:  Medication management new medication Prednisone  taper  Provider appointments with PCP and possibly Pulmonology Equipment/DME Oxygen  use   RNCM Clinical Goal(s):  Patient will work with the Care Management team over the next 30 days to address Transition of Care Barriers: Medication Management Support at home Provider appointments Equipment/DME take all medications exactly as prescribed and will call provider for medication related questions as evidenced by no missed medication doses  attend all scheduled medical appointments: with PCP and Specialist if indicated  as evidenced by no missed follow-up visits   through collaboration with RN Care manager, provider, and care team.   Interventions: Evaluation of current treatment plan related to  self management and patient's adherence to plan as established by provider  Transitions of Care:  New goal. Doctor Visits  - discussed the importance of doctor visits Post discharge activity limitations prescribed by provider reviewed Reviewed Signs and symptoms of infection  Patient Goals/Self-Care Activities: Participate in Transition of Care Program/Attend TOC scheduled calls Take all medications as prescribed Attend all scheduled provider appointments Call pharmacy for medication refills 3-7 days in advance of running out of medications Attend church or other social activities Perform all self care activities independently  Perform IADL's (shopping, preparing meals, housekeeping, managing finances) independently Call provider office for new concerns or  questions   Follow Up Plan:  Telephone follow up appointment with care management team member scheduled for:  04/29/23 @1030  am  The patient has been provided with contact information for the care management team and has been advised to call with any health related questions or concerns.          Medication review  Reviewed current home medications -- provided education as needed. Patient is aware of potential side effects and was encouraged to notify PCP for any adverse side effects or unwanted symptoms not relieved with interventions  Patient will call 911 for Medical Emergencies or Life -Threatening Symptoms.  Reviewed goals for care Patient/ Caregiver verbalizes understanding of instructions with the plan of care . The  Patient / Caregiver was encouraged to make informed decisions about care, actively participate in managing health conditions, and implement lifestyle changes as needed to promote independence and self-management of healthcare. SDOH screenings have been completed and addressed if indicted.  There are no reported barriers to care.    Follow-up Plan VBCI Case Management Nurse will provide follow-up and on-going assessment ,evaluation and education of disease processes, recommended interventions for both chronic and acute medical conditions ,  along with ongoing review of symptoms ,medication reviews / reconciliation during each weekly call . Any updates , inconsistencies, discrepancies or acute care concerns will be addressed and routed to the correct Practitioner if indicated   [facility]  Please call the care guide team at [phone]  if you need to cancel or reschedule your appointment . For scheduled calls -Three attempts will be made to reach you -if the scheduled call is missed or  we are unable to  reach the you after 3 attempts no additional outreach attempts will be made and the TOC follow-up will be closed .   If you need to speak to a Nurse you may   call me directly at the number below or if I am unavailable,and  your need is urgent  please call the main VBCI number at [phone] and ask to speak with one of the [facility] ( Transition of Care )  Nurses  .  Patient was encouraged to Contact PCP with any changes in baseline or  medication regimen,  changes in health status  /  well-being, safety concerns, including falls any questions or concerns regarding ongoing medical care, any difficulty obtaining or picking up prescriptions, any changes or worsening in condition- including  symptoms not relieved  with interventions                                                                            Additionally, If you experience worsening of your symptoms, develop shortness of breath, If you are experiencing a medical emergency,  develop suicidal or homicidal thoughts you must seek medical attention immediately by calling 911 or report to your local emergency department or urgent care.   If you have a non-emergency medical problem during routine business hours, please contact your provider's office and ask to speak with a nurse.       Please take the time to read instructions/literature along with the possible adverse reactions/side effects for all the Medicines that have been prescribed to you. Only take newly prescribed  Medications after you have completely understood and accept all the possible adverse reactions/side effects.   Do not take more than prescribed Medications for  Pain, Sleep and Anxiety. Do not drive when taking Pain medications or sleep aid/ insomnia  medications It is not advisable to combine anxiety, sleep and pain medications without talking with your primary care practitioner    If you are experiencing a Mental Health or Behavioral Health Crisis or need someone to talk to Please call the Suicide and Crisis Lifeline: 37 You may also call the [place]  National Suicide Prevention Lifeline: (563)185-3709 or TTY: (931)131-6938 TTY  956 061 8474) to talk to a trained counselor.  You may call the Behavioral Health Crisis Line at 364-736-3928, at any time, 24 hours a day, 7 days a week- however If you are in danger or need immediate medical attention, call 911.   If you would like help to quit smoking, call 1-800-QUIT-NOW ( 702-352-9678) OR Espaol: 1-855-Djelo-Ya (8-144-664-6430) o para ms informacin haga clic aqu or Text READY to 799-599 to register via text.   Bari Mayans , BSN, RN [facility]   VBCI-Population Health RN Care Manager Direct Dial [phone]  Website: [email or web address]

## 2023-04-23 NOTE — Transitions of Care (Post Inpatient/ED Visit) (Signed)
 04/23/2023  Name: Alexis Ortega MRN: [id number] DOB: 1945-05-12  Today's TOC FU Call Status: Today's TOC FU Call Status:: Successful TOC FU Call Completed Unsuccessful Call (1st Attempt) Date: 04/22/23 [facility] FU Call Complete Date: 04/23/23 Patient's Name and Date of Birth confirmed.  Transition Care Management Follow-up Telephone Call Date of Discharge: 04/21/23 [facility] Type of Discharge: Inpatient Admission Primary Inpatient Discharge Diagnosis:: Acute respiratory failure with hypoxia How have you been since you were released from the hospital?: Better Any questions or concerns?: No  Items Reviewed: Did you receive and understand the discharge instructions provided?: Yes Medications obtained,verified, and reconciled?: Yes (Medications Reviewed) Any new allergies since your discharge?: No Dietary orders reviewed?: Yes Type of Diet Ordered:: Reg Heart Healthy Do you have support at home?: Yes People in Home: spouse, child(ren), adult Name of Support/Comfort Primary Source: Husband -Lynwood Deanie Browning, son Duane  Medications Reviewed Today: Medications Reviewed Today     Reviewed by Willma Camelia CROME, RN (Registered Nurse) on 04/23/23 at 1542  Med List Status: <None>   Medication Order Taking? Sig Documenting Provider Last Dose Status Informant  acetaminophen  (TYLENOL ) 500 MG tablet [id number] Yes Take 500 mg by mouth every 6 (six) hours as needed for moderate pain or mild pain. [provider] Taking Active Self  ALPRAZolam  (XANAX ) 0.5 MG tablet [id number] Yes Take 0.5 mg by mouth at bedtime. [provider] Taking Active Self  amLODipine  (NORVASC ) 10 MG tablet [id number] Yes Take 10 mg by mouth daily. [provider] Taking Active Self           Med Note ADELAIDA, LISA L   Sat Jun 02, 2014  5:19 PM)    citalopram  (CELEXA ) 20 MG tablet [id number] Yes Take 20 mg by mouth daily. [provider] Taking Active Self   DULoxetine (CYMBALTA) 20 MG capsule [id number] No Take 20 mg by mouth daily.  Patient not taking: Reported on 04/23/2023   [provider] Not Taking Active   hydrocortisone  (ANUSOL -HC) 2.5 % rectal cream [id number] Yes Place 1 Application rectally 4 (four) times daily. Carlan, Chelsea L, NP Taking Active   ipratropium-albuterol  (DUONEB) 0.5-2.5 (3) MG/3ML SOLN [id number] Yes Take 3 mLs by nebulization every 6 (six) hours as needed. Evonnie Lenis, MD Taking Active   levothyroxine  (SYNTHROID ) 88 MCG tablet [id number] Yes Take 88 mcg by mouth daily. [provider] Taking Active   losartan  (COZAAR ) 100 MG tablet [id number] Yes Take 100 mg by mouth daily. [provider] Taking Active Self  Multiple Vitamin (MULTIVITAMIN WITH MINERALS) TABS tablet [id number] Yes Take 1 tablet by mouth daily. [provider] Taking Active Self  ondansetron  (ZOFRAN  ODT) 4 MG disintegrating tablet [id number] Yes 4mg  ODT q4 hours prn nausea/vomit  Patient taking differently: Take 4 mg by mouth every 4 (four) hours as needed for nausea or vomiting. 4mg  ODT q4 hours prn nausea/vomit   Zammit, Joseph, MD Taking Active Self           Med Note (WARD, CHUCK MATSU   Fri Apr 16, 2023  2:48 PM)    pravastatin  (PRAVACHOL ) 40 MG tablet [id number] Yes Take 40 mg by mouth daily. [provider] Taking Active   predniSONE  (DELTASONE ) 10 MG tablet [id number] Yes Take 6 tablets (60 mg total) by mouth daily with breakfast. And decrease by one tablet daily Tat, David, MD Taking Active   PROAIR  HFA 108 (90 BASE) MCG/ACT inhaler [id number] Yes Inhale 2 puffs into the lungs every 4 (  four) hours as needed for wheezing.  [provider] Taking Active Self  Zinc 50 MG CAPS [id number] Yes Take 50 mg by mouth daily. [provider] Taking Active Self            Home Care and Equipment/Supplies: Were Home Health Services Ordered?: No Any new equipment or medical supplies ordered?: Yes Name of  Medical supply agency?: [facility] Were you able to get the equipment/medical supplies?: Yes Do you have any questions related to the use of the equipment/supplies?: No  Functional Questionnaire: Do you need assistance with bathing/showering or dressing?: No Do you need assistance with meal preparation?: No Do you need assistance with eating?: No Do you have difficulty maintaining continence: No Do you need assistance with getting out of bed/getting out of a chair/moving?: No Do you have difficulty managing or taking your medications?: No  Follow up appointments reviewed: PCP Follow-up appointment confirmed?: No (she will schedule) MD Provider Line Number:[phone] Given: No Specialist Hospital Follow-up appointment confirmed?: NA Do you need transportation to your follow-up appointment?: Yes (shedrives and family transport)  SDOH Interventions Today    Flowsheet Row Most Recent Value  SDOH Interventions   Food Insecurity Interventions Intervention Not Indicated  Housing Interventions Intervention Not Indicated  Transportation Interventions Intervention Not Indicated, Patient Resources (Friends/Family)  Utilities Interventions Intervention Not Indicated  Social Connections Interventions Intervention Not Indicated       Goals Addressed             This Visit's Progress    TOC Care Plan       Current Barriers:  Medication management new medication Prednisone  taper  Provider appointments with PCP and possibly Pulmonology Equipment/DME Oxygen  use   RNCM Clinical Goal(s):  Patient will work with the Care Management team over the next 30 days to address Transition of Care Barriers: Medication Management Support at home Provider appointments Equipment/DME take all medications exactly as prescribed and will call provider for medication related questions as evidenced by no missed medication doses  attend all scheduled medical appointments: with PCP and Specialist if  indicated  as evidenced by no missed follow-up visits   through collaboration with RN Care manager, provider, and care team.   Interventions: Evaluation of current treatment plan related to  self management and patient's adherence to plan as established by provider  Transitions of Care:  New goal. Doctor Visits  - discussed the importance of doctor visits Post discharge activity limitations prescribed by provider reviewed Reviewed Signs and symptoms of infection  Patient Goals/Self-Care Activities: Participate in Transition of Care Program/Attend TOC scheduled calls Take all medications as prescribed Attend all scheduled provider appointments Call pharmacy for medication refills 3-7 days in advance of running out of medications Attend church or other social activities Perform all self care activities independently  Perform IADL's (shopping, preparing meals, housekeeping, managing finances) independently Call provider office for new concerns or questions   Follow Up Plan:  Telephone follow up appointment with care management team member scheduled for:  04/29/23 @1030  am  The patient has been provided with contact information for the care management team and has been advised to call with any health related questions or concerns.         Patient is at high risk for readmission and/or has history of  high utilization  Discussed VBCI  TOC program and weekly calls to patient to assess condition/status, medication management  and provide support/education as indicated . Patient/ Caregiver voiced understanding and is  agreeable to 30 day program    Routine follow-up and on-going assessment evaluation and education of disease processes, recommended interventions for both chronic and acute medical conditions , will occur during each weekly visit along with ongoing review of symptoms ,medication reviews and reconciliation. Any updates , inconsistencies, discrepancies or acute care concerns will be  addressed and routed to the correct Practitioner if indicated   Based on current information and Insurance plan -Reviewed benefits available to patient, including details about eligibility options for care if any area of needs were identified.  Reviewed patients ability to access and / or navigating the benefits system..Amb Referral made if indicted , refer to orders section of note for details   Please refer to Care Plan for goals and interventions -Effectiveness of interventions, symptom management and outcomes will be evaluated  weekly during [facility] 30-day Program Outreach calls  . Any necessary  changes and updates to Care Plan will be completed episodically    Reviewed goals for care Patient verbalizes understanding of instructions and care plan provided. Patient was encouraged to make informed decisions about their care, actively participate in managing their health condition, and implement lifestyle changes as needed to promote independence and self-management of health care   Patient was encouraged to Contact PCP with any changes in baseline or  medication regimen,  changes in health status  /  well-being, safety concerns, including falls any questions or concerns regarding ongoing medical care, any difficulty obtaining or picking up prescriptions, any changes or worsening in condition- including  symptoms not relieved  with interventions    The patient has been provided with contact information for the care management team and has been advised to call with any health-related questions or concerns. Follow up as indicated with Care Team , or sooner should any new problems arise.   Bari Mayans , BSN, RN [facility]   VBCI-Population Health RN Care Manager Direct Dial [phone]  Fax: [phone] Website: [email or web address]

## 2023-04-29 ENCOUNTER — Other Ambulatory Visit: Payer: Medicare Other

## 2023-04-29 NOTE — Patient Outreach (Signed)
  Care Management  Transitions of Care Program Transitions of Care Post-discharge week 2  04/29/2023 Name: [name] MRN: [id number] DOB: 1945/07/23  Subjective: [name] is a 78 y.o. year old female who is a primary care patient of Hasanaj, Myra Gianotti, MD. The Care Management team was unable to reach the patient by phone to assess and address transitions of care needs.   Patient  Outreach attempt is in the course of  VBCI  30-day TOC program. Pt previously agreed and is enrolled in the  program due to potential risk for readmission and/or high utilization. Unfortunately, I was not able to speak with the patient in regards to recent hospital discharge   Left  a HIPAA compliant phone message message for patient including VBCI CM contact information with request for a call back in regards to recent hospital discharge and TOC  status review       Plan: Additional outreach attempts will be made to reach the patient enrolled in the [facility] Program (Post Inpatient/ED Visit).  Susa Loffler , BSN, RN [facility]   VBCI-Population Health RN Care Manager Direct Dial [phone]  Fax: [phone] Website: [email or web address]

## 2023-05-04 ENCOUNTER — Telehealth: Payer: Self-pay

## 2023-05-04 NOTE — Patient Outreach (Signed)
  Care Management  Transitions of Care Program Transitions of Care Post-discharge week 2  05/04/2023 Name: ALEAH AHLGRIM MRN: [id number] DOB: 1946-02-13  Subjective: [name] is a 78 y.o. year old female who is a primary care patient of Hasanaj, Myra Gianotti, MD. The Care Management team was unable to reach the patient by phone to assess and address transitions of care needs.  Patient  Outreach attempt is in the course of  [facility]BC[facility]  30-day TOC program. Pt previously agreed and is enrolled in the  program due to potential risk for readmission and/or high utilization. Unfortunately, I was not able to speak with the patient in regards to recent hospital discharge   Left  a HIPAA compliant phone message message for patient including [facility]BCI CM contact information with request for a call back in regards to recent hospital discharge and TOC  status review     Plan: Additional outreach attempts will be made to reach the patient enrolled in the [facility] Program (Post Inpatient/ED Visit).  Susa Loffler , BSN, RN [facility]   [facility]-Population Health RN Care Manager Direct Dial [phone]  Fax: [phone] Website: [email or web address]

## 2023-05-05 ENCOUNTER — Telehealth: Payer: Self-pay

## 2023-05-05 NOTE — Patient Outreach (Signed)
  Care Management  Transitions of Care Program Transitions of Care Post-discharge week 2  05/05/2023 Name: Alexis Ortega MRN: [id number] DOB: 14-Jun-1945  Subjective: Alexis Ortega is a 78 y.o. year old female who is a primary care patient of Hasanaj, Myra Gianotti, MD. The Care Management team was unable to reach the patient by phone to assess and address transitions of care needs.  The patient was previously provided with contact information for the [facility] care management team and has been advised to call with any health related questions or concerns.   Three attempts have been made to reach the patient . Attempt 1    2/13  /2025 Attempt 2  2/19  /2025  Based on the VBCI program guidelines, if  we are unable to reach the patient  after 3 attempts, no additional outreach attempts will be made and the TOC follow-up will be closed Unfortunately, we have been unable to make contact with the patient for follow up.   The [facility] Case Management Team is available to follow up with the patient after provider conversation with the patient regarding recommendation for care management engagement and subsequent re-referral to the case management team.The VBCI CM team can be reached by calling [phone]. Marland Kitchen    Plan:  No further outreach attempts will be made at this time.  We have been unable to reach the patient. Susa Loffler , BSN, RN [facility]   VBCI-Population Health RN Care Manager Direct Dial [phone]  Fax: [phone] Website: [email or web address]

## 2023-06-14 DIAGNOSIS — M5003 Cervical disc disorder with myelopathy, cervicothoracic region: Secondary | ICD-10-CM | POA: Diagnosis not present

## 2023-06-14 DIAGNOSIS — E032 Hypothyroidism due to medicaments and other exogenous substances: Secondary | ICD-10-CM | POA: Diagnosis not present

## 2023-06-14 DIAGNOSIS — J449 Chronic obstructive pulmonary disease, unspecified: Secondary | ICD-10-CM | POA: Diagnosis not present

## 2023-06-14 DIAGNOSIS — M5459 Other low back pain: Secondary | ICD-10-CM | POA: Diagnosis not present

## 2023-06-14 DIAGNOSIS — Z0000 Encounter for general adult medical examination without abnormal findings: Secondary | ICD-10-CM | POA: Diagnosis not present

## 2023-06-16 ENCOUNTER — Encounter (INDEPENDENT_AMBULATORY_CARE_PROVIDER_SITE_OTHER): Payer: Self-pay | Admitting: *Deleted

## 2023-07-21 DIAGNOSIS — S51819A Laceration without foreign body of unspecified forearm, initial encounter: Secondary | ICD-10-CM | POA: Diagnosis not present

## 2023-07-28 DIAGNOSIS — M85851 Other specified disorders of bone density and structure, right thigh: Secondary | ICD-10-CM | POA: Diagnosis not present

## 2023-07-28 DIAGNOSIS — M8588 Other specified disorders of bone density and structure, other site: Secondary | ICD-10-CM | POA: Diagnosis not present

## 2023-07-28 DIAGNOSIS — M85852 Other specified disorders of bone density and structure, left thigh: Secondary | ICD-10-CM | POA: Diagnosis not present

## 2023-07-28 DIAGNOSIS — Z780 Asymptomatic menopausal state: Secondary | ICD-10-CM | POA: Diagnosis not present

## 2023-07-28 DIAGNOSIS — M810 Age-related osteoporosis without current pathological fracture: Secondary | ICD-10-CM | POA: Diagnosis not present

## 2023-08-10 DIAGNOSIS — J449 Chronic obstructive pulmonary disease, unspecified: Secondary | ICD-10-CM | POA: Diagnosis not present

## 2023-08-10 DIAGNOSIS — I10 Essential (primary) hypertension: Secondary | ICD-10-CM | POA: Diagnosis not present

## 2023-08-10 DIAGNOSIS — N182 Chronic kidney disease, stage 2 (mild): Secondary | ICD-10-CM | POA: Diagnosis not present

## 2023-08-10 DIAGNOSIS — I700 Atherosclerosis of aorta: Secondary | ICD-10-CM | POA: Diagnosis not present

## 2023-08-10 DIAGNOSIS — M5003 Cervical disc disorder with myelopathy, cervicothoracic region: Secondary | ICD-10-CM | POA: Diagnosis not present

## 2023-08-10 DIAGNOSIS — E039 Hypothyroidism, unspecified: Secondary | ICD-10-CM | POA: Diagnosis not present

## 2023-08-10 DIAGNOSIS — E7849 Other hyperlipidemia: Secondary | ICD-10-CM | POA: Diagnosis not present

## 2023-09-16 DIAGNOSIS — N182 Chronic kidney disease, stage 2 (mild): Secondary | ICD-10-CM | POA: Diagnosis not present

## 2023-09-16 DIAGNOSIS — I10 Essential (primary) hypertension: Secondary | ICD-10-CM | POA: Diagnosis not present

## 2023-09-16 DIAGNOSIS — E7849 Other hyperlipidemia: Secondary | ICD-10-CM | POA: Diagnosis not present

## 2023-09-16 DIAGNOSIS — J449 Chronic obstructive pulmonary disease, unspecified: Secondary | ICD-10-CM | POA: Diagnosis not present

## 2023-09-16 DIAGNOSIS — E039 Hypothyroidism, unspecified: Secondary | ICD-10-CM | POA: Diagnosis not present

## 2023-09-16 DIAGNOSIS — M5003 Cervical disc disorder with myelopathy, cervicothoracic region: Secondary | ICD-10-CM | POA: Diagnosis not present

## 2023-09-16 DIAGNOSIS — I700 Atherosclerosis of aorta: Secondary | ICD-10-CM | POA: Diagnosis not present

## 2023-09-24 DIAGNOSIS — R0602 Shortness of breath: Secondary | ICD-10-CM | POA: Diagnosis not present

## 2023-10-20 DIAGNOSIS — L573 Poikiloderma of Civatte: Secondary | ICD-10-CM | POA: Diagnosis not present

## 2023-11-24 DIAGNOSIS — M5003 Cervical disc disorder with myelopathy, cervicothoracic region: Secondary | ICD-10-CM | POA: Diagnosis not present

## 2023-11-24 DIAGNOSIS — E7849 Other hyperlipidemia: Secondary | ICD-10-CM | POA: Diagnosis not present

## 2023-11-24 DIAGNOSIS — I10 Essential (primary) hypertension: Secondary | ICD-10-CM | POA: Diagnosis not present

## 2023-11-24 DIAGNOSIS — J449 Chronic obstructive pulmonary disease, unspecified: Secondary | ICD-10-CM | POA: Diagnosis not present

## 2023-11-24 DIAGNOSIS — N182 Chronic kidney disease, stage 2 (mild): Secondary | ICD-10-CM | POA: Diagnosis not present

## 2023-11-24 DIAGNOSIS — I700 Atherosclerosis of aorta: Secondary | ICD-10-CM | POA: Diagnosis not present

## 2023-11-24 DIAGNOSIS — E039 Hypothyroidism, unspecified: Secondary | ICD-10-CM | POA: Diagnosis not present

## 2023-12-16 ENCOUNTER — Encounter (INDEPENDENT_AMBULATORY_CARE_PROVIDER_SITE_OTHER): Payer: Self-pay | Admitting: *Deleted

## 2023-12-16 DIAGNOSIS — M5459 Other low back pain: Secondary | ICD-10-CM | POA: Diagnosis not present

## 2024-01-17 ENCOUNTER — Encounter: Payer: Self-pay | Admitting: Radiology

## 2024-02-04 ENCOUNTER — Other Ambulatory Visit (HOSPITAL_COMMUNITY): Payer: Self-pay | Admitting: Neurological Surgery

## 2024-02-04 DIAGNOSIS — G912 (Idiopathic) normal pressure hydrocephalus: Secondary | ICD-10-CM

## 2024-02-14 ENCOUNTER — Ambulatory Visit (HOSPITAL_COMMUNITY)
Admission: RE | Admit: 2024-02-14 | Discharge: 2024-02-14 | Disposition: A | Source: Ambulatory Visit | Attending: Neurological Surgery | Admitting: Neurological Surgery

## 2024-02-14 DIAGNOSIS — G912 (Idiopathic) normal pressure hydrocephalus: Secondary | ICD-10-CM | POA: Diagnosis present

## 2024-02-18 ENCOUNTER — Encounter (HOSPITAL_BASED_OUTPATIENT_CLINIC_OR_DEPARTMENT_OTHER): Payer: Self-pay | Admitting: Emergency Medicine

## 2024-02-18 ENCOUNTER — Emergency Department (HOSPITAL_BASED_OUTPATIENT_CLINIC_OR_DEPARTMENT_OTHER)

## 2024-02-18 ENCOUNTER — Other Ambulatory Visit: Payer: Self-pay

## 2024-02-18 ENCOUNTER — Emergency Department (HOSPITAL_BASED_OUTPATIENT_CLINIC_OR_DEPARTMENT_OTHER)
Admission: EM | Admit: 2024-02-18 | Discharge: 2024-02-19 | Disposition: A | Attending: Emergency Medicine | Admitting: Emergency Medicine

## 2024-02-18 DIAGNOSIS — S199XXA Unspecified injury of neck, initial encounter: Secondary | ICD-10-CM | POA: Diagnosis present

## 2024-02-18 DIAGNOSIS — Z7952 Long term (current) use of systemic steroids: Secondary | ICD-10-CM | POA: Diagnosis not present

## 2024-02-18 DIAGNOSIS — M25462 Effusion, left knee: Secondary | ICD-10-CM | POA: Insufficient documentation

## 2024-02-18 DIAGNOSIS — Z96652 Presence of left artificial knee joint: Secondary | ICD-10-CM | POA: Diagnosis not present

## 2024-02-18 DIAGNOSIS — J45909 Unspecified asthma, uncomplicated: Secondary | ICD-10-CM | POA: Diagnosis not present

## 2024-02-18 DIAGNOSIS — S060X0A Concussion without loss of consciousness, initial encounter: Secondary | ICD-10-CM | POA: Insufficient documentation

## 2024-02-18 DIAGNOSIS — S39012A Strain of muscle, fascia and tendon of lower back, initial encounter: Secondary | ICD-10-CM

## 2024-02-18 DIAGNOSIS — E039 Hypothyroidism, unspecified: Secondary | ICD-10-CM | POA: Insufficient documentation

## 2024-02-18 DIAGNOSIS — Z79899 Other long term (current) drug therapy: Secondary | ICD-10-CM | POA: Diagnosis not present

## 2024-02-18 DIAGNOSIS — S298XXA Other specified injuries of thorax, initial encounter: Secondary | ICD-10-CM

## 2024-02-18 DIAGNOSIS — R0602 Shortness of breath: Secondary | ICD-10-CM | POA: Diagnosis present

## 2024-02-18 DIAGNOSIS — S29012A Strain of muscle and tendon of back wall of thorax, initial encounter: Secondary | ICD-10-CM | POA: Insufficient documentation

## 2024-02-18 DIAGNOSIS — I10 Essential (primary) hypertension: Secondary | ICD-10-CM | POA: Insufficient documentation

## 2024-02-18 DIAGNOSIS — S3991XA Unspecified injury of abdomen, initial encounter: Secondary | ICD-10-CM | POA: Diagnosis present

## 2024-02-18 DIAGNOSIS — S29001A Unspecified injury of muscle and tendon of front wall of thorax, initial encounter: Secondary | ICD-10-CM | POA: Insufficient documentation

## 2024-02-18 DIAGNOSIS — W01198A Fall on same level from slipping, tripping and stumbling with subsequent striking against other object, initial encounter: Secondary | ICD-10-CM | POA: Insufficient documentation

## 2024-02-18 DIAGNOSIS — S161XXA Strain of muscle, fascia and tendon at neck level, initial encounter: Secondary | ICD-10-CM | POA: Insufficient documentation

## 2024-02-18 DIAGNOSIS — W07XXXA Fall from chair, initial encounter: Secondary | ICD-10-CM | POA: Diagnosis not present

## 2024-02-18 LAB — CBC
HCT: 34.2 % — ABNORMAL LOW (ref 36.0–46.0)
Hemoglobin: 11.7 g/dL — ABNORMAL LOW (ref 12.0–15.0)
MCH: 31.1 pg (ref 26.0–34.0)
MCHC: 34.2 g/dL (ref 30.0–36.0)
MCV: 91 fL (ref 80.0–100.0)
Platelets: 273 K/uL (ref 150–400)
RBC: 3.76 MIL/uL — ABNORMAL LOW (ref 3.87–5.11)
RDW: 13.9 % (ref 11.5–15.5)
WBC: 12.4 K/uL — ABNORMAL HIGH (ref 4.0–10.5)
nRBC: 0 % (ref 0.0–0.2)

## 2024-02-18 MED ORDER — ONDANSETRON HCL 4 MG/2ML IJ SOLN
4.0000 mg | Freq: Once | INTRAMUSCULAR | Status: AC
  Administered 2024-02-18: 4 mg via INTRAVENOUS
  Filled 2024-02-18: qty 2

## 2024-02-18 MED ORDER — FENTANYL CITRATE (PF) 50 MCG/ML IJ SOSY
50.0000 ug | PREFILLED_SYRINGE | Freq: Once | INTRAMUSCULAR | Status: AC
  Administered 2024-02-18: 50 ug via INTRAVENOUS
  Filled 2024-02-18: qty 1

## 2024-02-18 NOTE — ED Triage Notes (Signed)
 Standing on chair to reach something in the cabinet Fall  Hit back on head pain , right ribs and chest Happened around 8:45pm Denies loc Some sob

## 2024-02-18 NOTE — ED Provider Notes (Signed)
 Alexis Ortega AT [facility] Provider Note   CSN: [id number] Arrival date & time: 02/18/24  2237     Patient presents with: Alexis Ortega Alexis Ortega is a 78 y.o. female.   The history is provided by the patient and a relative.  Fall This is a new problem. The current episode started 3 to 5 hours ago. The problem occurs constantly. The problem has been rapidly worsening. Associated symptoms include chest pain, abdominal pain, headaches and shortness of breath. Nothing relieves the symptoms.   Patient with history of arthritis, fibromyalgia, chronic pain, hypertension presents after accidental fall. Patient reports she was standing on a chair in the kitchen to reach something when she fell backward striking her head on the ground.  She had immediate pain in her head and neck and chest.  No LOC.  She reports mild shortness of breath.  She is not on anticoagulation   Past Medical History:  Diagnosis Date   Arthritis    Asthma    Depression    Fibromyalgia    GERD (gastroesophageal reflux disease)    Hypertension    Hypothyroidism    Thyroid  disease     Prior to Admission medications   Medication Sig Start Date End Date Taking? Authorizing Provider  acetaminophen  (TYLENOL ) 500 MG tablet Take 500 mg by mouth every 6 (six) hours as needed for moderate pain or mild pain.    [provider]  ALPRAZolam  (XANAX ) 0.5 MG tablet Take 0.5 mg by mouth at bedtime.    [provider]  amLODipine  (NORVASC ) 10 MG tablet Take 10 mg by mouth daily. 03/30/14   [provider]  citalopram  (CELEXA ) 20 MG tablet Take 20 mg by mouth daily.    [provider]  DULoxetine (CYMBALTA) 20 MG capsule Take 20 mg by mouth daily. Patient not taking: Reported on 04/23/2023 03/22/23   [provider]  hydrocortisone  (ANUSOL -HC) 2.5 % rectal cream Place 1 Application rectally 4 (four) times daily. 01/04/23   Carlan, Chelsea L, NP   ipratropium-albuterol  (DUONEB) 0.5-2.5 (3) MG/3ML SOLN Take 3 mLs by nebulization every 6 (six) hours as needed. 04/21/23   Evonnie Lenis, MD  levothyroxine  (SYNTHROID ) 88 MCG tablet Take 88 mcg by mouth daily. 03/22/23   [provider]  losartan  (COZAAR ) 100 MG tablet Take 100 mg by mouth daily.    [provider]  Multiple Vitamin (MULTIVITAMIN WITH MINERALS) TABS tablet Take 1 tablet by mouth daily.    [provider]  ondansetron  (ZOFRAN  ODT) 4 MG disintegrating tablet 4mg  ODT q4 hours prn nausea/vomit Patient taking differently: Take 4 mg by mouth every 4 (four) hours as needed for nausea or vomiting. 4mg  ODT q4 hours prn nausea/vomit 03/26/19   Zammit, Joseph, MD  pravastatin  (PRAVACHOL ) 40 MG tablet Take 40 mg by mouth daily. 03/22/23   [provider]  predniSONE  (DELTASONE ) 10 MG tablet Take 6 tablets (60 mg total) by mouth daily with breakfast. And decrease by one tablet daily 04/22/23   Tat, Lenis, MD  PROAIR  HFA 108 (90 BASE) MCG/ACT inhaler Inhale 2 puffs into the lungs every 4 (four) hours as needed for wheezing.  08/24/13   [provider]  Zinc 50 MG CAPS Take 50 mg by mouth daily.    [provider]    Allergies: Cefuroxime, Gabapentin, Diclofenac, and Tramadol     Review of Systems  Respiratory:  Positive for shortness of breath.   Cardiovascular:  Positive for chest pain.  Gastrointestinal:  Positive for abdominal pain.  Neurological:  Positive for headaches.    Updated Vital Signs BP 126/69   Pulse 66   Temp 97.6 F (36.4 C) (Oral)   Resp 16   SpO2 93%   Physical Exam CONSTITUTIONAL: Elderly, uncomfortable appearing HEAD: Hematoma noted to posterior scalp, no other signs of trauma EYES: EOMI/PERRL ENMT: Mucous membranes moist, no facial trauma SPINE/BACK: Diffuse C/T/L tenderness No bruising/crepitance/stepoffs noted to spine Patient maintained in spinal precautions/logroll utilized CV: S1/S2 noted, no  murmurs/rubs/gallops noted LUNGS: Lungs are clear to auscultation bilaterally, no apparent distress Chest-diffuse tenderness bilaterally, no crepitance, no deformities ABDOMEN: soft, bilateral upper quadrant tenderness, no bruising GU:no cva tenderness, no bruising NEURO: Pt is awake/alert/appropriate, moves all extremitiesx4.  No facial droop.  Moves all 4 extremities without difficulty EXTREMITIES: pulses normal/equal, full ROM Pelvis stable All other extremities/joints palpated/ranged and nontender SKIN: warm, color normal PSYCH: This  (all labs ordered are listed, but only abnormal results are displayed) Labs Reviewed  CBC - Abnormal; Notable for the following components:      Result Value   WBC 12.4 (*)    RBC 3.76 (*)    Hemoglobin 11.7 (*)    HCT 34.2 (*)    All other components within normal limits  COMPREHENSIVE METABOLIC PANEL WITH GFR    EKG: EKG Interpretation Date/Time:  Saturday February 19 2024 01:17:03 EST Ventricular Rate:  71 PR Interval:  216 QRS Duration:  106 QT Interval:  427 QTC Calculation: 464 R Axis:   51  Text Interpretation: Sinus rhythm Borderline prolonged PR interval Interpretation limited secondary to artifact Confirmed by Midge Golas ([id number]) on 02/19/2024 1:20:06 AM  Radiology: CT T-SPINE NO CHARGE Addendum Date: 02/19/2024 ** ADDENDUM #2 ** ADDENDUM: comparison: mri thoracic spine 01/01/23 Cortical irregularity noted along the inferior endplate of the T12 level on MRI thoracic spine 2024 with a superimposed acute fracture on today's CT best evaluated on coronal images (4:58 - 59). ---------------------------------------------------- Electronically signed by: Kate Plummer MD 02/19/2024 02:20 AM EST RP Workstation: [id number]   Addendum Date: 02/19/2024 ** ADDENDUM #1 ** ADDENDUM: mri thoracic spine 01/01/23 ---------------------------------------------------- Electronically signed by: Kate Plummer MD 02/19/2024 01:21 AM EST RP  Workstation: [id number]   Result Date: 02/19/2024 ** ORIGINAL REPORT ** EXAM: CT THORACIC SPINE WITHOUT CONTRAST 02/19/2024 01:08:47 AM TECHNIQUE: CT of the thoracic spine was performed without the administration of intravenous contrast. Multiplanar reformatted images are provided for review. Automated exposure control, iterative reconstruction, and/or weight based adjustment of the mA/kV was utilized to reduce the radiation dose to as low as reasonably achievable. COMPARISON: None available. CLINICAL HISTORY: FINDINGS: BONES AND ALIGNMENT: Diffusely decreased bone density. Multilevel chronic vertebral body height loss. No acute fracture or suspicious bone lesion. Normal alignment. Question: Acute nondisplaced fracture of the T12 anterior inferior. DEGENERATIVE CHANGES: Multilevel mild degenerative changes of the spine. Mild intervertebral disk space narrowing. SOFT TISSUES: No acute abnormality. IMPRESSION: 1. Question acute nondisplaced fracture of the T12 anterior inferior vertebral body. 2. Diffusely decreased bone density. Electronically signed by: Morgane Naveau MD 02/19/2024 01:16 AM EST RP Workstation: [id number]   CT L-SPINE NO CHARGE Result Date: 02/19/2024 EXAM: CT OF THE LUMBAR SPINE WITHOUT CONTRAST 02/19/2024 01:08:47 AM TECHNIQUE: CT of the lumbar spine was performed without the administration of intravenous contrast. Multiplanar reformatted images are provided for review. Automated exposure control, iterative reconstruction, and/or weight based adjustment of the mA/kV was utilized to reduce the radiation dose to as low as reasonably achievable. COMPARISON: MRI lumbar  spine 01/28/2023. CLINICAL HISTORY: FINDINGS: BONES AND ALIGNMENT: Normal vertebral body heights. Old healed fracture or congenital nonunion of the L4 spinous process. Normal alignment. No scoliosis. No acute fracture or suspicious bone lesion. DEGENERATIVE CHANGES: Intervertebral disc space vacuum phenomenon. Multilevel mild  degenerative changes of the spine. No foraminal or central canal stenosis. SOFT TISSUES: No acute abnormality. IMPRESSION: 1. No acute findings. Electronically signed by: Morgane Naveau MD 02/19/2024 01:26 AM EST RP Workstation: [id number]   CT CERVICAL SPINE WO CONTRAST Result Date: 02/19/2024 EXAM: CT CERVICAL SPINE WITHOUT CONTRAST 02/19/2024 01:07:53 AM TECHNIQUE: CT of the cervical spine was performed without the administration of intravenous contrast. Multiplanar reformatted images are provided for review. Automated exposure control, iterative reconstruction, and/or weight based adjustment of the mA/kV was utilized to reduce the radiation dose to as low as reasonably achievable. COMPARISON: ct c spine 4/8/4 CLINICAL HISTORY: Polytrauma, blunt FINDINGS: CERVICAL SPINE: BONES AND ALIGNMENT: C4-C6 anterior cervical discectomy and fusion. No CT evidence of surgical hardware complication. No definite acute fracture or traumatic malalignment. DEGENERATIVE CHANGES: Multilevel mild degenerative changes of the spine. No associated severe osseous neural foraminal or central canal stenosis. SOFT TISSUES: No prevertebral soft tissue swelling. LUNGS: Paracentral and centrilobular emphysematous changes. IMPRESSION: 1. No acute abnormality of the cervical spine related to the polytrauma. 2. Incidental pulmonary emphysema; consider evaluation for a low-dose CT lung cancer screening program given emphysema as an independent risk factor for lung cancer. Electronically signed by: Morgane Naveau MD 02/19/2024 01:20 AM EST RP Workstation: [id number]   CT CHEST ABDOMEN PELVIS W CONTRAST Result Date: 02/19/2024 EXAM: CT CHEST, ABDOMEN AND PELVIS WITH CONTRAST 02/19/2024 01:08:47 AM TECHNIQUE: CT of the chest, abdomen and pelvis was performed with the administration of 100 mL of iohexol  (OMNIPAQUE ) 300 MG/ML solution. Multiplanar reformatted images are provided for review. Automated exposure control, iterative reconstruction,  and/or weight based adjustment of the mA/kV was utilized to reduce the radiation dose to as low as reasonably achievable. COMPARISON: None available. CLINICAL HISTORY: Polytrauma, blunt. FINDINGS: CHEST: MEDIASTINUM AND LYMPH NODES: 4 vessel coronary artery calcification. Heart and pericardium are unremarkable. The central airways are clear. No mediastinal, hilar or axillary lymphadenopathy. Small hiatal hernia. LUNGS AND PLEURA: Moderate centrilobular emphysematous changes. Mild paraseptal and subpleural changes. No focal consolidation or pulmonary edema. No pleural effusion or pneumothorax. ABDOMEN AND PELVIS: LIVER: The liver is unremarkable. GALLBLADDER AND BILE DUCTS: Status post cholecystectomy. No biliary ductal dilatation. SPLEEN: No acute abnormality. PANCREAS: No acute abnormality. ADRENAL GLANDS: No acute abnormality. KIDNEYS, URETERS AND BLADDER: Fluid dense lesions of the kidneys likely represents renal cysts. Simple renal cysts do not require additional follow-up unless clinically indicated due to signs/symptoms. No stones in the kidneys or ureters. No hydronephrosis. No perinephric or periureteral stranding. Urinary bladder is unremarkable. GI AND BOWEL: Stomach demonstrates no acute abnormality. Colonic diverticulosis. There is no bowel obstruction. REPRODUCTIVE ORGANS: No acute abnormality. PERITONEUM AND RETROPERITONEUM: No ascites. No free air. VASCULATURE: Aorta is normal in caliber. Severe atherosclerotic plaque. ABDOMINAL AND PELVIS LYMPH NODES: No lymphadenopathy. REPRODUCTIVE ORGANS: No acute abnormality. BONES AND SOFT TISSUES: No acute osseous abnormality. No focal soft tissue abnormality. No acute fracture or dislocation. Please see separately dictated CT thoracolumbar spine. IMPRESSION: 1. No acute abnormality of the chest, abdomen, or pelvis related to polytrauma. 2. Other, non-acute and/or normal findings as above. Electronically signed by: Morgane Naveau MD 02/19/2024 01:14 AM EST RP  Workstation: [id number]   CT HEAD WO CONTRAST Result Date: 02/19/2024 EXAM: CT HEAD WITHOUT CONTRAST 02/19/2024  01:07:53 AM TECHNIQUE: CT of the head was performed without the administration of intravenous contrast. Automated exposure control, iterative reconstruction, and/or weight based adjustment of the mA/kV was utilized to reduce the radiation dose to as low as reasonably achievable. COMPARISON: None available. CLINICAL HISTORY: Head trauma, moderate-severe. FINDINGS: BRAIN AND VENTRICLES: No acute hemorrhage. No evidence of acute infarct. No hydrocephalus. No extra-axial collection. No mass effect or midline shift. Patchy and confluent areas of decreased attenuation are noted throughout the deep and periventricular white matter of the cerebral hemispheres bilaterally suggestive of chronic microvascular ischemic changes. Atherosclerotic calcifications are present within the cavernous internal carotid and vertebral arteries. ORBITS: No acute abnormality. SINUSES: No acute abnormality. SOFT TISSUES AND SKULL: No acute soft tissue abnormality. No skull fracture. IMPRESSION: 1. No acute intracranial abnormality. Electronically signed by: Morgane Naveau MD 02/19/2024 01:11 AM EST RP Workstation: [id number]   DG Knee Complete 4 Views Left Result Date: 02/19/2024 EXAM: 4 VIEW(S) XRAY OF THE LEFT KNEE 02/19/2024 12:36:00 AM COMPARISON: X-ray left knee 06/01/2012. CLINICAL HISTORY: pain FINDINGS: BONES AND JOINTS: Total knee arthroplasty noted. Intact orthopedic hardware without periprosthetic fracture or lucency. Small suprapatellar effusion. SOFT TISSUES: Vascular calcifications. IMPRESSION: 1. Total knee arthroplasty with intact orthopedic hardware without periprosthetic fracture or lucency. 2. Small suprapatellar effusion. Electronically signed by: Morgane Naveau MD 02/19/2024 12:48 AM EST RP Workstation: [id number]   DG Chest Port 1 View Result Date: 02/18/2024 CLINICAL DATA:  Clemens, rib pain EXAM: PORTABLE  CHEST 1 VIEW COMPARISON:  04/19/2023 FINDINGS: Single frontal view of the chest demonstrates an unremarkable cardiac silhouette. No airspace disease, effusion, or pneumothorax. No acute displaced fracture. IMPRESSION: 1. No acute intrathoracic process. Electronically Signed   By: Ozell Daring M.D.   On: 02/18/2024 23:55     Procedures   Medications Ordered in the ED  fentaNYL  (SUBLIMAZE ) injection 50 mcg (50 mcg Intravenous Given 02/18/24 2359)  ondansetron  (ZOFRAN ) injection 4 mg (4 mg Intravenous Given 02/18/24 2359)  iohexol  (OMNIPAQUE ) 300 MG/ML solution 100 mL (100 mLs Intravenous Contrast Given 02/19/24 0108)  fentaNYL  (SUBLIMAZE ) injection 50 mcg (50 mcg Intravenous Given 02/19/24 0135)  HYDROcodone -acetaminophen  (NORCO/VICODIN) 5-325 MG per tablet 1 tablet (1 tablet Oral Given 02/19/24 0345)  ketorolac  (TORADOL ) 15 MG/ML injection 15 mg (15 mg Intravenous Given 02/19/24 0344)    Clinical Course as of 02/19/24 0436  Fri Feb 18, 2024  2348 Patient presents after accidental fall from chair.  She is not a significant discomfort with pain throughout her spine as well as chest and abdomen. Trauma imaging has been ordered. Patient noted to have pain patch to her back [DW]  Sat Feb 19, 2024  0331 Discussed imaging with Dr. Darnella with nsgy He has reviewed imaging  He does not feel this represents acute fracture  [DW]    Clinical Course User Index [DW] Midge Golas, MD                                 Medical Decision Making Amount and/or Complexity of Data Reviewed Labs: ordered. Radiology: ordered. ECG/medicine tests: ordered.  Risk Prescription drug management.   This patient presents to the ED for concern of fall and pain, this involves an extensive number of treatment options, and is a complaint that carries with it a high risk of complications and morbidity.  The differential diagnosis includes but is not limited to subdural hematoma, subarachnoid hemorrhage, skull  fracture, concussion, cervical spine fracture, blunt  chest trauma, blunt abdominal trauma   Comorbidities that complicate the patient evaluation: Patient's presentation is complicated by their history of chronic pain  Social Determinants of Health: Patient's poor mobility  increases the complexity of managing their presentation  Additional history obtained: Additional history obtained from family Records reviewed outpatient records reviewed  Lab Tests: I Ordered, and personally interpreted labs.  The pertinent results include: Labs reassuring  Imaging Studies ordered: I ordered imaging studies including CT scan trauma imaging  I independently visualized and interpreted imaging which showed no acute traumatic injuries I agree with the radiologist interpretation   Medicines ordered and prescription drug management: I ordered medication including fentanyl  for pain Reevaluation of the patient after these medicines showed that the patient    improved    Consultations Obtained: I requested consultation with the radiologist naveau and consultant neurosurgery, and discussed  findings as well as pertinent plan - they recommend: Neurosurgery does not feel there is an acute thoracic fracture  Reevaluation: After the interventions noted above, I reevaluated the patient and found that they have :improved  Complexity of problems addressed: Patient's presentation is most consistent with  acute presentation with potential threat to life or bodily function  Disposition: After consideration of the diagnostic results and the patient's response to treatment,  I feel that the patent would benefit from discharge  .   Extensive imaging is overall unremarkable Patient is able to walk, but does have pain continuing in her right ribs.  We discussed all of her imaging without any signs of acute fracture or traumatic injury I agreed to give her a one-time dose of IV Toradol  and oral narcotic and  patient had improvement.  Vital signs appropriate. Patient is already on home pain meds/pain patches, therefore we will be unable to provide any prescriptions.  She will follow-up with her specialist next week     Final diagnoses:  Concussion without loss of consciousness, initial encounter  Strain of neck muscle, initial encounter  Back strain, initial encounter  Blunt trauma to chest, initial encounter    ED Discharge Orders     None          Midge Golas, MD 02/19/24 [phone]

## 2024-02-19 ENCOUNTER — Emergency Department (HOSPITAL_BASED_OUTPATIENT_CLINIC_OR_DEPARTMENT_OTHER)

## 2024-02-19 LAB — COMPREHENSIVE METABOLIC PANEL WITH GFR
ALT: 23 U/L (ref 0–44)
AST: 28 U/L (ref 15–41)
Albumin: 4.5 g/dL (ref 3.5–5.0)
Alkaline Phosphatase: 77 U/L (ref 38–126)
Anion gap: 12 (ref 5–15)
BUN: 21 mg/dL (ref 8–23)
CO2: 25 mmol/L (ref 22–32)
Calcium: 10 mg/dL (ref 8.9–10.3)
Chloride: 101 mmol/L (ref 98–111)
Creatinine, Ser: 0.84 mg/dL (ref 0.44–1.00)
GFR, Estimated: >60 mL/min (ref >60)
Glucose, Bld: 97 mg/dL (ref 70–99)
Potassium: 3.9 mmol/L (ref 3.5–5.1)
Sodium: 138 mmol/L (ref 135–145)
Total Bilirubin: 0.3 mg/dL (ref 0.0–1.2)
Total Protein: 7.2 g/dL (ref 6.5–8.1)

## 2024-02-19 MED ORDER — IOHEXOL 300 MG/ML  SOLN
100.0000 mL | Freq: Once | INTRAMUSCULAR | Status: AC | PRN
  Administered 2024-02-19: 100 mL via INTRAVENOUS

## 2024-02-19 MED ORDER — KETOROLAC TROMETHAMINE 15 MG/ML IJ SOLN
15.0000 mg | Freq: Once | INTRAMUSCULAR | Status: AC
  Administered 2024-02-19: 15 mg via INTRAVENOUS
  Filled 2024-02-19: qty 1

## 2024-02-19 MED ORDER — FENTANYL CITRATE (PF) 50 MCG/ML IJ SOSY
50.0000 ug | PREFILLED_SYRINGE | Freq: Once | INTRAMUSCULAR | Status: AC
  Administered 2024-02-19: 50 ug via INTRAVENOUS
  Filled 2024-02-19: qty 1

## 2024-02-19 MED ORDER — HYDROCODONE-ACETAMINOPHEN 5-325 MG PO TABS
1.0000 | ORAL_TABLET | Freq: Once | ORAL | Status: AC
  Administered 2024-02-19: 1 via ORAL
  Filled 2024-02-19: qty 1

## 2024-02-19 NOTE — ED Notes (Addendum)
 Pt ambulated to door of room and back to stretcher with 1 person assist. Tolerated fair. Reports pain 10/10 to Rt rib cage. Currently sitting up in a chair. Family at bedside. Dr. Midge notified. See mar for new orders

## 2024-02-19 NOTE — ED Notes (Signed)
 Patient transported to CT

## 2024-02-19 NOTE — ED Notes (Signed)
 X-Ray at bedside.

## 2024-03-22 NOTE — Progress Notes (Signed)
 "  New Patient Pulmonology Office Visit   Subjective:  Patient ID: Alexis Ortega, female    DOB: 07-30-1945  MRN: [id number]  Referred by: Orpha Yancey DELENA, MD  CC:  Chief Complaint  Patient presents with   Establish Care    LCS  Has bronchitis / congestion / slight green mucus     HPI OVIE EASTEP is a 79 y.o. female with hx of GERD, HTN, hypothyroidism, emphysema and nicotine  dependence in remission who presents to establish care.  Discussed the use of AI scribe software for clinical note transcription with the patient, who gave verbal consent to proceed.  History of Present Illness Alexis Ortega is a 79 year old female with emphysema who presents with persistent cough and congestion.  Approximately six months ago, she experienced a fall resulting in a concussion, fractured vertebrae, and rib injuries. After recovering from the acute pain, she developed bronchitis with a deep cough and congestion. Initially, her breathing was shallow.  She received a double shot of cortisone and was on liquid prednisone , which alleviated some symptoms. However, she remains congested and continues to expectorate mucus. Initially, the sputum was green, but it has since become less dark and more mucus-like, though not completely clear. No fevers, chills, or night sweats. Her oxygen  level was measured at 99% at home, and she reports no current wheezing.  She has been told she has some emphysema in the past, and a recent CT scan was performed following her fall. She smoked for approximately 50 years, starting at age 71 and quitting four years ago. She experiences occasional shortness of breath on exertion, particularly when climbing stairs, and uses a nebulizer with albuterol  every four hours as needed, recently reduced to twice a day. She seldom uses her albuterol  inhaler.  Her past medical history includes hypertension, depression, and hyperlipidemia, for which she takes medication. She has an  artificial knee and experiences back problems, which contribute to her difficulty keeping up with her husband during walks.  MMRC > 2, 1 exacerbation in the last 4 years  ROS  Allergies: Cefuroxime, Gabapentin, Diclofenac, and Tramadol  Current Medications[1] Past Medical History:  Diagnosis Date   Arthritis    Asthma    Depression    Fibromyalgia    GERD (gastroesophageal reflux disease)    Hypertension    Hypothyroidism    Thyroid  disease    Past Surgical History:  Procedure Laterality Date   ABDOMINAL HYSTERECTOMY     BACK SURGERY     BIOPSY  03/29/2020   Procedure: BIOPSY;  Surgeon: Eartha Angelia Sieving, MD;  Location: [facility];  Service: Gastroenterology;;   CARPAL TUNNEL RELEASE Right    CHOLECYSTECTOMY     CLOSED REDUCTION RADIAL SHAFT Left 06/29/2022   Procedure: ATTTEMPTED CLOSED REDUCTION RADIAL SHAFT;  Surgeon: Onesimo Oneil DELENA, MD;  Location: [facility];  Service: Orthopedics;  Laterality: Left;  Closed reduction left distal radius fracture   ESOPHAGEAL DILATION N/A 03/29/2020   Procedure: ESOPHAGEAL DILATION;  Surgeon: Eartha Angelia Sieving, MD;  Location: [facility];  Service: Gastroenterology;  Laterality: N/A;   ESOPHAGOGASTRODUODENOSCOPY (EGD) WITH PROPOFOL  N/A 03/29/2020   Procedure: ESOPHAGOGASTRODUODENOSCOPY (EGD) WITH PROPOFOL ;  Surgeon: Eartha Angelia Sieving, MD;  Location: [facility];  Service: Gastroenterology;  Laterality: N/A;  9:00   EYE SURGERY     cataract with lens implant   KNEE ARTHROSCOPY Left    LEFT HEART CATHETERIZATION WITH CORONARY ANGIOGRAM N/A 10/02/2013   Procedure: LEFT HEART CATHETERIZATION  WITH CORONARY ANGIOGRAM;  Surgeon: Dorn JINNY Lesches, MD;  Location: [facility] CATH LAB;  Service: Cardiovascular;  Laterality: N/A;   NECK SURGERY     PERCUTANEOUS PINNING Left 06/29/2022   Procedure: PERCUTANEOUS PINNING OF DISTAL RADIUS;  Surgeon: Onesimo Oneil LABOR, MD;  Location: [facility];  Service: Orthopedics;  Laterality: Left;  Closed  reduction and pinning of distal radius fracture   TOTAL KNEE ARTHROPLASTY Left 06/01/2012   Procedure: LEFT TOTAL KNEE ARTHROPLASTY;  Surgeon: Toribio JULIANNA Chancy, MD;  Location: [facility] OR;  Service: Orthopedics;  Laterality: Left;   TUBAL LIGATION     Family History  Problem Relation Age of Onset   Allergies Son    Asthma Daughter    Social History   Socioeconomic History   Marital status: Married    Spouse name: Not on file   Number of children: Not on file   Years of education: Not on file   Highest education level: Not on file  Occupational History   Not on file  Tobacco Use   Smoking status: Former    Current packs/day: 0.00    Average packs/day: 0.5 packs/day for 40.0 years (20.0 ttl pk-yrs)    Types: Cigarettes    Start date: 06/14/1972    Quit date: 06/14/2020    Years since quitting: 3.7    Passive exposure: Past   Smokeless tobacco: Never  Vaping Use   Vaping status: Never Used  Substance and Sexual Activity   Alcohol use: No   Drug use: Not Currently    Comment: fentanyl  patch Q three days   Sexual activity: Not Currently  Other Topics Concern   Not on file  Social History Narrative   Not on file   Social Drivers of Health   Tobacco Use: Medium Risk (03/23/2024)   Patient History    Smoking Tobacco Use: Former    Smokeless Tobacco Use: Never    Passive Exposure: Past  Physicist, Medical Strain: Not on file  Food Insecurity: No Food Insecurity (04/23/2023)   Hunger Vital Sign    Worried About Running Out of Food in the Last Year: Never true    Ran Out of Food in the Last Year: Never true  Transportation Needs: No Transportation Needs (04/23/2023)   PRAPARE - Administrator, Civil Service (Medical): No    Lack of Transportation (Non-Medical): No  Physical Activity: Not on file  Stress: Not on file  Social Connections: Socially Integrated (04/23/2023)   Social Connection and Isolation Panel    Frequency of Communication with Friends and Family: More than  three times a week    Frequency of Social Gatherings with Friends and Family: More than three times a week    Attends Religious Services: More than 4 times per year    Active Member of [employer] or Organizations: Yes    Attends Engineer, Structural: More than 4 times per year    Marital Status: Married  Catering Manager Violence: Not At Risk (04/23/2023)   Humiliation, Afraid, Rape, and Kick questionnaire    Fear of Current or Ex-Partner: No    Emotionally Abused: No    Physically Abused: No    Sexually Abused: No  Depression (PHQ2-9): Not on file  Alcohol Screen: Not on file  Housing: Low Risk (04/23/2023)   Housing Stability Vital Sign    Unable to Pay for Housing in the Last Year: No    Number of Times Moved in the Last Year: 0  Homeless in the Last Year: No  Utilities: Not At Risk (04/23/2023)   AHC Utilities    Threatened with loss of utilities: No  Health Literacy: Not on file       Objective:  BP 134/75   Pulse (!) 58   Ht 5' 4 (1.626 m)   Wt 160 lb (72.6 kg)   SpO2 99% Comment: ra  BMI 27.46 kg/m  Wt Readings from Last 3 Encounters:  03/23/24 160 lb (72.6 kg)  04/18/23 160 lb 15 oz (73 kg)  02/18/23 152 lb (68.9 kg)   BMI Readings from Last 3 Encounters:  03/23/24 27.46 kg/m  04/18/23 27.62 kg/m  02/18/23 26.09 kg/m   SpO2 Readings from Last 3 Encounters:  03/23/24 99%  02/19/24 93%  04/21/23 95%    Physical Exam General: NAD, alert, WD, WN Eyes: PERRL, no scleral icterus ENMT: oropharynx clear, good dentition, no oral lesions, mallampati score IV Skin: warm, intact, no rashes Neck: JVD flat, ROM and lymph node assessment normal CV: RRR, no MRG, nl S1 and S2, no peripheral edema Resp: expiratory wheezing left more so than right Neuro: Awake alert oriented to person place time and situation  Diagnostic Review:  Last CBC Lab Results  Component Value Date   WBC 12.4 (H) 02/18/2024   HGB 11.7 (L) 02/18/2024   HCT 34.2 (L) 02/18/2024   MCV  91.0 02/18/2024   MCH 31.1 02/18/2024   RDW 13.9 02/18/2024   PLT 273 02/18/2024   Last metabolic panel Lab Results  Component Value Date   GLUCOSE 97 02/18/2024   NA 138 02/18/2024   K 3.9 02/18/2024   CL 101 02/18/2024   CO2 25 02/18/2024   BUN 21 02/18/2024   CREATININE 0.84 02/18/2024   GFRNONAA >60 02/18/2024   CALCIUM  10.0 02/18/2024   PROT 7.2 02/18/2024   ALBUMIN 4.5 02/18/2024   BILITOT 0.3 02/18/2024   ALKPHOS 77 02/18/2024   AST 28 02/18/2024   ALT 23 02/18/2024   ANIONGAP 12 02/18/2024   CT C/A/P 02/2024: LUNGS AND PLEURA: Moderate centrilobular emphysematous changes. Mild paraseptal and subpleural changes. No focal consolidation or pulmonary edema. No pleural effusion or pneumothorax.    Assessment & Plan:   Assessment & Plan Centrilobular emphysema (HCC) Centrilobular emphysema Chronic centrilobular emphysema with significant upper lung involvement based on recent CT chest. No supplemental oxygen  needed. Symptoms include exertional dyspnea and occasional wheezing. Smoking cessation for four years will slow progression. Recent CT scan showed no concerning lung nodules. MMRC > 2, no recurrent exacerbations. Class B, unknown grade. - Prescribed Spiriva  inhaler, two puffs once daily. - Educated on Spiriva  inhaler use: twist, open, push, inhale, hold for 5-10 seconds. - Continue albuterol  rescue inhaler every 4-6 hours as needed. - Continue nebulizer with albuterol  twice daily as needed. - Ordered pulmonary function test to assess severity of emphysema. - Advised to avoid smoking, secondhand smoke, and exposure to fumes from wood stoves. Cigarette nicotine  dependence in remission Quit 4 years ago, 50 PY hx.  Encounter for screening for lung cancer The patient is a former cigarette smoker and has a pack year history of 50 pack years and stop smoking 4 years ago. They are currently asymptomatic and between the ages of 65 and 1 years old and has quit smoking less  than 15 years ago. Already completed a CT chest in ED without concerning nodules this year. She will not qualify for lung cancer screening due to age.  Assessment & Plan Acute bronchitis -  resolving. Recent acute bronchitis episode post-fall, treated with steroids and prednisone , showing improvement. Current symptoms include reduced sputum, no fever, chills, or night sweats. Oxygen  saturation at 99%. - Continue current management with albuterol  and nebulizer as needed.   Orders Placed This Encounter  Procedures   Pulmonary function test   I spent 45 minutes reviewing patient's chart including prior consultant notes, imaging, and PFTs as well as face-to-face with the patient, over half in discussion of the diagnosis and the importance of compliance with the treatment plan.  Return in about 4 months (around 07/21/2024).   Dauntae Derusha, MD     [1]  Current Outpatient Medications:    acetaminophen  (TYLENOL ) 500 MG tablet, Take 500 mg by mouth every 6 (six) hours as needed for moderate pain or mild pain., Disp: , Rfl:    ALPRAZolam  (XANAX ) 0.5 MG tablet, Take 0.5 mg by mouth at bedtime., Disp: , Rfl:    amLODipine  (NORVASC ) 10 MG tablet, Take 10 mg by mouth daily., Disp: , Rfl:    citalopram  (CELEXA ) 20 MG tablet, Take 20 mg by mouth daily., Disp: , Rfl:    DULoxetine (CYMBALTA) 20 MG capsule, Take 20 mg by mouth daily., Disp: , Rfl:    hydrocortisone  (ANUSOL -HC) 2.5 % rectal cream, Place 1 Application rectally 4 (four) times daily., Disp: 56 g, Rfl: 1   ipratropium-albuterol  (DUONEB) 0.5-2.5 (3) MG/3ML SOLN, Take 3 mLs by nebulization every 6 (six) hours as needed., Disp: 540 mL, Rfl: 1   levothyroxine  (SYNTHROID ) 88 MCG tablet, Take 88 mcg by mouth daily., Disp: , Rfl:    losartan  (COZAAR ) 100 MG tablet, Take 100 mg by mouth daily., Disp: , Rfl:    Multiple Vitamin (MULTIVITAMIN WITH MINERALS) TABS tablet, Take 1 tablet by mouth daily., Disp: , Rfl:    ondansetron  (ZOFRAN  ODT) 4 MG  disintegrating tablet, 4mg  ODT q4 hours prn nausea/vomit, Disp: 12 tablet, Rfl: 0   pravastatin  (PRAVACHOL ) 40 MG tablet, Take 40 mg by mouth daily., Disp: , Rfl:    predniSONE  (DELTASONE ) 10 MG tablet, Take 6 tablets (60 mg total) by mouth daily with breakfast. And decrease by one tablet daily, Disp: 21 tablet, Rfl: 0   Tiotropium Bromide (SPIRIVA  RESPIMAT) 2.5 MCG/ACT AERS, Inhale 2 puffs into the lungs daily., Disp: 1 g, Rfl: EACH   Zinc 50 MG CAPS, Take 50 mg by mouth daily., Disp: , Rfl:    albuterol  (PROAIR  HFA) 108 (90 Base) MCG/ACT inhaler, Inhale 2 puffs into the lungs every 4 (four) hours as needed for wheezing., Disp: 8.5 g, Rfl: 6  "

## 2024-03-23 ENCOUNTER — Ambulatory Visit: Admitting: Pulmonary Disease

## 2024-03-23 ENCOUNTER — Encounter: Payer: Self-pay | Admitting: Pulmonary Disease

## 2024-03-23 ENCOUNTER — Telehealth: Payer: Self-pay | Admitting: Pulmonary Disease

## 2024-03-23 VITALS — BP 134/75 | HR 58 | Ht 64.0 in | Wt 160.0 lb

## 2024-03-23 DIAGNOSIS — Z122 Encounter for screening for malignant neoplasm of respiratory organs: Secondary | ICD-10-CM

## 2024-03-23 DIAGNOSIS — J432 Centrilobular emphysema: Secondary | ICD-10-CM

## 2024-03-23 DIAGNOSIS — J209 Acute bronchitis, unspecified: Secondary | ICD-10-CM | POA: Diagnosis not present

## 2024-03-23 DIAGNOSIS — F17211 Nicotine dependence, cigarettes, in remission: Secondary | ICD-10-CM | POA: Diagnosis not present

## 2024-03-23 MED ORDER — SPIRIVA RESPIMAT 2.5 MCG/ACT IN AERS: 2.0000 | INHALATION_SPRAY | Freq: Every day | RESPIRATORY_TRACT | Status: DC

## 2024-03-23 MED ORDER — ALBUTEROL SULFATE HFA 108 (90 BASE) MCG/ACT IN AERS: 2.0000 | INHALATION_SPRAY | RESPIRATORY_TRACT | 6 refills | Status: DC | PRN

## 2024-03-23 NOTE — Patient Instructions (Signed)
" °  VISIT SUMMARY: Today, we discussed your persistent cough and congestion, which have been ongoing since your fall six months ago. We reviewed your history of emphysema and recent bronchitis, and we made some adjustments to your treatment plan to help manage your symptoms better.  YOUR PLAN: CENTRILOBULAR EMPHYSEMA: You have chronic centrilobular emphysema with significant upper lung involvement, causing shortness of breath and occasional wheezing. Your recent CT scan showed no concerning lung nodules. -Start using the Spiriva  inhaler, two puffs once daily. Remember to twist, open, push, inhale, and hold for 5-10 seconds. -Continue using your albuterol  rescue inhaler every 4-6 hours as needed. -Continue using your nebulizer with albuterol  twice daily as needed. -A pulmonary function test has been ordered to assess the severity of your emphysema. -Avoid smoking, secondhand smoke, and exposure to fumes from wood stoves.  ACUTE BRONCHITIS: You recently had an episode of acute bronchitis following your fall, which was treated with steroids and prednisone . Your symptoms have improved, but you still have some congestion and mucus. -Continue your current management with albuterol  and the nebulizer as needed.  Contains text generated by [employer].  "

## 2024-03-23 NOTE — Telephone Encounter (Signed)
 Spoke with patient regarding the Tuesday 05/09/24 10:00 am PFT appointment at [facility] time is 9:45 am---1st floor registration desk for check in---will mail information to patient and she voiced her understanding

## 2024-03-23 NOTE — Addendum Note (Signed)
 Addended by: RUFFUS LUKES T on: 03/23/2024 09:40 AM   Modules accepted: Orders

## 2024-03-29 ENCOUNTER — Telehealth: Payer: Self-pay

## 2024-03-29 ENCOUNTER — Other Ambulatory Visit: Payer: Self-pay

## 2024-03-29 DIAGNOSIS — J432 Centrilobular emphysema: Secondary | ICD-10-CM

## 2024-03-29 MED ORDER — SPIRIVA RESPIMAT 2.5 MCG/ACT IN AERS: 2.0000 | INHALATION_SPRAY | Freq: Every day | RESPIRATORY_TRACT | 11 refills | Status: DC

## 2024-03-29 NOTE — Telephone Encounter (Signed)
 Copied from CRM [id number]. Topic: Clinical - Prescription Issue >> Mar 29, 2024 10:28 AM Rozanna MATSU wrote: Reason for CRM: pt spouse [phone] Mr. Mathison wants to know if Dr A wants her to continue this med and if so can he send a refill this was a sample she had Tiotropium Bromide (SPIRIVA  RESPIMAT) 2.5 MCG/ACT AERS, please send to [id number] - [place], [place] - [address] AT [facility] [address] [place], [place] [address] Phone: [phone]  Fax: [phone]  **and does he want her to continue the albuterol . Please reach out to the pt and advise thanks.   Pls advise

## 2024-03-29 NOTE — Telephone Encounter (Signed)
 [facility] x1 call could not be completed - sent in the inhaler requested to desired pharm.

## 2024-03-29 NOTE — Telephone Encounter (Signed)
 Yes continue Spiriva  2 puffs once daily   AND albuterol  2 puffs every 4 hours as needed whenever short of breath, coughing, or wheezing.  Please submit refill order as requested by patient.

## 2024-03-30 NOTE — Telephone Encounter (Signed)
 Spoke with pts husband witney huie in [place] regarding the inhalers

## 2024-04-04 ENCOUNTER — Ambulatory Visit (INDEPENDENT_AMBULATORY_CARE_PROVIDER_SITE_OTHER): Admitting: Gastroenterology

## 2024-04-04 ENCOUNTER — Encounter (INDEPENDENT_AMBULATORY_CARE_PROVIDER_SITE_OTHER): Payer: Self-pay | Admitting: Gastroenterology

## 2024-04-04 ENCOUNTER — Telehealth (INDEPENDENT_AMBULATORY_CARE_PROVIDER_SITE_OTHER): Payer: Self-pay

## 2024-04-04 VITALS — BP 136/73 | HR 70 | Temp 97.5°F | Ht 63.0 in | Wt 158.6 lb

## 2024-04-04 DIAGNOSIS — K625 Hemorrhage of anus and rectum: Secondary | ICD-10-CM | POA: Insufficient documentation

## 2024-04-04 MED ORDER — PEG 3350-KCL-NA BICARB-NACL 420 G PO SOLR: 4000.0000 mL | Freq: Once | ORAL | 0 refills | Status: AC

## 2024-04-04 MED ORDER — HYDROCORTISONE (PERIANAL) 2.5 % EX CREA: 1.0000 | TOPICAL_CREAM | Freq: Three times a day (TID) | CUTANEOUS | 1 refills | Status: AC

## 2024-04-04 NOTE — Telephone Encounter (Signed)
 Spoke with patient, scheduled TCS for 04/21/2024 at 1:45pm. Rx sent to pharmacy. Instructions mailed.

## 2024-04-04 NOTE — Telephone Encounter (Signed)
 PA on [facility] for TCS: Notification or Prior Authorization is not required for the requested services You are not required to submit a notification/prior authorization based on the information provided.  Decision ID #: [id number]

## 2024-04-04 NOTE — Progress Notes (Signed)
 "  Referring Provider: Orpha Yancey LABOR, MD Primary Care Physician:  Orpha Yancey LABOR, MD Primary GI Physician: Previously Dr. Eartha (Dr. Cinderella)   Chief Complaint  Patient presents with   Rectal Bleeding    Pt arrives due to rectal bleeding. Pt seen blood in December. BRB. Blood dripping from rectum to toilet. Saw blood on toilet paper also. Pt states she had a bad fall-fell on back concussion, fractured vertebrae and injury to ribs. After the fall is when she began to see the blood. Pt had TCS in 2019. Has seen blood this month but not as much as in December.   HPI:   Alexis Ortega is a 79 y.o. female with past medical history of  depression, FBM, hypothyroidism, HTN, multiple back surgeries, and history of Schatzki's ring   Patient presenting today for:  Rectal bleeding  Last seen 12/2022, at that time constipation improved with miralax, taking suboxone as needed for back pain, daily BMs, issues with hemorrhoids but no bleeding since constipation improved, using prep H PRN.   Recommended anusol  QID, good water  intake, avoid straining, limit toilet time, pt to make me aware if hemorrhoids not improving   Last labs with hgb 11.7,CMP unremarkable in December TSH in sept was 0.223  Present:  She states in December she climbed up in a chair to get something and backwards out of a chair and onto her back fracturing a vertebrae and suffering a concussion. She notes some time after her fall she noted dripping of blood from he rectum. She notes a lot of blood on toilet tissue. She states initially went to urinate when she noted the rectal bleeding. She notes she continued to see bleeding for about 3-4 days after onset. No abdominal pain. She last noted rectal bleeding earlier this month though lighter in volume. Has had a little constipation this week but nothing severe, denies melena, no changes in appetite or weight loss. Denies recta itching, she notes some rectal discomfort with defecation at  times but not sharp pain. Has felt sensation of hemorrhoids at times, previously used some cream but recently ran out. Taking metamucil and reports good water  intake   Last Colonoscopy:12/2017-dr cathey-hemorrhoids/no polyps  Last Endoscopy: 2022  - Non-obstructing Schatzki ring. Dilated.                           - 1 cm hiatal hernia.                           - Nodular mucosa in the gastric body. Biopsied.                           - Normal examined duodenum.  Past Medical History:  Diagnosis Date   Arthritis    Asthma    Depression    Fibromyalgia    GERD (gastroesophageal reflux disease)    Hypertension    Hypothyroidism    Thyroid  disease     Past Surgical History:  Procedure Laterality Date   ABDOMINAL HYSTERECTOMY     BACK SURGERY     BIOPSY  03/29/2020   Procedure: BIOPSY;  Surgeon: Eartha Angelia Sieving, MD;  Location: [facility];  Service: Gastroenterology;;   CARPAL TUNNEL RELEASE Right    CHOLECYSTECTOMY     CLOSED REDUCTION RADIAL SHAFT Left 06/29/2022   Procedure: ATTTEMPTED CLOSED REDUCTION RADIAL SHAFT;  Surgeon: Onesimo Oneil LABOR, MD;  Location: [facility];  Service: Orthopedics;  Laterality: Left;  Closed reduction left distal radius fracture   ESOPHAGEAL DILATION N/A 03/29/2020   Procedure: ESOPHAGEAL DILATION;  Surgeon: Eartha Angelia Sieving, MD;  Location: [facility];  Service: Gastroenterology;  Laterality: N/A;   ESOPHAGOGASTRODUODENOSCOPY (EGD) WITH PROPOFOL  N/A 03/29/2020   Procedure: ESOPHAGOGASTRODUODENOSCOPY (EGD) WITH PROPOFOL ;  Surgeon: Eartha Angelia Sieving, MD;  Location: [facility];  Service: Gastroenterology;  Laterality: N/A;  9:00   EYE SURGERY     cataract with lens implant   KNEE ARTHROSCOPY Left    LEFT HEART CATHETERIZATION WITH CORONARY ANGIOGRAM N/A 10/02/2013   Procedure: LEFT HEART CATHETERIZATION WITH CORONARY ANGIOGRAM;  Surgeon: Dorn JINNY Lesches, MD;  Location: [facility] CATH LAB;  Service: Cardiovascular;  Laterality: N/A;    NECK SURGERY     PERCUTANEOUS PINNING Left 06/29/2022   Procedure: PERCUTANEOUS PINNING OF DISTAL RADIUS;  Surgeon: Onesimo Oneil LABOR, MD;  Location: [facility];  Service: Orthopedics;  Laterality: Left;  Closed reduction and pinning of distal radius fracture   TOTAL KNEE ARTHROPLASTY Left 06/01/2012   Procedure: LEFT TOTAL KNEE ARTHROPLASTY;  Surgeon: Sieving JULIANNA Chancy, MD;  Location: [facility];  Service: Orthopedics;  Laterality: Left;   TUBAL LIGATION      Current Outpatient Medications  Medication Sig Dispense Refill   acetaminophen  (TYLENOL ) 500 MG tablet Take 500 mg by mouth every 6 (six) hours as needed for moderate pain or mild pain.     albuterol  (PROAIR  HFA) 108 (90 Base) MCG/ACT inhaler Inhale 2 puffs into the lungs every 4 (four) hours as needed for wheezing. 8.5 g 6   ALPRAZolam  (XANAX ) 0.5 MG tablet Take 0.5 mg by mouth at bedtime.     amLODipine  (NORVASC ) 10 MG tablet Take 10 mg by mouth daily.     citalopram  (CELEXA ) 20 MG tablet Take 20 mg by mouth daily.     DULoxetine (CYMBALTA) 20 MG capsule Take 20 mg by mouth daily.     hydrocortisone  (ANUSOL -HC) 2.5 % rectal cream Place 1 Application rectally 4 (four) times daily. 56 g 1   ipratropium-albuterol  (DUONEB) 0.5-2.5 (3) MG/3ML SOLN Take 3 mLs by nebulization every 6 (six) hours as needed. 540 mL 1   levothyroxine  (SYNTHROID ) 88 MCG tablet Take 88 mcg by mouth daily.     losartan  (COZAAR ) 100 MG tablet Take 100 mg by mouth daily.     Multiple Vitamin (MULTIVITAMIN WITH MINERALS) TABS tablet Take 1 tablet by mouth daily.     ondansetron  (ZOFRAN  ODT) 4 MG disintegrating tablet 4mg  ODT q4 hours prn nausea/vomit 12 tablet 0   pravastatin  (PRAVACHOL ) 40 MG tablet Take 40 mg by mouth daily.     predniSONE  (DELTASONE ) 10 MG tablet Take 6 tablets (60 mg total) by mouth daily with breakfast. And decrease by one tablet daily 21 tablet 0   Tiotropium Bromide (SPIRIVA  RESPIMAT) 2.5 MCG/ACT AERS Inhale 2 puffs into the lungs daily.     Tiotropium  Bromide (SPIRIVA  RESPIMAT) 2.5 MCG/ACT AERS Inhale 2 puffs into the lungs daily. 4 g 11   Zinc 50 MG CAPS Take 50 mg by mouth daily.     No current facility-administered medications for this visit.    Allergies as of 04/04/2024 - Review Complete 04/04/2024  Allergen Reaction Noted   Cefuroxime Shortness Of Breath 11/18/2022   Gabapentin Itching 12/17/2022   Diclofenac Rash    Tramadol  Itching 10/12/2011    Social History   Socioeconomic History  Marital status: Married    Spouse name: Not on file   Number of children: Not on file   Years of education: Not on file   Highest education level: Not on file  Occupational History   Not on file  Tobacco Use   Smoking status: Former    Current packs/day: 0.00    Average packs/day: 0.5 packs/day for 40.0 years (20.0 ttl pk-yrs)    Types: Cigarettes    Start date: 06/14/1972    Quit date: 06/14/2020    Years since quitting: 3.8    Passive exposure: Past   Smokeless tobacco: Never  Vaping Use   Vaping status: Never Used  Substance and Sexual Activity   Alcohol use: No   Drug use: Not Currently    Comment: fentanyl  patch Q three days   Sexual activity: Not Currently  Other Topics Concern   Not on file  Social History Narrative   Not on file   Social Drivers of Health   Tobacco Use: Medium Risk (03/23/2024)   Patient History    Smoking Tobacco Use: Former    Smokeless Tobacco Use: Never    Passive Exposure: Past  Physicist, Medical Strain: Not on file  Food Insecurity: No Food Insecurity (04/23/2023)   Hunger Vital Sign    Worried About Running Out of Food in the Last Year: Never true    Ran Out of Food in the Last Year: Never true  Transportation Needs: No Transportation Needs (04/23/2023)   PRAPARE - Administrator, Civil Service (Medical): No    Lack of Transportation (Non-Medical): No  Physical Activity: Not on file  Stress: Not on file  Social Connections: Socially Integrated (04/23/2023)   Social Connection  and Isolation Panel    Frequency of Communication with Friends and Family: More than three times a week    Frequency of Social Gatherings with Friends and Family: More than three times a week    Attends Religious Services: More than 4 times per year    Active Member of [employer] or Organizations: Yes    Attends Banker Meetings: More than 4 times per year    Marital Status: Married  Depression (PHQ2-9): Not on file  Alcohol Screen: Not on file  Housing: Low Risk (04/23/2023)   Housing Stability Vital Sign    Unable to Pay for Housing in the Last Year: No    Number of Times Moved in the Last Year: 0    Homeless in the Last Year: No  Utilities: Not At Risk (04/23/2023)   [facility] Utilities    Threatened with loss of utilities: No  Health Literacy: Not on file    Review of systems General: negative for malaise, night sweats, fever, chills, weight loss Neck: Negative for lumps, goiter, pain and significant neck swelling Resp: Negative for cough, wheezing, dyspnea at rest CV: Negative for chest pain, leg swelling, palpitations, orthopnea GI: denies melena, nausea, vomiting, diarrhea, constipation, dysphagia, odyonophagia, early satiety or unintentional weight loss. +hematochezia +rectal discomfort  MSK: Negative for joint pain or swelling, back pain, and muscle pain. Derm: Negative for itching or rash Psych: Denies depression, anxiety, memory loss, confusion. No homicidal or suicidal ideation.  Heme: Negative for prolonged bleeding, bruising easily, and swollen nodes. Endocrine: Negative for cold or heat intolerance, polyuria, polydipsia and goiter. Neuro: negative for tremor, gait imbalance, syncope and seizures. The remainder of the review of systems is noncontributory.  Physical Exam: There were no vitals taken for this visit.  General:   Alert and oriented. No distress noted. Pleasant and cooperative.  Head:  Normocephalic and atraumatic. Eyes:  Conjuctiva clear without scleral  icterus. Mouth:  Oral mucosa pink and moist. Good dentition. No lesions. Heart: Normal rate and rhythm, s1 and s2 heart sounds present.  Lungs: Clear lung sounds in all lobes. Respirations equal and unlabored. Abdomen:  +BS, soft, non-tender and non-distended. No rebound or guarding. No HSM or masses noted. Rectal: crystal sutton CMA, present as witness, no obvious lesions or masses noted externally, some purple discoloration of mucosa just at anal verge, unclear etiology, could be perianal hematoma? Though patient denies pain at the site, no obvious lesions or masses felt internally on DRE, sphincter tone WNL  Derm: No palmar erythema or jaundice Msk:  Symmetrical without gross deformities. Normal posture. Extremities:  Without edema. Neurologic:  Alert and  oriented x4 Psych:  Alert and cooperative. Normal mood and affect.  Invalid input(s): 6 MONTHS   ASSESSMENT: Alexis Ortega is a 79 y.o. female presenting today for rectal bleeding  New onset rectal bleeding after a fall in December, no evidence of internal bleeding or hematomas on cross sectional imaging at time of evaluation for her fall. She endorses 3-4 days of heavy rectal bleeding dripping into the toilet with out abdominal pain. Recurrent bleeding again earlier this month but lighter in volume. Occasional constipation but not often. History of hemorrhoids though no obvious ones on rectal exam today. Some purple discoloration of mucosa at anal verge, unclear if this is a perianal hematoma Or potentially a hemangioma? Could possibly be the source of bleeding. Last TCS in 2019. Given new onset of rectal bleeding recommend colonoscopy for further evaluation as I cannot rule out bleeding polyps, AVMs, malignancy. Will send anusol  to use in case this is just internal hemorrhoids. Indications, risks and benefits of procedure discussed in detail with patient. Patient verbalized understanding and is in agreement to proceed with Colonoscopy.      PLAN:  -schedule Colonoscopy, extended prep due to opiates, ASA III -keep stools nice and soft -Increase water  intake, aim for atleast 64 oz per day -Increase fruits, veggies and whole grains, kiwi and prunes are especially good for constipation -avoid straining and limit toilet time  -continue metamucil  All questions were answered, patient verbalized understanding and is in agreement with plan as outlined above.   Follow Up: 2 months   Zerrick Hanssen L. Adell Panek, MSN, APRN, AGNP-C Adult-Gerontology Nurse Practitioner [facility]  "

## 2024-04-04 NOTE — Patient Instructions (Signed)
-  schedule Colonoscopy -keep stools nice and soft -Increase water  intake, aim for atleast 64 oz per day Increase fruits, veggies and whole grains, kiwi and prunes are especially good for constipation -avoid straining  - have sent anusol  to use three times per day x7 days for possible hemorrhoids, then as needed thereafter  Follow up 2 months  It was a pleasure to see you today. I want to create trusting relationships with patients and provide genuine, compassionate, and quality care. I truly value your feedback! please be on the lookout for a survey regarding your visit with me today. I appreciate your input about our visit and your time in completing this!    Yilin Weedon L. Munachimso Rigdon, MSN, APRN, AGNP-C Adult-Gerontology Nurse Practitioner [facility]

## 2024-04-18 ENCOUNTER — Encounter (HOSPITAL_COMMUNITY): Payer: Self-pay

## 2024-04-18 ENCOUNTER — Encounter (HOSPITAL_COMMUNITY)
Admission: RE | Admit: 2024-04-18 | Discharge: 2024-04-18 | Disposition: A | Source: Ambulatory Visit | Attending: Gastroenterology

## 2024-04-18 HISTORY — DX: Chronic obstructive pulmonary disease, unspecified: J44.9

## 2024-04-18 NOTE — Telephone Encounter (Signed)
 Patient did not receive instructions in the mail for procedure in time to prep, rescheduled patient to 05/04/2024 at 1pm. Sending updated instructions through the mail.

## 2024-04-28 ENCOUNTER — Other Ambulatory Visit (HOSPITAL_COMMUNITY)

## 2024-05-04 ENCOUNTER — Encounter (HOSPITAL_COMMUNITY): Admission: RE | Payer: Self-pay | Source: Home / Self Care

## 2024-05-04 ENCOUNTER — Ambulatory Visit (HOSPITAL_COMMUNITY): Admission: RE | Admit: 2024-05-04 | Source: Home / Self Care | Admitting: Gastroenterology

## 2024-05-09 ENCOUNTER — Encounter (HOSPITAL_COMMUNITY)

## 2024-06-05 ENCOUNTER — Ambulatory Visit (INDEPENDENT_AMBULATORY_CARE_PROVIDER_SITE_OTHER): Admitting: Gastroenterology

## 2024-08-14 ENCOUNTER — Ambulatory Visit: Admitting: Pulmonary Disease
